# Patient Record
Sex: Female | Born: 1960 | Race: White | Hispanic: No | Smoking: Former smoker
Health system: Southern US, Community
[De-identification: ages and names within clinical notes are randomized; demographics above are authoritative.]

## PROBLEM LIST (undated history)

## (undated) DIAGNOSIS — D649 Anemia, unspecified: Secondary | ICD-10-CM

## (undated) DIAGNOSIS — C801 Malignant (primary) neoplasm, unspecified: Secondary | ICD-10-CM

## (undated) DIAGNOSIS — I219 Acute myocardial infarction, unspecified: Secondary | ICD-10-CM

## (undated) DIAGNOSIS — I5022 Chronic systolic (congestive) heart failure: Secondary | ICD-10-CM

## (undated) DIAGNOSIS — J189 Pneumonia, unspecified organism: Secondary | ICD-10-CM

## (undated) DIAGNOSIS — I2589 Other forms of chronic ischemic heart disease: Secondary | ICD-10-CM

## (undated) DIAGNOSIS — I10 Essential (primary) hypertension: Secondary | ICD-10-CM

## (undated) DIAGNOSIS — K219 Gastro-esophageal reflux disease without esophagitis: Secondary | ICD-10-CM

## (undated) DIAGNOSIS — E876 Hypokalemia: Secondary | ICD-10-CM

## (undated) DIAGNOSIS — E785 Hyperlipidemia, unspecified: Secondary | ICD-10-CM

## (undated) DIAGNOSIS — IMO0002 Reserved for concepts with insufficient information to code with codable children: Secondary | ICD-10-CM

## (undated) DIAGNOSIS — E119 Type 2 diabetes mellitus without complications: Secondary | ICD-10-CM

## (undated) DIAGNOSIS — K560 Paralytic ileus: Secondary | ICD-10-CM

## (undated) DIAGNOSIS — J449 Chronic obstructive pulmonary disease, unspecified: Secondary | ICD-10-CM

## (undated) DIAGNOSIS — I2510 Atherosclerotic heart disease of native coronary artery without angina pectoris: Secondary | ICD-10-CM

## (undated) HISTORY — DX: Hypokalemia: E87.6

## (undated) HISTORY — DX: Atherosclerotic heart disease of native coronary artery without angina pectoris: I25.10

## (undated) HISTORY — PX: ABDOMINAL HYSTERECTOMY: SHX81

## (undated) HISTORY — DX: Paralytic ileus: K56.0

## (undated) HISTORY — DX: Essential (primary) hypertension: I10

## (undated) HISTORY — PX: OTHER SURGICAL HISTORY: SHX169

## (undated) HISTORY — DX: Anemia, unspecified: D64.9

## (undated) HISTORY — DX: Other forms of chronic ischemic heart disease: I25.89

## (undated) HISTORY — PX: WISDOM TOOTH EXTRACTION: SHX21

## (undated) HISTORY — PX: CHOLECYSTECTOMY: SHX55

## (undated) HISTORY — DX: Gastro-esophageal reflux disease without esophagitis: K21.9

## (undated) HISTORY — PX: TOTAL ABDOMINAL HYSTERECTOMY W/ BILATERAL SALPINGOOPHORECTOMY: SHX83

## (undated) HISTORY — DX: Chronic systolic (congestive) heart failure: I50.22

## (undated) HISTORY — PX: BILATERAL SALPINGOOPHORECTOMY: SHX1223

## (undated) HISTORY — DX: Reserved for concepts with insufficient information to code with codable children: IMO0002

---

## 2006-10-18 HISTORY — PX: ESOPHAGOGASTRODUODENOSCOPY: SHX1529

## 2006-12-06 HISTORY — PX: COLONOSCOPY: SHX174

## 2008-05-15 DIAGNOSIS — M898X9 Other specified disorders of bone, unspecified site: Secondary | ICD-10-CM | POA: Insufficient documentation

## 2008-05-15 DIAGNOSIS — I2510 Atherosclerotic heart disease of native coronary artery without angina pectoris: Secondary | ICD-10-CM | POA: Insufficient documentation

## 2008-07-13 HISTORY — PX: CARDIAC CATHETERIZATION: SHX172

## 2008-07-19 ENCOUNTER — Ambulatory Visit: Payer: Self-pay | Admitting: Cardiovascular Disease

## 2008-07-20 ENCOUNTER — Observation Stay (HOSPITAL_COMMUNITY): Admission: EM | Admit: 2008-07-20 | Discharge: 2008-07-22 | Payer: Self-pay | Admitting: Emergency Medicine

## 2008-07-20 ENCOUNTER — Encounter: Payer: Self-pay | Admitting: Cardiology

## 2008-07-24 ENCOUNTER — Inpatient Hospital Stay (HOSPITAL_COMMUNITY): Admission: EM | Admit: 2008-07-24 | Discharge: 2008-07-31 | Payer: Self-pay | Admitting: Internal Medicine

## 2008-07-24 ENCOUNTER — Ambulatory Visit: Payer: Self-pay | Admitting: Internal Medicine

## 2008-07-25 ENCOUNTER — Encounter: Payer: Self-pay | Admitting: Internal Medicine

## 2008-08-12 DIAGNOSIS — D649 Anemia, unspecified: Secondary | ICD-10-CM

## 2008-08-12 DIAGNOSIS — K560 Paralytic ileus: Secondary | ICD-10-CM

## 2008-08-12 DIAGNOSIS — I255 Ischemic cardiomyopathy: Secondary | ICD-10-CM

## 2008-08-12 DIAGNOSIS — E876 Hypokalemia: Secondary | ICD-10-CM

## 2008-08-12 DIAGNOSIS — I498 Other specified cardiac arrhythmias: Secondary | ICD-10-CM | POA: Insufficient documentation

## 2008-08-12 DIAGNOSIS — K219 Gastro-esophageal reflux disease without esophagitis: Secondary | ICD-10-CM

## 2008-08-12 DIAGNOSIS — I10 Essential (primary) hypertension: Secondary | ICD-10-CM | POA: Insufficient documentation

## 2008-08-12 DIAGNOSIS — I2510 Atherosclerotic heart disease of native coronary artery without angina pectoris: Secondary | ICD-10-CM | POA: Insufficient documentation

## 2008-08-19 ENCOUNTER — Encounter: Payer: Self-pay | Admitting: Physician Assistant

## 2008-08-19 ENCOUNTER — Ambulatory Visit: Payer: Self-pay | Admitting: Cardiology

## 2008-08-19 DIAGNOSIS — R0989 Other specified symptoms and signs involving the circulatory and respiratory systems: Secondary | ICD-10-CM | POA: Insufficient documentation

## 2008-08-19 DIAGNOSIS — I2510 Atherosclerotic heart disease of native coronary artery without angina pectoris: Secondary | ICD-10-CM | POA: Insufficient documentation

## 2008-08-19 DIAGNOSIS — R12 Heartburn: Secondary | ICD-10-CM | POA: Insufficient documentation

## 2008-08-31 ENCOUNTER — Telehealth (INDEPENDENT_AMBULATORY_CARE_PROVIDER_SITE_OTHER): Payer: Self-pay

## 2008-09-09 ENCOUNTER — Telehealth: Payer: Self-pay | Admitting: Cardiovascular Disease

## 2008-09-10 ENCOUNTER — Telehealth: Payer: Self-pay | Admitting: Cardiovascular Disease

## 2008-09-11 ENCOUNTER — Encounter (INDEPENDENT_AMBULATORY_CARE_PROVIDER_SITE_OTHER): Payer: Self-pay

## 2008-09-14 ENCOUNTER — Telehealth: Payer: Self-pay | Admitting: Cardiovascular Disease

## 2008-09-25 ENCOUNTER — Telehealth: Payer: Self-pay | Admitting: Cardiovascular Disease

## 2008-09-30 ENCOUNTER — Encounter: Payer: Self-pay | Admitting: Cardiovascular Disease

## 2008-09-30 ENCOUNTER — Telehealth: Payer: Self-pay | Admitting: Cardiovascular Disease

## 2008-10-13 ENCOUNTER — Encounter: Payer: Self-pay | Admitting: Cardiovascular Disease

## 2008-10-15 ENCOUNTER — Encounter: Payer: Self-pay | Admitting: Cardiovascular Disease

## 2008-10-15 ENCOUNTER — Ambulatory Visit: Payer: Self-pay | Admitting: Cardiovascular Disease

## 2008-10-15 ENCOUNTER — Encounter: Admission: RE | Admit: 2008-10-15 | Discharge: 2008-10-15 | Payer: Self-pay | Admitting: Neurosurgery

## 2008-10-15 DIAGNOSIS — E785 Hyperlipidemia, unspecified: Secondary | ICD-10-CM

## 2008-10-19 DIAGNOSIS — I10 Essential (primary) hypertension: Secondary | ICD-10-CM | POA: Insufficient documentation

## 2008-10-21 ENCOUNTER — Telehealth: Payer: Self-pay | Admitting: Cardiovascular Disease

## 2008-10-21 ENCOUNTER — Encounter: Payer: Self-pay | Admitting: Cardiovascular Disease

## 2008-11-02 ENCOUNTER — Telehealth: Payer: Self-pay | Admitting: Cardiovascular Disease

## 2008-11-02 ENCOUNTER — Ambulatory Visit: Payer: Self-pay | Admitting: Internal Medicine

## 2008-11-02 DIAGNOSIS — R609 Edema, unspecified: Secondary | ICD-10-CM

## 2008-11-04 ENCOUNTER — Telehealth (INDEPENDENT_AMBULATORY_CARE_PROVIDER_SITE_OTHER): Payer: Self-pay | Admitting: *Deleted

## 2008-11-04 LAB — CONVERTED CEMR LAB
CO2: 26 meq/L (ref 19–32)
Chloride: 107 meq/L (ref 96–112)
Creatinine, Ser: 0.9 mg/dL (ref 0.4–1.2)
Potassium: 4.3 meq/L (ref 3.5–5.1)

## 2008-11-05 ENCOUNTER — Telehealth: Payer: Self-pay | Admitting: Internal Medicine

## 2008-11-11 ENCOUNTER — Ambulatory Visit: Payer: Self-pay | Admitting: Cardiovascular Disease

## 2008-11-11 ENCOUNTER — Ambulatory Visit: Payer: Self-pay

## 2008-11-11 ENCOUNTER — Encounter: Payer: Self-pay | Admitting: Cardiovascular Disease

## 2008-11-17 LAB — CONVERTED CEMR LAB
Chloride: 105 meq/L (ref 96–112)
Creatinine, Ser: 0.8 mg/dL (ref 0.4–1.2)
Glucose, Bld: 101 mg/dL — ABNORMAL HIGH (ref 70–99)
Potassium: 3.9 meq/L (ref 3.5–5.1)
Sodium: 139 meq/L (ref 135–145)

## 2008-12-03 ENCOUNTER — Encounter: Payer: Self-pay | Admitting: Cardiovascular Disease

## 2008-12-09 ENCOUNTER — Ambulatory Visit: Payer: Self-pay | Admitting: Cardiovascular Disease

## 2008-12-14 ENCOUNTER — Encounter: Payer: Self-pay | Admitting: Cardiovascular Disease

## 2008-12-16 ENCOUNTER — Encounter: Payer: Self-pay | Admitting: Cardiovascular Disease

## 2008-12-23 ENCOUNTER — Telehealth: Payer: Self-pay | Admitting: Cardiovascular Disease

## 2009-01-26 ENCOUNTER — Telehealth: Payer: Self-pay | Admitting: Cardiovascular Disease

## 2009-02-15 ENCOUNTER — Encounter: Payer: Self-pay | Admitting: Cardiovascular Disease

## 2009-02-15 ENCOUNTER — Ambulatory Visit: Payer: Self-pay

## 2009-02-15 ENCOUNTER — Ambulatory Visit: Payer: Self-pay | Admitting: Cardiovascular Disease

## 2009-02-15 ENCOUNTER — Ambulatory Visit (HOSPITAL_COMMUNITY): Admission: RE | Admit: 2009-02-15 | Discharge: 2009-02-15 | Payer: Self-pay | Admitting: Cardiovascular Disease

## 2009-02-16 ENCOUNTER — Encounter (INDEPENDENT_AMBULATORY_CARE_PROVIDER_SITE_OTHER): Payer: Self-pay | Admitting: *Deleted

## 2009-02-20 LAB — CONVERTED CEMR LAB
BUN: 10 mg/dL (ref 6–23)
Creatinine, Ser: 0.9 mg/dL (ref 0.4–1.2)
Glucose, Bld: 131 mg/dL — ABNORMAL HIGH (ref 70–99)

## 2009-02-24 ENCOUNTER — Ambulatory Visit (HOSPITAL_COMMUNITY): Admission: RE | Admit: 2009-02-24 | Discharge: 2009-02-24 | Payer: Self-pay | Admitting: Cardiovascular Disease

## 2009-02-24 ENCOUNTER — Ambulatory Visit: Payer: Self-pay | Admitting: Cardiovascular Disease

## 2009-03-01 ENCOUNTER — Telehealth (INDEPENDENT_AMBULATORY_CARE_PROVIDER_SITE_OTHER): Payer: Self-pay | Admitting: *Deleted

## 2009-03-04 ENCOUNTER — Encounter: Payer: Self-pay | Admitting: Cardiovascular Disease

## 2009-03-24 ENCOUNTER — Ambulatory Visit: Payer: Self-pay | Admitting: Internal Medicine

## 2009-04-13 ENCOUNTER — Encounter: Payer: Self-pay | Admitting: Internal Medicine

## 2009-04-14 DIAGNOSIS — Z95810 Presence of automatic (implantable) cardiac defibrillator: Secondary | ICD-10-CM

## 2009-04-14 HISTORY — PX: CARDIAC DEFIBRILLATOR PLACEMENT: SHX171

## 2009-04-14 HISTORY — DX: Presence of automatic (implantable) cardiac defibrillator: Z95.810

## 2009-05-04 ENCOUNTER — Ambulatory Visit: Payer: Self-pay | Admitting: Internal Medicine

## 2009-05-06 LAB — CONVERTED CEMR LAB
Basophils Absolute: 0 10*3/uL (ref 0.0–0.1)
Calcium: 9.1 mg/dL (ref 8.4–10.5)
Chloride: 102 meq/L (ref 96–112)
Eosinophils Absolute: 0.2 10*3/uL (ref 0.0–0.7)
Eosinophils Relative: 1.9 % (ref 0.0–5.0)
Hemoglobin: 11.8 g/dL — ABNORMAL LOW (ref 12.0–15.0)
INR: 1.1 — ABNORMAL HIGH (ref 0.8–1.0)
Lymphocytes Relative: 23.4 % (ref 12.0–46.0)
Lymphs Abs: 2.3 10*3/uL (ref 0.7–4.0)
MCHC: 33.7 g/dL (ref 30.0–36.0)
MCV: 94 fL (ref 78.0–100.0)
Monocytes Absolute: 0.5 10*3/uL (ref 0.1–1.0)
Potassium: 4.3 meq/L (ref 3.5–5.1)
RBC: 3.71 M/uL — ABNORMAL LOW (ref 3.87–5.11)
RDW: 13.2 % (ref 11.5–14.6)

## 2009-05-11 ENCOUNTER — Ambulatory Visit: Payer: Self-pay | Admitting: Internal Medicine

## 2009-05-11 ENCOUNTER — Inpatient Hospital Stay (HOSPITAL_COMMUNITY): Admission: RE | Admit: 2009-05-11 | Discharge: 2009-05-12 | Payer: Self-pay | Admitting: Internal Medicine

## 2009-05-12 ENCOUNTER — Encounter: Payer: Self-pay | Admitting: Internal Medicine

## 2009-05-18 ENCOUNTER — Encounter: Payer: Self-pay | Admitting: Internal Medicine

## 2009-05-18 ENCOUNTER — Ambulatory Visit: Payer: Self-pay | Admitting: Cardiovascular Disease

## 2009-05-18 ENCOUNTER — Encounter (INDEPENDENT_AMBULATORY_CARE_PROVIDER_SITE_OTHER): Payer: Self-pay

## 2009-05-24 ENCOUNTER — Telehealth (INDEPENDENT_AMBULATORY_CARE_PROVIDER_SITE_OTHER): Payer: Self-pay | Admitting: *Deleted

## 2009-05-31 ENCOUNTER — Telehealth (INDEPENDENT_AMBULATORY_CARE_PROVIDER_SITE_OTHER): Payer: Self-pay | Admitting: *Deleted

## 2009-06-10 ENCOUNTER — Telehealth: Payer: Self-pay | Admitting: Internal Medicine

## 2009-06-11 ENCOUNTER — Telehealth: Payer: Self-pay | Admitting: Cardiovascular Disease

## 2009-06-15 ENCOUNTER — Telehealth: Payer: Self-pay | Admitting: Cardiovascular Disease

## 2009-06-17 ENCOUNTER — Telehealth (INDEPENDENT_AMBULATORY_CARE_PROVIDER_SITE_OTHER): Payer: Self-pay | Admitting: *Deleted

## 2009-06-21 ENCOUNTER — Ambulatory Visit (HOSPITAL_COMMUNITY): Admission: RE | Admit: 2009-06-21 | Discharge: 2009-06-21 | Payer: Self-pay | Admitting: Obstetrics and Gynecology

## 2009-06-22 ENCOUNTER — Telehealth: Payer: Self-pay | Admitting: Internal Medicine

## 2009-06-29 ENCOUNTER — Telehealth: Payer: Self-pay | Admitting: Cardiovascular Disease

## 2009-06-30 ENCOUNTER — Encounter: Payer: Self-pay | Admitting: Cardiovascular Disease

## 2009-07-08 ENCOUNTER — Telehealth: Payer: Self-pay | Admitting: Cardiovascular Disease

## 2009-07-08 ENCOUNTER — Ambulatory Visit: Admission: RE | Admit: 2009-07-08 | Discharge: 2009-07-08 | Payer: Self-pay | Admitting: Gynecologic Oncology

## 2009-07-14 ENCOUNTER — Telehealth (INDEPENDENT_AMBULATORY_CARE_PROVIDER_SITE_OTHER): Payer: Self-pay | Admitting: *Deleted

## 2009-07-15 ENCOUNTER — Ambulatory Visit: Payer: Self-pay

## 2009-07-15 ENCOUNTER — Ambulatory Visit: Payer: Self-pay | Admitting: Internal Medicine

## 2009-07-15 ENCOUNTER — Encounter (HOSPITAL_COMMUNITY): Admission: RE | Admit: 2009-07-15 | Discharge: 2009-09-14 | Payer: Self-pay | Admitting: Cardiovascular Disease

## 2009-08-10 ENCOUNTER — Inpatient Hospital Stay (HOSPITAL_COMMUNITY): Admission: RE | Admit: 2009-08-10 | Discharge: 2009-08-13 | Payer: Self-pay | Admitting: Obstetrics and Gynecology

## 2009-08-10 ENCOUNTER — Encounter (INDEPENDENT_AMBULATORY_CARE_PROVIDER_SITE_OTHER): Payer: Self-pay | Admitting: Obstetrics and Gynecology

## 2009-08-17 ENCOUNTER — Ambulatory Visit: Payer: Self-pay | Admitting: Cardiovascular Disease

## 2009-08-18 ENCOUNTER — Ambulatory Visit: Admission: RE | Admit: 2009-08-18 | Discharge: 2009-08-18 | Payer: Self-pay | Admitting: Gynecology

## 2009-08-20 ENCOUNTER — Ambulatory Visit: Payer: Self-pay | Admitting: Internal Medicine

## 2009-08-23 ENCOUNTER — Encounter: Payer: Self-pay | Admitting: Cardiovascular Disease

## 2009-08-24 ENCOUNTER — Encounter: Payer: Self-pay | Admitting: Cardiovascular Disease

## 2009-09-16 ENCOUNTER — Ambulatory Visit: Admission: RE | Admit: 2009-09-16 | Discharge: 2009-09-16 | Payer: Self-pay | Admitting: Gynecologic Oncology

## 2009-09-17 ENCOUNTER — Ambulatory Visit: Payer: Self-pay | Admitting: Cardiovascular Disease

## 2009-09-28 LAB — CONVERTED CEMR LAB
Cholesterol: 140 mg/dL (ref 0–200)
Direct LDL: 71.4 mg/dL
HDL: 31.8 mg/dL — ABNORMAL LOW (ref >39.00)
Total CHOL/HDL Ratio: 4
Triglycerides: 296 mg/dL — ABNORMAL HIGH (ref 0.0–149.0)

## 2009-11-16 ENCOUNTER — Telehealth (INDEPENDENT_AMBULATORY_CARE_PROVIDER_SITE_OTHER): Payer: Self-pay | Admitting: *Deleted

## 2009-11-30 ENCOUNTER — Encounter: Payer: Self-pay | Admitting: Cardiovascular Disease

## 2009-12-01 ENCOUNTER — Encounter: Payer: Self-pay | Admitting: Cardiovascular Disease

## 2009-12-06 ENCOUNTER — Ambulatory Visit: Payer: Self-pay | Admitting: Cardiovascular Disease

## 2009-12-06 ENCOUNTER — Encounter: Payer: Self-pay | Admitting: Internal Medicine

## 2009-12-31 ENCOUNTER — Emergency Department (HOSPITAL_COMMUNITY): Admission: EM | Admit: 2009-12-31 | Discharge: 2009-12-31 | Payer: Self-pay | Admitting: Emergency Medicine

## 2010-03-02 ENCOUNTER — Ambulatory Visit: Payer: Self-pay | Admitting: Internal Medicine

## 2010-03-05 IMAGING — CR DG CHEST 2V
2 series · 2 of 2 positions shown · non-contrast
Comparison: 07/19/2008

CLINICAL DATA: Post pacemaker placement.

CHEST - 2 VIEW

[w chest pa]
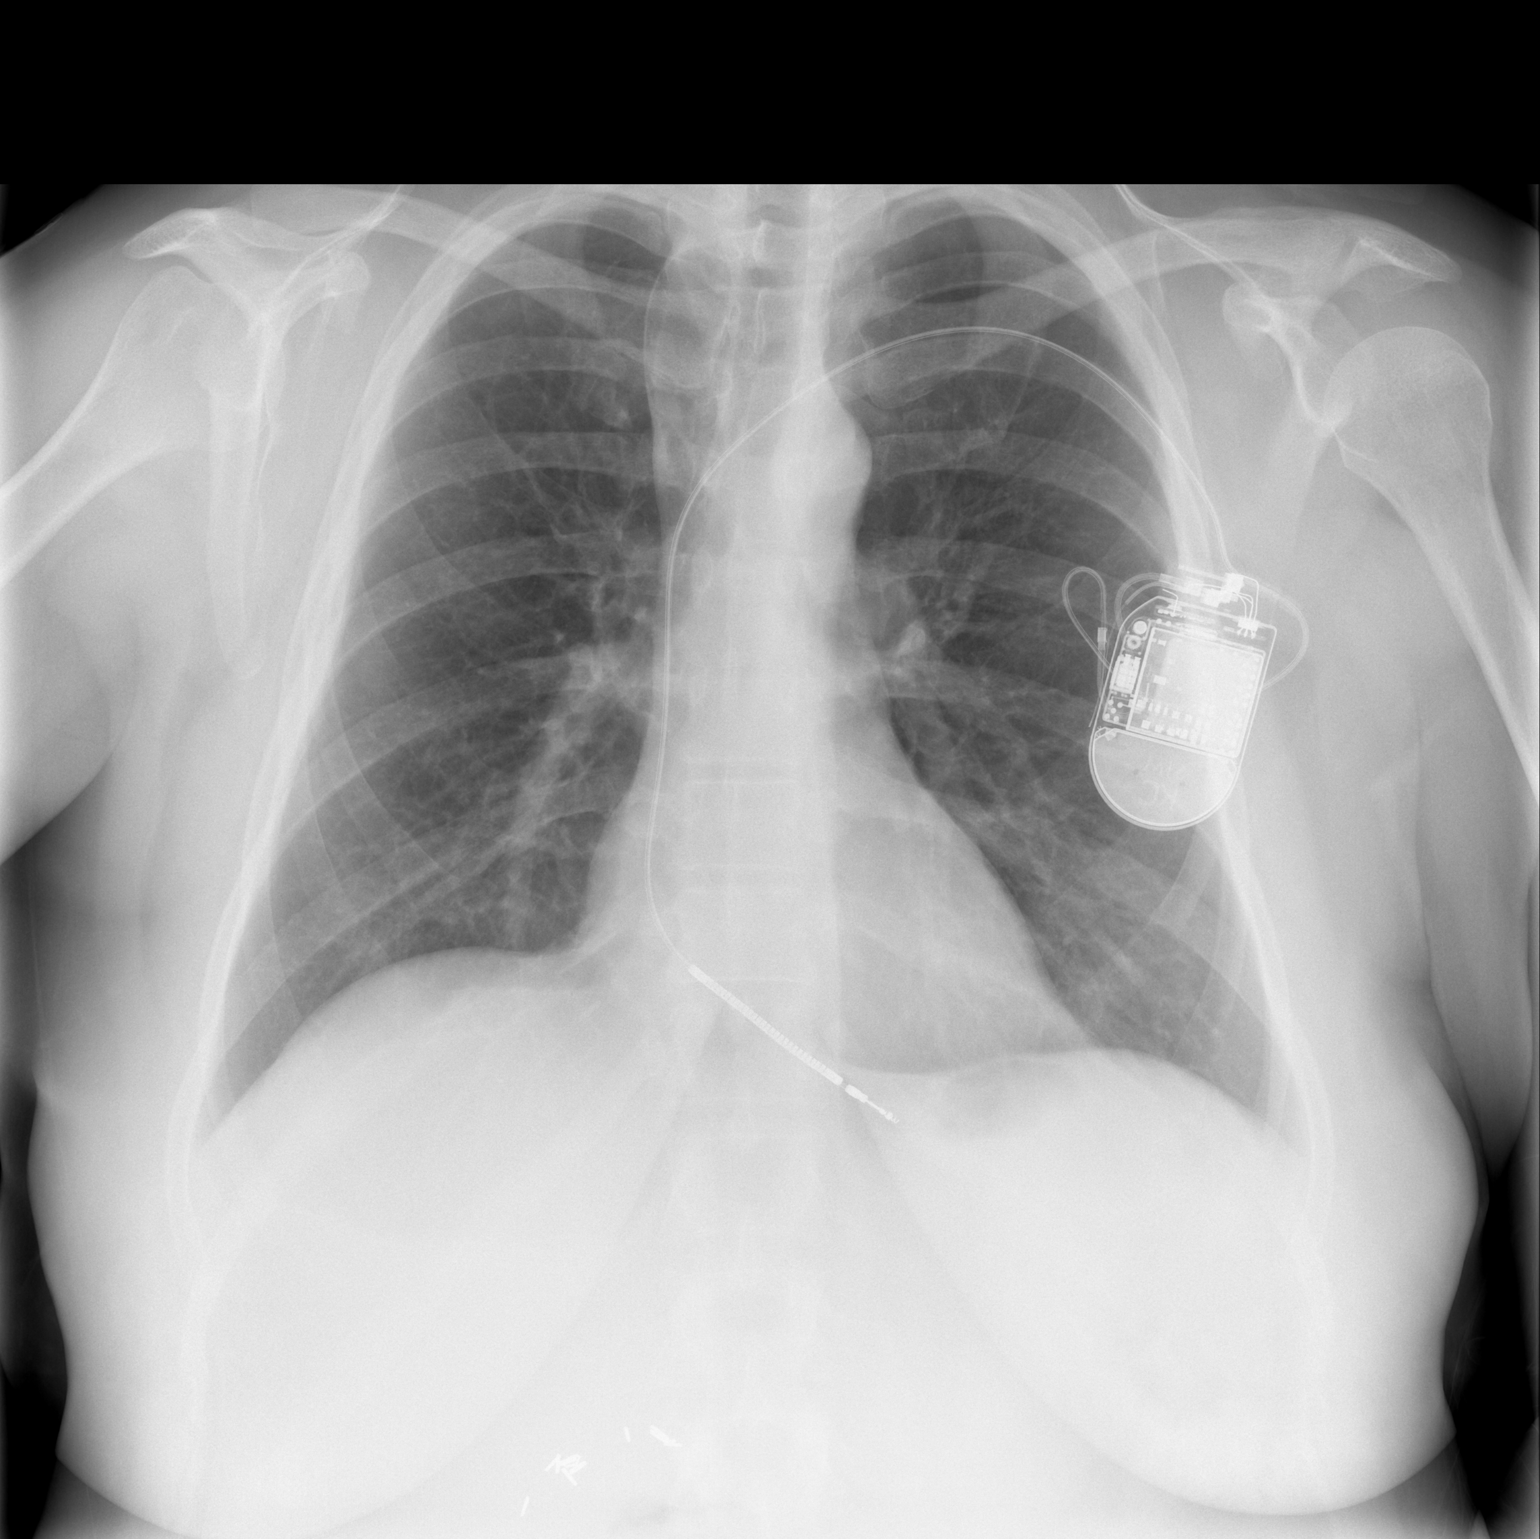

[w chest lat]
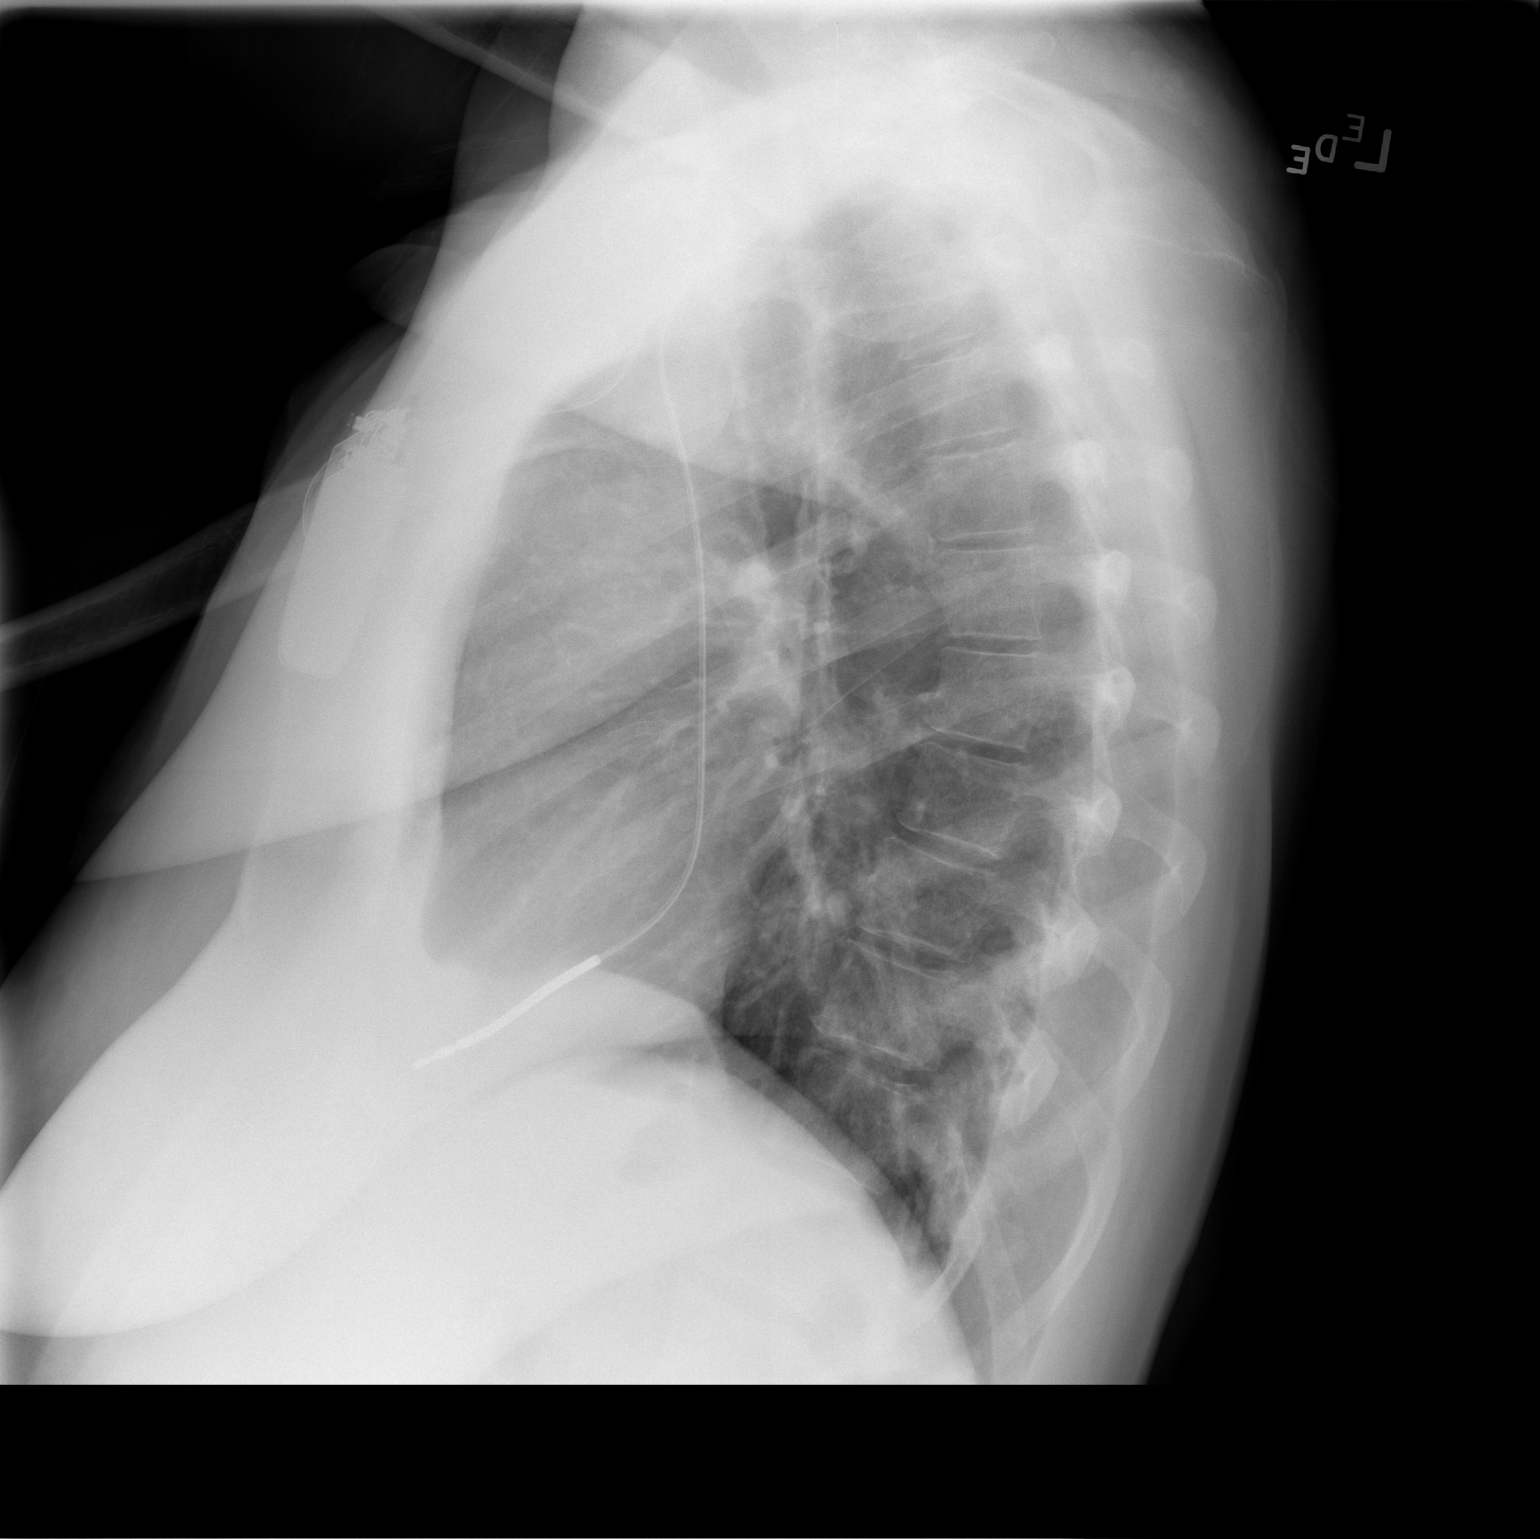

[2 of 2 positions shown; findings below may reference images not displayed]

FINDINGS: Left single lead pacer is in place with lead in the right
ventricle.  No pneumothorax.  Lungs are clear.  Heart is normal
size.  No effusions.  Incidentally noted is an azygos lobe medially
in the right upper lung.
IMPRESSION: No acute findings.  No pneumothorax post pacer placement.

## 2010-03-17 ENCOUNTER — Ambulatory Visit: Admission: RE | Admit: 2010-03-17 | Discharge: 2010-03-17 | Payer: Self-pay | Admitting: Gynecologic Oncology

## 2010-03-17 ENCOUNTER — Encounter: Payer: Self-pay | Admitting: Cardiovascular Disease

## 2010-04-04 ENCOUNTER — Encounter: Payer: Self-pay | Admitting: Cardiovascular Disease

## 2010-04-04 ENCOUNTER — Ambulatory Visit: Payer: Self-pay | Admitting: Cardiovascular Disease

## 2010-04-06 LAB — CONVERTED CEMR LAB
Alkaline Phosphatase: 74 units/L (ref 39–117)
Cholesterol: 117 mg/dL (ref 0–200)
Total CHOL/HDL Ratio: 4
Total Protein: 6.9 g/dL (ref 6.0–8.3)

## 2010-04-17 ENCOUNTER — Emergency Department (HOSPITAL_COMMUNITY)
Admission: EM | Admit: 2010-04-17 | Discharge: 2010-04-17 | Payer: Self-pay | Source: Home / Self Care | Admitting: Emergency Medicine

## 2010-04-21 ENCOUNTER — Emergency Department (HOSPITAL_COMMUNITY): Admission: EM | Admit: 2010-04-21 | Discharge: 2009-07-29 | Payer: Self-pay | Admitting: Emergency Medicine

## 2010-06-02 ENCOUNTER — Encounter: Payer: Self-pay | Admitting: Internal Medicine

## 2010-06-02 ENCOUNTER — Ambulatory Visit: Admission: RE | Admit: 2010-06-02 | Discharge: 2010-06-02 | Payer: Self-pay | Source: Home / Self Care

## 2010-06-16 NOTE — Progress Notes (Signed)
Summary: RE PHONE PACER CHECK-PT DOESN'T HAVE A TRANSMITTER  Phone Note Call from Patient   Caller: Patient Reason for Call: Talk to Nurse Summary of Call: pt states she recieved a call from our number,she doesn't have a answering machine so there wasn't a message, thought we might have called to remind her of her appt-it is for a phone pacer check,however she states she doesn't have a transmitter, should she just come here for a check? pls call [phone] Initial call taken by: Glynda Jaeger,  November 16, 2009 1:32 PM  Follow-up for Phone Call        patient will call and schedule in office checks and we will cancel Merlin. Follow-up by: Altha Harm, LPN,  November 16, 1476 2:27 PM

## 2010-06-16 NOTE — Cardiovascular Report (Signed)
Summary: Office Visit   Office Visit   Imported By: Roderic Ovens 08/24/2009 16:40:58  _____________________________________________________________________  External Attachment:    Type:   Image     Comment:   External Document

## 2010-06-16 NOTE — Letter (Signed)
Summary: Return To Work  [employer], Main Office  [address]   [place], [place] [address]   Phone: [phone]  Fax: [phone]    05/18/2009  TO: WHOM IT MAY CONCERN   RE: Emily Mays [address] [place]   The above named individual is under my medical care and may return to work on: Monday May 24, 2009 with restrictions.  The patient cannot lift greater than 10 pounds the week of January 10-16, 2011.  The patient cannot lift greater than 20 pounds the week of January 17-23, 2011.  The patient can resume normal job functions on June 07, 2009.  If you have any further questions or need additional information, please call.     Sincerely,    Veverly Fells. Excell Seltzer MD Julieta Gutting, RN, BSN

## 2010-06-16 NOTE — Progress Notes (Signed)
Summary: lifiting restriction   Phone Note Call from Patient Call back at Home Phone [phone]   Caller: Patient Reason for Call: Talk to Nurse Details for Reason: pacermaker put in 05/11/09. is pt still on lifting restriction  Initial call taken by: Lorne Skeens,  June 10, 2009 11:47 AM  Follow-up for Phone Call        no restrictions on wt at this time. device was placed 05/11/2009 Follow-up by: Charolotte Capuchin, RN

## 2010-06-16 NOTE — Consult Note (Signed)
Summary: [facility]   Imported By: Roderic Ovens 03/28/2010 09:28:13  _____________________________________________________________________  External Attachment:    Type:   Image     Comment:   External Document

## 2010-06-16 NOTE — Progress Notes (Signed)
Summary: what was her sugar level when defib was put in  Phone Note Call from Patient Call back at Home Phone [phone]   Caller: Patient Reason for Call: Talk to Nurse, Talk to Doctor Summary of Call: pt had a D & C done and her sugar level was high and she was wondering was it high when she had her defib put in Initial call taken by: Omer Jack,  June 22, 2009 1:23 PM  Follow-up for Phone Call        blood sugar 172 but she was not fasting.  She is seeing her primary on Mon Dennis Bast, RN, BSN  June 22, 2009 1:34 PM

## 2010-06-16 NOTE — Cardiovascular Report (Signed)
Summary: Office Visit   Office Visit   Imported By: Roderic Ovens 05/27/2009 14:08:03  _____________________________________________________________________  External Attachment:    Type:   Image     Comment:   External Document

## 2010-06-16 NOTE — Progress Notes (Signed)
  Walk in Patient Form Recieved " patient left FMLA papers forwarded to [employer]. [facility]  May 24, 2009 2:35 PM

## 2010-06-16 NOTE — Assessment & Plan Note (Signed)
Summary: 4 month rov   Visit Type:  4 months follow up Referring Provider:  Tonny Bollman, MD Primary Provider:  Dr. Fredric Dine  CC:  No complaints.  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 She also required stenting of the LCx at that time. Post MI LVEF is in range of 30-35% and she underwent ICD implant January 2011. She presents today for followup evaluation.  She feels well overall. Complains of mild exercise intolerance but no frank dyspnea or chest pain. No edema, orthopnea, or PND.    Current Medications (verified): 1)  Plavix 75 Mg Tabs (Clopidogrel Bisulfate) .... One Daily 2)  Aspirin 81 Mg Tbec (Aspirin) .... Take One Tablet By Mouth Daily 3)  Nitrostat 0.4 Mg Subl (Nitroglycerin) .... As Needed 4)  Digoxin 0.25 Mg Tabs (Digoxin) .... Take One Daily 5)  Crestor 20 Mg Tabs (Rosuvastatin Calcium) .... Take One Tablet By Mouth Daily. 6)  Carvedilol 12.5 Mg Tabs (Carvedilol) .... Take One Tablet By Mouth Twice A Day 7)  Alprazolam 0.5 Mg Tabs (Alprazolam) .... As Needed 8)  Furosemide 40 Mg Tabs (Furosemide) .... Take One Tablet By Mouth Daily. 9)  Potassium Chloride Cr 10 Meq Cr-Caps (Potassium Chloride) .... Take One Tablet By Mouth Daily 10)  Oxycodone-Acetaminophen 5-325 Mg Tabs (Oxycodone-Acetaminophen) .... As Needed 11)  Diovan 40 Mg Tabs (Valsartan) .... Take One Tablet By Mouth Daily 12)  Glipizide 10 Mg Tabs (Glipizide) .... Take 1 Tablet By Mouth Once A Day 13)  Janumet 50-1000 Mg Tabs (Sitagliptin-Metformin Hcl) .... Take One Tab By Mouth Once Daily  Allergies: 1)  ! Pcn  Past History:  Past medical history reviewed for relevance to current acute and chronic problems.  Past Medical History: ANEMIA (ICD-285.9) ISCHEMIC CARDIOMYOPATHY (ICD-414.8)      a.  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  s/p MI 07/24/08 with PCI Endeavor DES to Prox. LAD and BMS to prox. LCX.      b.  relook cath 07/28/08 showing cont. patency of LAD and LCX. Systolic HF, chronic DDD  Review of Systems       Negative except as per HPI   Vital Signs:  Patient profile:   50 year old female Height:      64 inches Weight:      177.25 pounds BMI:     30.53 Pulse rate:   75 / minute Pulse rhythm:   regular Resp:     18 per minute BP sitting:   114 / 80  (left arm) Cuff size:   large  Vitals Entered By: Vikki Ports (April 04, 2010 9:11 AM)  Physical Exam  General:  Pt is alert and oriented, in no acute distress. HEENT: normal Neck: normal carotid upstrokes without bruits, JVP normal Lungs: CTA CV: RRR without murmur or gallop Abd: soft, NT, positive BS, no bruit, no organomegaly Ext: no clubbing, cyanosis, or edema. peripheral pulses 2+ and equal Skin: warm and dry without rash    EKG  Procedure date:  04/04/2010  Findings:      NSR with age-indeterminate anterior MI, unchanged from prior tracing   ICD Specifications Following MD:  Hillis Range, MD     Referring MD:  Excell Seltzer ICD Vendor:  St Jude     ICD Model Number:  1231-40     ICD Serial Number:  [id number] ICD DOI:  05/11/2009     ICD Implanting MD:  Hillis Range, MD  Lead 1:    Location: RV     DOI: 05/11/2009     Model #: [id number]     Serial #: [id number]     Status: active  Indications::  ICM   ICD Follow Up ICD Dependent:  No      Brady Parameters Mode VVI     Lower Rate Limit:  40      Tachy Zones VF:  200     VT:  171 (MONITOR)     Impression & Recommendations:  Problem # 1:  CAD, NATIVE VESSEL (ICD-414.01) Pt stable without angina or CHF symptoms. She is s/p ICD without device therapies. Will continue her current program and increase carvedilol to a goal dose of 25 mg two times a day.  Her updated medication list for this  problem includes:    Plavix 75 Mg Tabs (Clopidogrel bisulfate) ..... One daily    Aspirin 81 Mg Tbec (Aspirin) .Marland Kitchen... Take one tablet by mouth daily    Nitrostat 0.4 Mg Subl (Nitroglycerin) .Marland Kitchen... As needed    Carvedilol 25 Mg Tabs (Carvedilol) .Marland Kitchen... Take one tablet by mouth twice a day  Orders: EKG w/ Interpretation (93000) TLB-Lipid Panel (80061-LIPID) TLB-Hepatic/Liver Function Pnl (80076-HEPATIC)  Problem # 2:  HYPERLIPIDEMIA-MIXED (ICD-272.4) Check lipids and LFT's today. Continue Crestor 20 mg pending those results.  Her updated medication list for this problem includes:    Crestor 20 Mg Tabs (Rosuvastatin calcium) .Marland Kitchen... Take one tablet by mouth daily.  CHOL: 140 (09/17/2009)   HDL: 31.80 (09/17/2009)   TG: 296.0 (09/17/2009)  Orders: EKG w/ Interpretation (93000) TLB-Lipid Panel (80061-LIPID) TLB-Hepatic/Liver Function Pnl (80076-HEPATIC)  Problem # 3:  HYPERTENSION, BENIGN (ICD-401.1) Controlled.  Her updated medication list for this problem includes:    Aspirin 81 Mg Tbec (Aspirin) .Marland Kitchen... Take one tablet by mouth daily    Carvedilol 25 Mg Tabs (Carvedilol) .Marland Kitchen... Take one tablet by mouth twice a day    Furosemide 40 Mg Tabs (Furosemide) .Marland Kitchen... Take one tablet by mouth daily.    Diovan 40 Mg Tabs (Valsartan) .Marland Kitchen... Take one tablet by mouth daily  Orders: EKG w/ Interpretation (93000) TLB-Lipid Panel (80061-LIPID) TLB-Hepatic/Liver Function Pnl (80076-HEPATIC)  BP today: 114/80 Prior BP: 94/70 (03/02/2010)  Prior 10 Yr Risk Heart Disease: N/A (10/15/2008)  Labs Reviewed: K+: 4.3 (05/04/2009) Creat: : 0.9 (05/04/2009)   Chol: 140 (09/17/2009)   HDL: 31.80 (09/17/2009)   TG: 296.0 (09/17/2009)  Problem # 4:  ISCHEMIC CARDIOMYOPATHY (ICD-414.8) Pt on ARB, coreg, and digoxin. s/p ICD for severe LV dysfunction.  Her updated medication list for this problem includes:    Plavix 75 Mg Tabs (Clopidogrel bisulfate) ..... One daily    Aspirin 81 Mg Tbec (Aspirin) .Marland Kitchen... Take  one tablet by mouth daily    Nitrostat 0.4 Mg Subl (Nitroglycerin) .Marland Kitchen... As needed    Digoxin 0.25 Mg Tabs (Digoxin) .Marland Kitchen... Take one daily    Carvedilol 25 Mg Tabs (Carvedilol) .Marland Kitchen... Take one tablet by mouth twice a day    Furosemide 40 Mg Tabs (Furosemide) .Marland Kitchen... Take one tablet by mouth daily.    Diovan 40 Mg Tabs (Valsartan) .Marland Kitchen... Take one tablet by mouth daily  Patient Instructions: 1)  Your physician recommends that you have a FASTING Lipid and Liver Profile today.   2)  Your physician has recommended you make the following change in  your medication: INCREASE Carvedilol to 25mg  two times a day  3)  Your physician wants you to follow-up in: 6 MONTHS.   You will receive a reminder letter in the mail two months in advance. If you don't receive a letter, please call our office to schedule the follow-up appointment. Prescriptions: CARVEDILOL 25 MG TABS (CARVEDILOL) Take one tablet by mouth twice a day  #60 x 8   Entered by:   Julieta Gutting, RN, BSN   Authorized by:   Norva Karvonen, MD   Signed by:   Julieta Gutting, RN, BSN on 04/04/2010   Method used:   Electronically to        [facility]  [address] [phone][facility] (retail)       [address]       [place], [place]  [address]       Ph: [phone] or [phone]       Fax: [phone]   RxID:   [id number]

## 2010-06-16 NOTE — Assessment & Plan Note (Signed)
Summary: Cardiology Nuclear Study  Nuclear Med Background Indications for Stress Test: Evaluation for Ischemia, Surgical Clearance, Stent Patency  Indications Comments: Pending Hysterectomy 08/03/09  History: Defibrillator, Echo, GXT, Heart Catheterization, Myocardial Infarction, Stents  History Comments: 3/10 AWMI secondary to LAD Dissection> Stents-LAD, CFX; 3/10 ECHO:EF=40-50%; 6/10 NWG:NFAOZHYQ; 12/10 ICD  Symptoms: DOE    Nuclear Pre-Procedure Cardiac Risk Factors: Family History - CAD, History of Smoking, Hypertension, Lipids, NIDDM Caffeine/Decaff Intake: None NPO After: 8:00 PM Lungs: Clear.  O2 Sat 98% on RA. IV 0.9% NS with Angio Cath: 20g     IV Site: (R) AC IV Started by: Irean Hong RN Chest Size (in) 40     Cup Size DD     Height (in): 64 Weight (lb): 179 BMI: 30.84  Nuclear Med Study 1 or 2 day study:  1 day     Stress Test Type:  Adenosine Reading MD:  Dietrich Pates, MD     Referring MD:  Tonny Bollman, MD Resting Radionuclide:  Technetium 58m Tetrofosmin     Resting Radionuclide Dose:  11.0 mCi  Stress Radionuclide:  Technetium 72m Tetrofosmin     Stress Radionuclide Dose:  33.0 mCi   Stress Protocol  Dose of Adenosine:  45.6 mg    Stress Test Technologist:  [employer]a [employer] CMA-N     Nuclear Technologist:  Harlow Asa CNMT  Rest Procedure  Myocardial perfusion imaging was performed at rest 45 minutes following the intravenous administration of Myoview Technetium 63m Tetrofosmin.  Stress Procedure  The patient received IV adenosine at 140 mcg/kg/min for 4 minutes. There were episodes of 2nd degree AVB with infusion. Myoview was injected at the 2 minute mark and quantitative spect images were obtained after a 45 minute delay.  QPS Raw Data Images:  Extensive soft tissue (breast, diaphragm, bowel acitvity) surround heart. Stress Images:  Large defect in the anterior wall (mid,distal), anteroseptal wall (base, mid, distal), anterolateral wall (distal),  inferolateral wall (distal), inferior wall (mid, distal) and apex. Rest Images:  No significant change from the stress images. Subtraction (SDS):  Normal Transient Ischemic Dilatation:  .95  (Normal <1.22)  Lung/Heart Ratio:  .40  (Normal <0.45)  Quantitative Gated Spect Images QGS EDV:  133 ml QGS ESV:  80 ml QGS EF:  40 %   Overall Impression  Exercise Capacity: Adenosine study with no exercise. BP Response: Normal blood pressure response. Clinical Symptoms: No chest pain ECG Impression: No significant ST segment change suggestive of ischemia. Overall Impression Comments: Extensive scar in the anterior, anterolateral, anteroseptal, inferior, inferolateral and apical walls as noted above.  No ischemia.  LVEF on gating calculated at 40%.  Would recommend echo if not done already to better define wall motion, LVEF.  Appended Document: Cardiology Nuclear Study myoview shows scar from prior infarct, but no ischemia. OK to proceed with surgery.  Appended Document: Cardiology Nuclear Study Pt aware of results by phone.  Pt does not know the details of pending surgery.  I made the pt aware that our office has not received any documentation requesting clearance for surgery.  The pt is also interested in doing Cardiac Rehab at [facility] after recovering from surgery.  The pt will call the office once she knows the details of her surgery.

## 2010-06-16 NOTE — Assessment & Plan Note (Signed)
Summary: 3 month rov   Visit Type:  3 months follow up Referring Provider:  Tonny Bollman, MD Primary Provider:  Dr. Fredric Dine  CC:  No complains.  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 She also required stenting of the LCx at that time. Post Mi LVEF is in range of 30-35% and she underwent ICD implant last week. Today is doing well and has no complaints. The patient denies chest pain, dyspnea, orthopnea, PND, edema, palpitations, lightheadedness, or syncope.   Current Medications (verified): 1)  Plavix 75 Mg Tabs (Clopidogrel Bisulfate) .... One Daily 2)  Fish Oil Concentrate 1000 Mg Caps (Omega-3 Fatty Acids) .... One Daily 3)  Aspirin 325 Mg Tabs (Aspirin) .... Take 1 Tab By Mouth Every Day 4)  Nitrostat 0.4 Mg Subl (Nitroglycerin) .... As Needed 5)  Digoxin 0.25 Mg Tabs (Digoxin) .... Take One Daily 6)  Famotidine 40 Mg Tabs (Famotidine) .... Take One Tablet By Mouth Twice A Day 7)  Crestor 40 Mg Tabs (Rosuvastatin Calcium) .... One Daily 8)  Carvedilol 6.25 Mg Tabs (Carvedilol) .... Take One Tablet By Mouth Twice A Day 9)  Alprazolam 0.5 Mg Tabs (Alprazolam) .... As Needed 10)  Furosemide 40 Mg Tabs (Furosemide) .... Take One Tablet By Mouth Daily. 11)  Potassium Chloride Cr 10 Meq Cr-Caps (Potassium Chloride) .... Take One Tablet By Mouth Daily 12)  Oxycodone-Acetaminophen 5-325 Mg Tabs (Oxycodone-Acetaminophen) .... As Needed 13)  Diovan 40 Mg Tabs (Valsartan) .... Take One Tablet By Mouth Daily 14)  Ciprofloxacin Hcl 500 Mg Tabs (Ciprofloxacin Hcl) .... Take 1 Tablet By Mouth Two Times A Day For 7 Days  Allergies: 1)  ! Pcn  Past History:  Past medical history reviewed for relevance to current acute and chronic problems.  Past Medical History: Reviewed history from 03/24/2009 and no changes required. 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  Echo 07/26/2008 EF 40-50%  with perapical HK HYPOKALEMIA (ICD-276.8) HYPERTENSION (ICD-401.9) GERD (ICD-530.81) ILEUS (ICD-560.1) CAD (ICD-414.00)      a.  s/p MI 07/24/08 with PCI Endeavor DES to Prox. LAD and BMS to prox. LCX.      b.  relook cath 07/28/08 showing cont. patency of LAD and LCX. LOWER EXTREMITY EDEMA DDD  Review of Systems       positive for vaginal bleeding, otherwise negative except as per HPI   Vital Signs:  Patient profile:   50 year old female Height:      64 inches Weight:      189.50 pounds BMI:     32.65 Pulse rate:   88 / minute Pulse rhythm:   regular Resp:     18 per minute BP sitting:   95 / 70  (right arm) Cuff size:   large  Vitals Entered By: Vikki Ports (May 18, 2009 10:41 AM)  Physical Exam  General:  Pt is alert and oriented, in no acute distress. HEENT: normal Neck: normal carotid upstrokes without bruits, JVP normal Lungs: CTA chest: ICD site intact, steri-strips in place without erythema CV: RRR without murmur or gallop Abd: soft, NT, positive BS, no bruit, no organomegaly Ext: no clubbing, cyanosis, or edema. peripheral pulses 2+ and equal Skin: warm and dry without rash    EKG  Procedure date:  05/18/2009  Findings:      NSR with septal MI age-indeterminate, HR 88 bpm   ICD Specifications Following MD:  Fayrene Fearing  Allred, MD     Referring MD:  Kynsie Falkner ICD Vendor:  St Jude     ICD Model Number:  [id number]     ICD Serial Number:  [id number] ICD DOI:  05/11/2009     ICD Implanting MD:  Hillis Range, MD  Lead 1:    Location: RV     DOI: 05/11/2009     Model #: [id number]     Serial #: [id number]     Status: active  Indications::  ICM   Impression & Recommendations:  Problem # 1:  ISCHEMIC CARDIOMYOPATHY (ICD-414.8) Pt stable s/p ICD implant. Device interrogation shows increased HR average in 90's - increase carvedilol to 12.5 mg two times a day  Her updated medication list for this problem includes:    Plavix 75 Mg Tabs (Clopidogrel bisulfate) ..... One  daily    Aspirin 325 Mg Tabs (Aspirin) .Marland Kitchen... Take 1 tab by mouth every day    Nitrostat 0.4 Mg Subl (Nitroglycerin) .Marland Kitchen... As needed    Digoxin 0.25 Mg Tabs (Digoxin) .Marland Kitchen... Take one daily    Carvedilol 12.5 Mg Tabs (Carvedilol) .Marland Kitchen... Take one tablet by mouth twice a day    Furosemide 40 Mg Tabs (Furosemide) .Marland Kitchen... Take one tablet by mouth daily.    Diovan 40 Mg Tabs (Valsartan) .Marland Kitchen... Take one tablet by mouth daily  Problem # 2:  CAD, NATIVE VESSEL (ICD-414.01) stable post-MI without angina. check myoview stress in March 2011 when she is one year out from her infarct because of high-risk coronary anatomy following stenting of both LAD and LCx.  Her updated medication list for this problem includes:    Plavix 75 Mg Tabs (Clopidogrel bisulfate) ..... One daily    Aspirin 325 Mg Tabs (Aspirin) .Marland Kitchen... Take 1 tab by mouth every day    Nitrostat 0.4 Mg Subl (Nitroglycerin) .Marland Kitchen... As needed    Carvedilol 12.5 Mg Tabs (Carvedilol) .Marland Kitchen... Take one tablet by mouth twice a day  Orders: EKG w/ Interpretation (93000) Nuclear Stress Test (Nuc Stress Test)  Problem # 3:  HYPERTENSION, BENIGN (ICD-401.1) stable on current meds Her updated medication list for this problem includes:    Aspirin 325 Mg Tabs (Aspirin) .Marland Kitchen... Take 1 tab by mouth every day    Carvedilol 12.5 Mg Tabs (Carvedilol) .Marland Kitchen... Take one tablet by mouth twice a day    Furosemide 40 Mg Tabs (Furosemide) .Marland Kitchen... Take one tablet by mouth daily.    Diovan 40 Mg Tabs (Valsartan) .Marland Kitchen... Take one tablet by mouth daily  Orders: EKG w/ Interpretation (93000) Nuclear Stress Test (Nuc Stress Test)  BP today: 95/70 Prior BP: 106/78 (03/24/2009)  Prior 10 Yr Risk Heart Disease: N/A (10/15/2008)  Labs Reviewed: K+: 4.3 (05/04/2009) Creat: : 0.9 (05/04/2009)     Problem # 4:  HYPERLIPIDEMIA-MIXED (ICD-272.4) lipids followed by PCP  Her updated medication list for this problem includes:    Crestor 40 Mg Tabs (Rosuvastatin calcium) ..... One  daily  Patient Instructions: 1)  Your physician recommends that you schedule a follow-up appointment in: April 2011 with Dr Excell Seltzer and Dr Johney Frame 2)  Your physician has requested that you have an exercise stress myoview in March 2011.  For further information please visit [email or web address]  Please follow instruction sheet, as given. 3)  Your physician deems you medically cleared to return to work.  A return to work note was provided today. 4)  Your physician has recommended you make the following change in your medication: INCREASE Carvedilol to 12.5mg  one tablet by  mouth twice a day Prescriptions: CARVEDILOL 12.5 MG TABS (CARVEDILOL) Take one tablet by mouth twice a day  #60 x 11   Entered by:   Julieta Gutting, RN, BSN   Authorized by:   Norva Karvonen, MD   Signed by:   Julieta Gutting, RN, BSN on 05/18/2009   Method used:   [facility]ron[facility] to        [facility]  [address] [phone]* (retail)       [address]       [place], [place]  [address]       Ph: [phone] or [phone]       Fax: [phone]   RxID:   [id number]

## 2010-06-16 NOTE — Progress Notes (Signed)
  Recieved ROI through fax, forwarded to [employer].. is for Disability?Emily Mays  May 31, 2009 9:31 AM

## 2010-06-16 NOTE — Assessment & Plan Note (Signed)
Summary: Emily Mays   Visit Type:  Follow-up Referring Provider:  Tonny Bollman, MD Primary Provider:  Dr. Fredric Dine  CC:  No cardiac complaints..  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 She also required stenting of the LCx at that time. Post MI LVEF is in range of 30-35% and she underwent ICD implant January 2011. She presents today for followup evaluation.  She is doing well today without specific cardiac complaints. The patient denies chest pain, dyspnea, orthopnea, PND, edema, palpitations, lightheadedness, or syncope.  She does have fatigue and lots of stress related to work issues - she works 12 hour shifts. She is not engaged in regular exercise.     Current Medications (verified): 1)  Plavix 75 Mg Tabs (Clopidogrel Bisulfate) .... One Daily 2)  Fish Oil Concentrate 1000 Mg Caps (Omega-3 Fatty Acids) .... One Daily 3)  Aspirin 325 Mg Tabs (Aspirin) .... Take 1 Tab By Mouth Every Day 4)  Nitrostat 0.4 Mg Subl (Nitroglycerin) .... As Needed 5)  Digoxin 0.25 Mg Tabs (Digoxin) .... Take One Daily 6)  Crestor 40 Mg Tabs (Rosuvastatin Calcium) .... One Daily 7)  Carvedilol 12.5 Mg Tabs (Carvedilol) .... Take One Tablet By Mouth Twice A Day 8)  Alprazolam 0.5 Mg Tabs (Alprazolam) .... As Needed 9)  Furosemide 40 Mg Tabs (Furosemide) .... Take One Tablet By Mouth Daily. 10)  Potassium Chloride Cr 10 Meq Cr-Caps (Potassium Chloride) .... Take One Tablet By Mouth Daily 11)  Oxycodone-Acetaminophen 5-325 Mg Tabs (Oxycodone-Acetaminophen) .... As Needed 12)  Diovan 40 Mg Tabs (Valsartan) .... Take One Tablet By Mouth Daily 13)  Glipizide 10 Mg Tabs (Glipizide) .... Take 1 Tablet By Mouth Once A Day 14)  Janumet 50-1000 Mg Tabs (Sitagliptin-Metformin Hcl) .... Take One Tab By Mouth Once Daily  Allergies (verified): 1)  ! Pcn  Past History:  Past medical history reviewed for relevance to current acute and  chronic problems.  Past Medical History: ANEMIA (ICD-285.9) ISCHEMIC CARDIOMYOPATHY (ICD-414.8)      a.  Echo 07/26/2008 EF 30% with perapical HK HYPOKALEMIA (ICD-276.8) HYPERTENSION (ICD-401.9) GERD (ICD-530.81) ILEUS (ICD-560.1) CAD (ICD-414.00) - Spontaneous LAD dissection presenting wiht anterior STEMI      a.  s/p MI 07/24/08 with PCI Endeavor DES to Prox. LAD and BMS to prox. LCX.      b.  relook cath 07/28/08 showing cont. patency of LAD and LCX. Systolic HF, chronic DDD  Review of Systems       Negative except as per HPI   Vital Signs:  Patient profile:   50 year old female Height:      64 inches Weight:      175.50 pounds BMI:     30.23 Pulse rate:   78 / minute Pulse rhythm:   regular Resp:     18 per minute BP sitting:   100 / 62  (left arm) Cuff size:   large  Vitals Entered By: Sherri Rad, RN, BSN (December 06, 2009 9:13 AM)  Physical Exam  General:  Pt is alert and oriented, in no acute distress. HEENT: normal Neck: normal carotid upstrokes without bruits, JVP normal Lungs: CTA CV: RRR without murmur or gallop Abd: soft, NT, positive BS, no bruit, no organomegaly Ext: no clubbing, cyanosis, or edema. peripheral pulses 2+ and equal Skin: warm and dry without rash     ICD Specifications Following MD:  Hillis Range, MD     Referring MD:  Jhamari Markowicz ICD Vendor:  St Jude     ICD Model Number:  [id number]     ICD Serial Number:  [id number] ICD DOI:  05/11/2009     ICD Implanting MD:  Hillis Range, MD  Lead 1:    Location: RV     DOI: 05/11/2009     Model #: [id number]     Serial #: [id number]     Status: active  Indications::  ICM   ICD Follow Up ICD Dependent:  No      Brady Parameters Mode VVI     Lower Rate Limit:  40      Tachy Zones VF:  200     VT:  171 (MONITOR)     Impression & Recommendations:  Problem # 1:  CAD, NATIVE VESSEL (ICD-414.01) Stable without angina. Recommend continue dual antiplatelet Rx but decrease ASA to 81 mg for long-term  management.  Her updated medication list for this problem includes:    Plavix 75 Mg Tabs (Clopidogrel bisulfate) ..... One daily    Aspirin 81 Mg Tbec (Aspirin) .Marland Kitchen... Take one tablet by mouth daily    Nitrostat 0.4 Mg Subl (Nitroglycerin) .Marland Kitchen... As needed    Carvedilol 12.5 Mg Tabs (Carvedilol) .Marland Kitchen... Take one tablet by mouth twice a day  Problem # 2:  ISCHEMIC CARDIOMYOPATHY (ICD-414.8) Stable and clinically euvolemic. Continue beta-blocker/ARB/Digoxin.  Her updated medication list for this problem includes:    Plavix 75 Mg Tabs (Clopidogrel bisulfate) ..... One daily    Aspirin 81 Mg Tbec (Aspirin) .Marland Kitchen... Take one tablet by mouth daily    Nitrostat 0.4 Mg Subl (Nitroglycerin) .Marland Kitchen... As needed    Digoxin 0.25 Mg Tabs (Digoxin) .Marland Kitchen... Take one daily    Carvedilol 12.5 Mg Tabs (Carvedilol) .Marland Kitchen... Take one tablet by mouth twice a day    Furosemide 40 Mg Tabs (Furosemide) .Marland Kitchen... Take one tablet by mouth daily.    Diovan 40 Mg Tabs (Valsartan) .Marland Kitchen... Take one tablet by mouth daily  Problem # 3:  HYPERLIPIDEMIA-MIXED (ICD-272.4) LDL 71 mg/dL at recent check. Continue current Rx.  Her updated medication list for this problem includes:    Crestor 20 Mg Tabs (Rosuvastatin calcium) .Marland Kitchen... Take one tablet by mouth daily.  CHOL: 140 (09/17/2009)   HDL: 31.80 (09/17/2009)   TG: 296.0 (09/17/2009)  Patient Instructions: 1)  Decrease Aspirin to 81mg  once daily. 2)  Decrease Crestor to 20mg  once daily. 3)  Your physician recommends that you schedule a follow-up appointment in: 4 months- November. 4)  Please keep your appointment on 01/14/10 @ 9:20am with Dr. Johney Frame to followup on your device.

## 2010-06-16 NOTE — Assessment & Plan Note (Signed)
Summary: df2 per pt call/lg   Visit Type:  Follow-up Referring Provider:  Tonny Bollman, MD Primary Provider:  Dr. Fredric Dine   History of Present Illness: The patient presents today for routine electrophysiology followup. She reports doing very well since last being seen in our clinic. The patient denies symptoms of palpitations, chest pain, shortness of breath, orthopnea, PND, lower extremity edema, dizziness, presyncope, syncope, or neurologic sequela. The patient is tolerating medications without difficulties and is otherwise without complaint today.   Current Medications (verified): 1)  Plavix 75 Mg Tabs (Clopidogrel Bisulfate) .... One Daily 2)  Aspirin 81 Mg Tbec (Aspirin) .... Take One Tablet By Mouth Daily 3)  Nitrostat 0.4 Mg Subl (Nitroglycerin) .... As Needed 4)  Digoxin 0.25 Mg Tabs (Digoxin) .... Take One Daily 5)  Crestor 20 Mg Tabs (Rosuvastatin Calcium) .... Take One Tablet By Mouth Daily. 6)  Carvedilol 12.5 Mg Tabs (Carvedilol) .... Take One Tablet By Mouth Twice A Day 7)  Alprazolam 0.5 Mg Tabs (Alprazolam) .... As Needed 8)  Furosemide 40 Mg Tabs (Furosemide) .... Take One Tablet By Mouth Daily. 9)  Potassium Chloride Cr 10 Meq Cr-Caps (Potassium Chloride) .... Take One Tablet By Mouth Daily 10)  Oxycodone-Acetaminophen 5-325 Mg Tabs (Oxycodone-Acetaminophen) .... As Needed 11)  Diovan 40 Mg Tabs (Valsartan) .... Take One Tablet By Mouth Daily 12)  Glipizide 10 Mg Tabs (Glipizide) .... Take 1 Tablet By Mouth Once A Day 13)  Janumet 50-1000 Mg Tabs (Sitagliptin-Metformin Hcl) .... Take One Tab By Mouth Once Daily  Allergies: 1)  ! Pcn  Past History:  Past Medical History: Reviewed history from 12/06/2009 and no changes required. 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  s/p MI 07/24/08 with PCI Endeavor DES to Prox. LAD and BMS to prox. LCX.      b.  relook cath 07/28/08 showing cont. patency of LAD and LCX. Systolic HF, chronic DDD  Past Surgical History: Reviewed history from 08/16/2009 and no changes required. Cholecystectomy 27 years ago.   Cardiac catheterization March 2010.  Two cardiac stent placement in   March 2010.   Defibrillator placement in December of 2010.   abdominal hysterectomy  bilateral salpingo-  oophorectomy  TAH-BSO and left pelvic lymphadenectomy by Dr. De Blanch   left pelvic lymphadenectomy  Vital Signs:  Patient profile:   50 year old female Height:      64 inches Weight:      179 pounds BMI:     30.84 Pulse (ortho):   93 / minute BP sitting:   94 / 70  (left arm)  Vitals Entered By: Laurance Flatten CMA (March 02, 2010 10:53 AM)  Physical Exam  General:  Well developed, well nourished, in no acute distress. Head:  normocephalic and atraumatic Eyes:  PERRLA/EOM intact; conjunctiva and lids normal. Mouth:  Teeth, gums and palate normal. Oral mucosa normal. Neck:  Neck supple, no JVD. No masses, thyromegaly or abnormal cervical nodes. Chest Wall:  ICD pocket is well healed Lungs:  Clear bilaterally to auscultation and percussion. Heart:  Non-displaced PMI, chest non-tender; regular rate and rhythm, S1, S2 without murmurs, rubs or gallops. Carotid upstroke normal, no bruit. Normal abdominal aortic size, no bruits. Femorals normal pulses, no bruits. Pedals normal pulses. No edema, no varicosities. Abdomen:  Bowel sounds positive; abdomen soft and non-tender without masses, organomegaly, or hernias noted. No hepatosplenomegaly. Msk:  Back normal, normal gait. Muscle strength and tone normal. Pulses:  pulses normal in all 4 extremities Extremities:  No clubbing or cyanosis. Neurologic:  Alert and oriented x 3.    ICD Specifications Following MD:  Hillis Range, MD     Referring MD:  COOPER ICD Vendor:  St Jude     ICD Model Number:  [id number]     ICD Serial Number:  [id number] ICD DOI:  05/11/2009     ICD Implanting MD:  Hillis Range, MD  Lead 1:    Location: RV     DOI: 05/11/2009     Model #: [id number]     Serial #: [id number]     Status: active  Indications::  ICM   ICD Follow Up Battery Voltage:  92% V     Charge Time:  8.6 seconds     Battery Est. Longevity:  8.3 yrs Underlying rhythm:  SR ICD Dependent:  No       ICD Device Measurements Right Ventricle:  Amplitude: 12.0 mV, Impedance: 580 ohms, Threshold: 1.0 V at 0.4 msec Shock Impedance: 68 ohms   Episodes MS Episodes:  0     Shock:  0     ATP:  0     Nonsustained:  0     Atrial Therapies:  0 Ventricular Pacing:  <1%  Brady Parameters Mode VVI     Lower Rate Limit:  40      Tachy Zones VF:  200     VT:  171 (MONITOR)     Next Cardiology Appt Due:  05/16/2010 Tech Comments:  NORMAL DEVICE FUNCTION.  NO EPISODES SINCE LAST CHECK.  CHANGED RV OUTPUT FROM 3.5 TO 2.5 V.  PT PREFERS OV RATHER THAN MERLIN TRANSMISSIONS. Vella Kohler MD Comments:  agree  Impression & Recommendations:  Problem # 1:  ISCHEMIC CARDIOMYOPATHY (ICD-414.8) doing well without syptoms of ischemia or CHF normal ICD function changes as above  Patient Instructions: 1)  return to device clinic in 3 months

## 2010-06-16 NOTE — Progress Notes (Signed)
Summary: Reflux Med  Phone Note Call from Patient Call back at Home Phone [phone]   Caller: Patient Summary of Call: Pt have  question about the medicastion Famotidine 40mg  Initial call taken by: Judie Grieve,  June 29, 2009 1:20 PM  Follow-up for Phone Call        Pt was on Prevacid for GERD when she was initially discharged from the hospital.  After the pt's event she was switched to Famotidine 40mg  two times a day due to taking Plavix.  The pt states this medication is not treating her reflux and would like to know if she can take something else for GERD. Julieta Gutting, RN, BSN  June 29, 2009 4:56 PM  Additional Follow-up for Phone Call Additional follow up Details #1::        Protonix 40 mg daily Additional Follow-up by: Norva Karvonen, MD,  June 29, 2009 5:55 PM    Additional Follow-up for Phone Call Additional follow up Details #2::    I spoke with the pt and made her aware that she could try Protonix (pantoprazole) instead of Famotidine for her reflux symptoms.  Rx sent to pharmacy.  The pt said she also has been coughing up some phlegm in the morning.  The pt denies fever, chills or loss of appetite.  The pt has not been coughing during the day.  I recommended an antihistimine or Mucinex if symptoms continued.  I made the pt aware that she cannot take a decongestant. Follow-up by: Julieta Gutting, RN, BSN,  June 30, 2009 1:04 PM  New/Updated Medications: PANTOPRAZOLE SODIUM 40 MG TBEC (PANTOPRAZOLE SODIUM) take one tablet by mouth 30 minutes prior to your morning meal Prescriptions: PANTOPRAZOLE SODIUM 40 MG TBEC (PANTOPRAZOLE SODIUM) take one tablet by mouth 30 minutes prior to your morning meal  #30 x 6   Entered by:   Julieta Gutting, RN, BSN   Authorized by:   Norva Karvonen, MD   Signed by:   Julieta Gutting, RN, BSN on 06/30/2009   Method used:   [facility] to        [facility]  [address] [id number]       [address]  [place], [place]  [address]       Ph: [phone] or [phone]       Fax: [phone]   RxID:   [id number]

## 2010-06-16 NOTE — Assessment & Plan Note (Signed)
Summary: pc2/kfw   Visit Type:  Follow-up Referring Provider:  Tonny Bollman, MD Primary Provider:  Dr. Fredric Dine   History of Present Illness: The patient presents today for routine electrophysiology followup. She reports doing very well since having her ICD implanted. The patient denies symptoms of palpitations, chest pain, shortness of breath, orthopnea, PND, lower extremity edema, dizziness, presyncope, syncope, or neurologic sequela. The patient is tolerating medications without difficulties and is otherwise without complaint today.   Current Medications (verified): 1)  Plavix 75 Mg Tabs (Clopidogrel Bisulfate) .... One Daily 2)  Fish Oil Concentrate 1000 Mg Caps (Omega-3 Fatty Acids) .... One Daily 3)  Aspirin 325 Mg Tabs (Aspirin) .... Take 1 Tab By Mouth Every Day 4)  Nitrostat 0.4 Mg Subl (Nitroglycerin) .... As Needed 5)  Digoxin 0.25 Mg Tabs (Digoxin) .... Take One Daily 6)  Crestor 40 Mg Tabs (Rosuvastatin Calcium) .... One Daily 7)  Carvedilol 12.5 Mg Tabs (Carvedilol) .... Take One Tablet By Mouth Twice A Day 8)  Alprazolam 0.5 Mg Tabs (Alprazolam) .... As Needed 9)  Furosemide 40 Mg Tabs (Furosemide) .... Take One Tablet By Mouth Daily. 10)  Potassium Chloride Cr 10 Meq Cr-Caps (Potassium Chloride) .... Take One Tablet By Mouth Daily 11)  Oxycodone-Acetaminophen 5-325 Mg Tabs (Oxycodone-Acetaminophen) .... As Needed 12)  Diovan 40 Mg Tabs (Valsartan) .... Take One Tablet By Mouth Daily 13)  Glipizide 10 Mg Tabs (Glipizide) .... Take 1 Tablet By Mouth Once A Day 14)  Janumet 50-500 Mg Tabs (Sitagliptin-Metformin Hcl) .... Take 1 Tablet By Mouth Two Times A Day  Allergies (verified): 1)  ! Pcn  Past History:  Past Medical History: Reviewed history from 03/24/2009 and no changes required. 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  s/p MI 07/24/08 with PCI Endeavor DES to Prox. LAD and BMS to prox. LCX.      b.  relook cath 07/28/08 showing cont. patency of LAD and LCX. LOWER EXTREMITY EDEMA DDD  Past Surgical History: Reviewed history from 08/16/2009 and no changes required. Cholecystectomy 27 years ago.   Cardiac catheterization March 2010.  Two cardiac stent placement in   March 2010.   Defibrillator placement in December of 2010.   abdominal hysterectomy  bilateral salpingo-  oophorectomy  TAH-BSO and left pelvic lymphadenectomy by Dr. 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   Smokes a half-pack to a pack a day, but she has not smoked since her  discharge.  She lives in [place] with a roommate.  Occasional  alcohol.   Vital Signs:  Patient profile:   50 year old female Height:      64 inches Weight:      170 pounds BMI:     29.29 Pulse rate:   88 / minute BP sitting:   98 / 66  (left arm)  Vitals Entered By: Laurance Flatten CMA (August 20, 2009 11:21 AM)  Physical Exam  General:  Well developed, well nourished, in no acute distress. Head:  normocephalic and atraumatic Mouth:  Teeth, gums and palate normal. Oral mucosa normal. Neck:  Neck supple, no JVD. No masses, thyromegaly or abnormal cervical nodes. Chest Wall:  ICD pocket is well healed Lungs:  Clear bilaterally to auscultation and percussion. Heart:  Non-displaced PMI, chest non-tender; regular rate and rhythm, S1, S2 without murmurs, rubs or gallops. Carotid upstroke normal, no bruit. Normal abdominal aortic size, no bruits. Femorals normal pulses, no bruits. Pedals normal pulses. No edema, no varicosities. Abdomen:  Bowel sounds positive; abdomen soft and non-tender without masses, organomegaly, or hernias noted. No hepatosplenomegaly. Msk:  Back normal, normal gait. Muscle strength and tone  normal. Pulses:  pulses normal in all 4 extremities Neurologic:  Alert and oriented x 3.   EKG  Procedure date:  08/20/2009  Findings:      sinus rhythm 88 bpm, PR 168, QRS 78, QT 390, anteroseptal infaction   ICD Specifications Following MD:  Hillis Range, MD     Referring MD:  Excell Seltzer ICD Vendor:  St Jude     ICD Model Number:  [phone]     ICD Serial Number:  [id number] ICD DOI:  05/11/2009     ICD Implanting MD:  Hillis Range, MD  Lead 1:    Location: RV     DOI: 05/11/2009     Model #: [id number]     Serial #: [id number]     Status: active  Indications::  ICM   ICD Follow Up Remote Check?  No Charge Time:  8.3 seconds     Battery Est. Longevity:  8.6 years ICD Dependent:  No       ICD Device Measurements Right Ventricle:  Amplitude: 11.8 mV, Impedance: 550 ohms, Threshold: 0.75 V at 0.4 msec  Episodes Shock:  0     ATP:  0     Nonsustained:  0     Ventricular Pacing:  <1%  Brady Parameters Mode VVI     Lower Rate Limit:  40      Tachy Zones VF:  200     VT:  171 (MONITOR)     Next Remote Date:  11/18/2009     Next Cardiology Appt Due:  05/15/2010 Tech Comments:  No parameter changes.  Device function normal.  Merlin transmissions every 3 months.  ROV 1/12 with Dr. Johney Frame. Altha Harm, LPN  August 21, 1306 12:44 PM   Impression & Recommendations:  Problem # 1:  ISCHEMIC CARDIOMYOPATHY (ICD-414.8) doing well no changes today  Patient Instructions: 1)  remote device checks every 3 months 2)  return 05/2010 Prescriptions: NITROSTAT 0.4 MG SUBL (NITROGLYCERIN) as needed  #25 x 2   Entered by:   Julieta Gutting, RN, BSN   Authorized by:   Hillis Range, MD   Signed by:   Julieta Gutting, RN, BSN on 08/20/2009   Method used:   [facility]        [facility]  [address] [phone]       [address]       [place], [place]  [address]       Ph: [phone] or [phone]       Fax: [phone]   RxID:   [id number]

## 2010-06-16 NOTE — Procedures (Signed)
Summary: device/saf   Current Medications (verified): 1)  Plavix 75 Mg Tabs (Clopidogrel Bisulfate) .... One Daily 2)  Aspirin 81 Mg Tbec (Aspirin) .... Take One Tablet By Mouth Daily 3)  Nitrostat 0.4 Mg Subl (Nitroglycerin) .... As Needed 4)  Digoxin 0.25 Mg Tabs (Digoxin) .... Take One Daily 5)  Crestor 20 Mg Tabs (Rosuvastatin Calcium) .... Take One Tablet By Mouth Daily. 6)  Carvedilol 25 Mg Tabs (Carvedilol) .... Take One Tablet By Mouth Twice A Day 7)  Alprazolam 0.5 Mg Tabs (Alprazolam) .... As Needed 8)  Furosemide 40 Mg Tabs (Furosemide) .... Take One Tablet By Mouth Daily. 9)  Potassium Chloride Cr 10 Meq Cr-Caps (Potassium Chloride) .... Take One Tablet By Mouth Daily 10)  Oxycodone-Acetaminophen 5-325 Mg Tabs (Oxycodone-Acetaminophen) .... As Needed 11)  Diovan 40 Mg Tabs (Valsartan) .... Take One Tablet By Mouth Daily 12)  Glipizide 10 Mg Tabs (Glipizide) .... Take 1 Tablet By Mouth Once A Day 13)  Janumet 50-1000 Mg Tabs (Sitagliptin-Metformin Hcl) .... Take One Tab By Mouth Once Daily  Allergies (verified): 1)  ! Pcn   ICD Specifications Following MD:  Hillis Range, MD     Referring MD:  Excell Seltzer ICD Vendor:  St Jude     ICD Model Number:  [phone]     ICD Serial Number:  [id number] ICD DOI:  05/11/2009     ICD Implanting MD:  Hillis Range, MD  Lead 1:    Location: RV     DOI: 05/11/2009     Model #: [id number]     Serial #: [id number]     Status: active  Indications::  ICM   ICD Follow Up Remote Check?  No Charge Time:  8.8 seconds     Battery Est. Longevity:  8.2 years Underlying rhythm:  SR ICD Dependent:  No       ICD Device Measurements Right Ventricle:  Amplitude: 11.9 mV, Impedance: 490 ohms, Threshold: 1.0 V at 0.4 msec Shock Impedance: 77 ohms   Episodes Coumadin:  No Shock:  0     ATP:  0     Nonsustained:  0     Ventricular Pacing:  <1%  Brady Parameters Mode VVI     Lower Rate Limit:  40      Tachy Zones VF:  200     VT:  171 (MONITOR)     Next  Cardiology Appt Due:  08/14/2010 Tech Comments:  Auto capture on today.  Device function normal.  No Merlin @ this time.  ROV 3 months clinic. Altha Harm, LPN  June 02, 2010 10:06 AM

## 2010-06-16 NOTE — Progress Notes (Signed)
Summary: pt having sugery on feb 7th  Phone Note Call from Patient Call back at Home Phone [phone]   Caller: Patient Reason for Call: Talk to Nurse, Talk to Doctor Summary of Call: pt is schedule for a d & c on feb 7th and she wants to make sure it is ok for her to do that and has dr. Excell Seltzer talk to her obgyn about it Initial call taken by: Omer Jack,  June 15, 2009 10:48 AM  Follow-up for Phone Call        discussed with dr Excell Seltzer, he talked to pt OBGYN yesterday. pt is low risk for surgery. pt aware Deliah Goody, RN  June 15, 2009 6:38 PM

## 2010-06-16 NOTE — Procedures (Signed)
Summary: Cardiology Device Clinic   Allergies: 1)  ! Pcn   ICD Specifications Following MD:  Hillis Range, MD     Referring MD:  Excell Seltzer ICD Vendor:  St Jude     ICD Model Number:  [phone]     ICD Serial Number:  [id number] ICD DOI:  05/11/2009     ICD Implanting MD:  Hillis Range, MD  Lead 1:    Location: RV     DOI: 05/11/2009     Model #: [id number]     Serial #: [id number]     Status: active  Indications::  ICM   ICD Follow Up Remote Check?  No Battery Voltage:  >95% V     Charge Time:  8.3 seconds     Battery Est. Longevity:  8.9 YEARS Underlying rhythm:  ST ICD Dependent:  No       ICD Device Measurements Right Ventricle:  Amplitude: 11.8 mV, Impedance: 490 ohms, Threshold: 1.0 V at 0.5 msec Shock Impedance: 61 ohms   Episodes Shock:  0     ATP:  0     Nonsustained:  0     Ventricular Pacing:  0%  Brady Parameters Mode VVI     Lower Rate Limit:  40      Tachy Zones VF:  200     VT:  171 (MONITOR)     Next Cardiology Appt Due:  08/20/2009 Tech Comments:  Normal device function.  Wound check appt today.  Steri-strips removed.  Incision at proximal edge not yet approximated fully, steri-strips reapplied to that area.  Pt advised to remove in 1 week if they have not fallen off on their own.  Pt in ST today with all heart rates between 90-100.  Dr Excell Seltzer was seeing patient also, he increased her Coreg to help with heart rate control.  Plan ROV 4-11 with Dr Johney Frame as scheduled. Gypsy Balsam RN BSN  May 18, 2009 11:21 AM

## 2010-06-16 NOTE — Progress Notes (Signed)
Summary: clearance for hysterectomy  Phone Note Call from Patient Call back at Home Phone [phone]   Caller: Patient Reason for Call: Talk to Nurse Details for Reason: Per pt calling, pt need surgery for hysterectomy pending 3/22-  need clearance for surgery.  Initial call taken by: Lorne Skeens,  July 08, 2009 10:39 AM  Follow-up for Phone Call        I spoke with the pt and she is already scheduled for an exercise myoview on 07/13/09.  The pt's surgery could be on 3/22 or 3/29 and she does not know who will be performing her surgery at this time.  I told the pt that Dr Excell Seltzer would have to review her myoview and make further decisions about the plavix and ASA prior to surgery.  We will await her Myoview results and documentation from her surgeron once the procedure is scheduled. Follow-up by: Julieta Gutting, RN, BSN,  July 08, 2009 3:45 PM

## 2010-06-16 NOTE — Medication Information (Signed)
Summary: Pantoprazole Sodium  Pantoprazole Sodium   Imported By: Marylou Mccoy 10/06/2009 16:40:09  _____________________________________________________________________  External Attachment:    Type:   Image     Comment:   External Document

## 2010-06-16 NOTE — Progress Notes (Signed)
  Phone Note From Other Clinic   Caller: Marylu Lund Details for Reason: pt.Information Initial call taken by: Marijean Niemann LOV,[place] over to [facility] Pre-Opp [phone] [facility]  June 17, 2009 3:14 PM

## 2010-06-16 NOTE — Progress Notes (Signed)
Summary: Nuclear Pre-Procedure  Phone Note Outgoing Call   Call placed by: Milana Na, EMT-P,  July 14, 2009 3:32 PM Summary of Call: Reviewed information on Myoview Information Sheet (see scanned document for further details).  Spoke with patient.     Nuclear Med Background Indications for Stress Test: Evaluation for Ischemia, Surgical Clearance  Indications Comments: Pending Hysterectomy 08/03/09  History: Defibrillator, Echo, GXT, Heart Catheterization, Myocardial Infarction, Stents  History Comments: 07/28/08 Heart Cath 03/10 MI AWMI secondary to LAD Dissection 03/10 Stents LAD CFX 07/25/08 ECHO EF40-50% 06/10 GXT (-) 12/10 Defibrilator ICM      Nuclear Pre-Procedure Cardiac Risk Factors: Hypertension, Lipids Height (in): 64  Nuclear Med Study Referring MD:  M.Cooper

## 2010-06-16 NOTE — Procedures (Signed)
Summary: Cardiology Device Clinic   Allergies: 1)  ! Pcn   ICD Specifications Following MD:  Hillis Range, MD     Referring MD:  Excell Seltzer ICD Vendor:  St Jude     ICD Model Number:  [id number]     ICD Serial Number:  [id number] ICD DOI:  05/11/2009     ICD Implanting MD:  Hillis Range, MD  Lead 1:    Location: RV     DOI: 05/11/2009     Model #: [id number]     Serial #: [id number]     Status: active  Indications::  ICM   ICD Follow Up Remote Check?  No Charge Time:  8.5 seconds     Battery Est. Longevity:  8.3 years Underlying rhythm:  SR@86  ICD Dependent:  No       ICD Device Measurements Right Ventricle:  Amplitude: 11.8 mV, Impedance: 530 ohms, Threshold: 0.75 V at 0.4 msec Shock Impedance: 72 ohms   Episodes Shock:  0     ATP:  0     Nonsustained:  0     Ventricular Pacing:  <1%  Brady Parameters Mode VVI     Lower Rate Limit:  40      Tachy Zones VF:  200     VT:  171 (MONITOR)     Next Cardiology Appt Due:  01/13/2010 Tech Comments:  Lead impedance alerts reprogrammed.  Device function normal.  ROV 9/11 with Dr. Johney Frame. Altha Harm, LPN  December 06, 2009 9:37 AM

## 2010-06-16 NOTE — Medication Information (Signed)
Summary: Pantoprazole 40 mg  Pantoprazole 40 mg   Imported By: Marylou Mccoy 06/30/2009 14:47:27  _____________________________________________________________________  External Attachment:    Type:   Image     Comment:   External Document

## 2010-06-16 NOTE — Cardiovascular Report (Signed)
Summary: Office Visit   Office Visit   Imported By: Roderic Ovens 03/04/2010 16:54:30  _____________________________________________________________________  External Attachment:    Type:   Image     Comment:   External Document

## 2010-06-16 NOTE — Assessment & Plan Note (Signed)
Summary: ROV   Visit Type:  Follow-up Referring Provider:  Tonny Bollman, MD Primary Provider:  Dr. Fredric Dine  CC:  no cardiac complains.  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 She also required stenting of the LCx at that time. Post Mi LVEF is in range of 30-35% and she underwent ICD implant January 2011. She presents today for followup evaluation.  Since her last visit, she has undergone hysterectomy for treatment of endometrial cancer. She was off of her dual antiplatelet therapy and has not resumed aspirin or Plavix to date.  The patient denies chest pain, dyspnea, edema, orthopnea, or PND.  She has developed type 2 diabetes and has had some difficulty with glycemic control. Blood sugars are now better on a combination of glipizide and janumet.  Current Medications (verified): 1)  Plavix 75 Mg Tabs (Clopidogrel Bisulfate) .... One Daily- On Hold 2)  Fish Oil Concentrate 1000 Mg Caps (Omega-3 Fatty Acids) .... One Daily 3)  Aspirin 325 Mg Tabs (Aspirin) .... Take 1 Tab By Mouth Every Day On Hold 4)  Nitrostat 0.4 Mg Subl (Nitroglycerin) .... As Needed 5)  Digoxin 0.25 Mg Tabs (Digoxin) .... Take One Daily 6)  Crestor 40 Mg Tabs (Rosuvastatin Calcium) .... One Daily 7)  Carvedilol 12.5 Mg Tabs (Carvedilol) .... Take One Tablet By Mouth Twice A Day 8)  Alprazolam 0.5 Mg Tabs (Alprazolam) .... As Needed 9)  Furosemide 40 Mg Tabs (Furosemide) .... Take One Tablet By Mouth Daily. 10)  Potassium Chloride Cr 10 Meq Cr-Caps (Potassium Chloride) .... Take One Tablet By Mouth Daily 11)  Oxycodone-Acetaminophen 5-325 Mg Tabs (Oxycodone-Acetaminophen) .... As Needed 12)  Diovan 40 Mg Tabs (Valsartan) .... Take One Tablet By Mouth Daily 13)  Glipizide 10 Mg Tabs (Glipizide) .... Take 1 Tablet By Mouth Once A Day 14)  Janumet 50-500 Mg Tabs (Sitagliptin-Metformin Hcl) .... Take 1 Tablet By Mouth Two Times A  Day  Allergies: 1)  ! Pcn  Past History:  Past medical history reviewed for relevance to current acute and chronic problems.  Past Medical History: Reviewed history from 03/24/2009 and no changes required. 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  Echo 07/26/2008 EF 40-50% with perapical HK HYPOKALEMIA (ICD-276.8) HYPERTENSION (ICD-401.9) GERD (ICD-530.81) ILEUS (ICD-560.1) CAD (ICD-414.00)      a.  s/p MI 07/24/08 with PCI Endeavor DES to Prox. LAD and BMS to prox. LCX.      b.  relook cath 07/28/08 showing cont. patency of LAD and LCX. LOWER EXTREMITY EDEMA DDD  Review of Systems       Negative except as per HPI   Vital Signs:  Patient profile:   50 year old female Height:      64 inches Weight:      172.75 pounds BMI:     29.76 Pulse rate:   94 / minute Pulse rhythm:   regular Resp:     18 per minute BP sitting:   90 / 60  (right arm) Cuff size:   large  Vitals Entered By: Vikki Ports (August 17, 2009 2:58 PM)  Physical Exam  General:  Pt is alert and oriented, in no acute distress. HEENT: normal Neck: normal carotid upstrokes without bruits, JVP normal Lungs: CTA CV: RRR without murmur or gallop Abd: soft, NT, positive BS, no bruit, no organomegaly Ext: no clubbing, cyanosis, or edema. peripheral pulses 2+ and equal Skin: warm and  dry without rash     ICD Specifications Following MD:  Hillis Range, MD     Referring MD:  Excell Seltzer ICD Vendor:  St Jude     ICD Model Number:  [phone]     ICD Serial Number:  [id number] ICD DOI:  05/11/2009     ICD Implanting MD:  Hillis Range, MD  Lead 1:    Location: RV     DOI: 05/11/2009     Model #: [id number]     Serial #: [id number]     Status: active  Indications::  ICM   ICD Follow Up ICD Dependent:  No      Brady Parameters Mode VVI     Lower Rate Limit:  40      Tachy Zones VF:  200     VT:  171 (MONITOR)     Impression & Recommendations:  Problem # 1:  CAD, NATIVE VESSEL (ICD-414.01) The  patient is stable. She has no angina at present. She did well in the perioperative period. Recommend resume aspirin today.  She would like to wait another 2 days to resume Plavix because she will then be off of Lovenox. Otherwise recommend continue current medical program.  Note her stress Myoview was negative for ischemia as part of her preoperative evaluation. Her updated medication list for this problem includes:    Plavix 75 Mg Tabs (Clopidogrel bisulfate) ..... One daily- on hold    Aspirin 325 Mg Tabs (Aspirin) .Marland Kitchen... Take 1 tab by mouth every day on hold    Nitrostat 0.4 Mg Subl (Nitroglycerin) .Marland Kitchen... As needed    Carvedilol 12.5 Mg Tabs (Carvedilol) .Marland Kitchen... 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 Low blood pressure precludes upward dose titration of Coreg or Diovan.  Patient is status post ICD implant by Dr. Johney Frame.  Her updated medication list for this problem includes:    Plavix 75 Mg Tabs (Clopidogrel bisulfate) ..... One daily- on hold    Aspirin 325 Mg Tabs (Aspirin) .Marland Kitchen... Take 1 tab by mouth every day on hold    Nitrostat 0.4 Mg Subl (Nitroglycerin) .Marland Kitchen... As needed    Digoxin 0.25 Mg Tabs (Digoxin) .Marland Kitchen... Take one daily    Carvedilol 12.5 Mg Tabs (Carvedilol) .Marland Kitchen... Take one tablet by mouth twice a day    Furosemide 40 Mg Tabs (Furosemide) .Marland Kitchen... Take one tablet by mouth daily.    Diovan 40 Mg Tabs (Valsartan) .Marland Kitchen... Take one tablet by mouth daily  Problem # 3:  HYPERLIPIDEMIA-MIXED (ICD-272.4) Followed by PCP. LDL goal less than 70. Her updated medication list for this problem includes:    Crestor 40 Mg Tabs (Rosuvastatin calcium) ..... One daily  Patient Instructions: 1)  Your physician has recommended you make the following change in your medication: Resume Aspirin today, Resume Plavix in 2 days 2)  Your physician wants you to follow-up in:   3 MONTHS. You will receive a reminder letter in the mail  two months in advance. If you don't receive a letter, please call our office to schedule the follow-up appointment. 3)  Your physician recommends that you have a FASTING LIPID, LIVER and  BMP (428.22, 414.01)--Nothing to eat or drink after midnight

## 2010-06-16 NOTE — Progress Notes (Signed)
Summary: flu  Phone Note Call from Patient Call back at [phone]   Caller: Daughter Reason for Call: Talk to Nurse Summary of Call: bx done by gyn... had the flu wed and thurs, has only urinated 7 times since wednesday Initial call taken by: Migdalia Dk,  June 11, 2009 11:14 AM  Follow-up for Phone Call        The pt is currently at work and asked her daughter to call and let us know that she had a biopsy performed when she went to the GYN.  The pt also had a question about the number of times she had urinated.  The pt had a stomach virus this week and has only urinated 7 times since Wednesday.  I told the pt's daughter that she could be a little dehydrated and to make sure the pt is drinking fluids.    Follow-up by: Julieta Gutting, RN, BSN,  June 11, 2009 11:50 AM

## 2010-06-22 NOTE — Cardiovascular Report (Signed)
Summary: Office Visit   Office Visit   Imported By: Roderic Ovens 06/13/2010 16:09:41  _____________________________________________________________________  External Attachment:    Type:   Image     Comment:   External Document

## 2010-06-27 ENCOUNTER — Other Ambulatory Visit: Payer: Self-pay

## 2010-07-01 ENCOUNTER — Other Ambulatory Visit: Payer: Self-pay

## 2010-07-08 ENCOUNTER — Other Ambulatory Visit: Payer: Self-pay | Admitting: Internal Medicine

## 2010-07-08 ENCOUNTER — Other Ambulatory Visit (INDEPENDENT_AMBULATORY_CARE_PROVIDER_SITE_OTHER): Payer: Managed Care, Other (non HMO)

## 2010-07-08 ENCOUNTER — Encounter: Payer: Self-pay | Admitting: Internal Medicine

## 2010-07-08 DIAGNOSIS — I2510 Atherosclerotic heart disease of native coronary artery without angina pectoris: Secondary | ICD-10-CM

## 2010-07-08 DIAGNOSIS — E785 Hyperlipidemia, unspecified: Secondary | ICD-10-CM

## 2010-07-08 LAB — HEPATIC FUNCTION PANEL: Total Protein: 7.6 g/dL (ref 6.0–8.3)

## 2010-07-29 LAB — POCT CARDIAC MARKERS
CKMB, poc: 4.9 ng/mL (ref 1.0–8.0)
Myoglobin, poc: 112 ng/mL (ref 12–200)
Troponin i, poc: <0.05 ng/mL (ref 0.00–0.09)

## 2010-07-29 LAB — BASIC METABOLIC PANEL
BUN: 9 mg/dL (ref 6–23)
CO2: 26 mEq/L (ref 19–32)
Creatinine, Ser: 0.92 mg/dL (ref 0.4–1.2)
GFR calc Af Amer: >60 mL/min (ref >60)
GFR calc non Af Amer: >60 mL/min (ref >60)
Glucose, Bld: 316 mg/dL — ABNORMAL HIGH (ref 70–99)
Sodium: 139 mEq/L (ref 135–145)

## 2010-07-29 LAB — DIFFERENTIAL
Basophils Absolute: 0.1 K/uL (ref 0.0–0.1)
Basophils Relative: 1 % (ref 0–1)
Eosinophils Absolute: 0.2 K/uL (ref 0.0–0.7)
Eosinophils Relative: 2 % (ref 0–5)
Lymphocytes Relative: 19 % (ref 12–46)
Lymphs Abs: 1.6 K/uL (ref 0.7–4.0)
Monocytes Absolute: 0.6 K/uL (ref 0.1–1.0)
Monocytes Relative: 7 % (ref 3–12)
Neutro Abs: 6 K/uL (ref 1.7–7.7)
Neutrophils Relative %: 71 % (ref 43–77)

## 2010-07-29 LAB — CBC
Hemoglobin: 11.4 g/dL — ABNORMAL LOW (ref 12.0–15.0)
Platelets: 327 10*3/uL (ref 150–400)
RBC: 4.05 MIL/uL (ref 3.87–5.11)

## 2010-08-03 LAB — GLUCOSE, CAPILLARY: Glucose-Capillary: 92 mg/dL (ref 70–99)

## 2010-08-04 LAB — COMPREHENSIVE METABOLIC PANEL
BUN: 7 mg/dL (ref 6–23)
CO2: 28 mEq/L (ref 19–32)
Calcium: 9.4 mg/dL (ref 8.4–10.5)
Chloride: 100 mEq/L (ref 96–112)
Creatinine, Ser: 0.95 mg/dL (ref 0.4–1.2)
GFR calc non Af Amer: >60 mL/min (ref >60)
Glucose, Bld: 236 mg/dL — ABNORMAL HIGH (ref 70–99)
Total Bilirubin: 0.4 mg/dL (ref 0.3–1.2)

## 2010-08-04 LAB — DIFFERENTIAL
Basophils Absolute: 0.2 10*3/uL — ABNORMAL HIGH (ref 0.0–0.1)
Lymphocytes Relative: 21 % (ref 12–46)
Lymphs Abs: 2.4 10*3/uL (ref 0.7–4.0)
Monocytes Absolute: 0.6 10*3/uL (ref 0.1–1.0)
Monocytes Relative: 6 % (ref 3–12)
Neutro Abs: 7.9 10*3/uL — ABNORMAL HIGH (ref 1.7–7.7)

## 2010-08-04 LAB — CBC
Hemoglobin: 12 g/dL (ref 12.0–15.0)
RBC: 3.93 MIL/uL (ref 3.87–5.11)
RDW: 13.8 % (ref 11.5–15.5)
WBC: 11.3 10*3/uL — ABNORMAL HIGH (ref 4.0–10.5)

## 2010-08-04 LAB — GLUCOSE, CAPILLARY

## 2010-08-08 LAB — CBC
HCT: 35.2 % — ABNORMAL LOW (ref 36.0–46.0)
HCT: 26.5 % — ABNORMAL LOW (ref 36.0–46.0)
HCT: 26.6 % — ABNORMAL LOW (ref 36.0–46.0)
Hemoglobin: 12 g/dL (ref 12.0–15.0)
Hemoglobin: 11.5 g/dL — ABNORMAL LOW (ref 12.0–15.0)
MCHC: 33.1 g/dL (ref 30.0–36.0)
MCHC: 33.9 g/dL (ref 30.0–36.0)
MCV: 88.3 fL (ref 78.0–100.0)
Platelets: 296 10*3/uL (ref 150–400)
Platelets: 310 10*3/uL (ref 150–400)
RBC: 3.91 MIL/uL (ref 3.87–5.11)
RDW: 13.8 % (ref 11.5–15.5)
WBC: 10.9 10*3/uL — ABNORMAL HIGH (ref 4.0–10.5)
WBC: 8.6 10*3/uL (ref 4.0–10.5)
WBC: 9.1 10*3/uL (ref 4.0–10.5)

## 2010-08-08 LAB — KETONES, QUALITATIVE: Acetone, Bld: NEGATIVE

## 2010-08-08 LAB — DIFFERENTIAL
Basophils Absolute: 0.1 10*3/uL (ref 0.0–0.1)
Basophils Relative: 1 % (ref 0–1)
Eosinophils Absolute: 0 10*3/uL (ref 0.0–0.7)
Lymphs Abs: 1.5 10*3/uL (ref 0.7–4.0)
Neutrophils Relative %: 78 % — ABNORMAL HIGH (ref 43–77)

## 2010-08-08 LAB — GLUCOSE, CAPILLARY
Glucose-Capillary: 372 mg/dL — ABNORMAL HIGH (ref 70–99)
Glucose-Capillary: 548 mg/dL — ABNORMAL HIGH (ref 70–99)
Glucose-Capillary: 149 mg/dL — ABNORMAL HIGH (ref 70–99)
Glucose-Capillary: 155 mg/dL — ABNORMAL HIGH (ref 70–99)
Glucose-Capillary: 166 mg/dL — ABNORMAL HIGH (ref 70–99)
Glucose-Capillary: 171 mg/dL — ABNORMAL HIGH (ref 70–99)
Glucose-Capillary: 180 mg/dL — ABNORMAL HIGH (ref 70–99)

## 2010-08-08 LAB — POCT I-STAT, CHEM 8
Chloride: 103 mEq/L (ref 96–112)
Creatinine, Ser: 0.7 mg/dL (ref 0.4–1.2)
Glucose, Bld: 287 mg/dL — ABNORMAL HIGH (ref 70–99)
HCT: 31 % — ABNORMAL LOW (ref 36.0–46.0)
Hemoglobin: 12.2 g/dL (ref 12.0–15.0)
Potassium: 3.3 mEq/L — ABNORMAL LOW (ref 3.5–5.1)
Sodium: 130 mEq/L — ABNORMAL LOW (ref 135–145)
TCO2: 25 mmol/L (ref 0–100)

## 2010-08-08 LAB — COMPREHENSIVE METABOLIC PANEL
ALT: 33 U/L (ref 0–35)
Alkaline Phosphatase: 150 U/L — ABNORMAL HIGH (ref 39–117)
Alkaline Phosphatase: 100 U/L (ref 39–117)
BUN: 11 mg/dL (ref 6–23)
CO2: 23 mEq/L (ref 19–32)
Calcium: 9 mg/dL (ref 8.4–10.5)
Chloride: 96 mEq/L (ref 96–112)
Chloride: 100 mEq/L (ref 96–112)
GFR calc non Af Amer: >60 mL/min (ref >60)
Glucose, Bld: 590 mg/dL (ref 70–99)
Glucose, Bld: 352 mg/dL — ABNORMAL HIGH (ref 70–99)
Potassium: 4.1 mEq/L (ref 3.5–5.1)
Sodium: 131 mEq/L — ABNORMAL LOW (ref 135–145)
Total Bilirubin: 0.7 mg/dL (ref 0.3–1.2)
Total Bilirubin: 0.6 mg/dL (ref 0.3–1.2)

## 2010-08-08 LAB — BASIC METABOLIC PANEL
BUN: 3 mg/dL — ABNORMAL LOW (ref 6–23)
GFR calc non Af Amer: >60 mL/min (ref >60)
Potassium: 3.7 mEq/L (ref 3.5–5.1)
Sodium: 135 mEq/L (ref 135–145)

## 2010-08-08 LAB — HEMOGLOBIN AND HEMATOCRIT, BLOOD
HCT: 27.6 % — ABNORMAL LOW (ref 36.0–46.0)
Hemoglobin: 8.9 g/dL — ABNORMAL LOW (ref 12.0–15.0)

## 2010-08-08 LAB — DIGOXIN LEVEL: Digoxin Level: 0.7 ng/mL — ABNORMAL LOW (ref 0.8–2.0)

## 2010-08-08 LAB — URINE MICROSCOPIC-ADD ON

## 2010-08-08 LAB — URINALYSIS, ROUTINE W REFLEX MICROSCOPIC
Bilirubin Urine: NEGATIVE
Nitrite: NEGATIVE
Specific Gravity, Urine: 1.04 — ABNORMAL HIGH (ref 1.005–1.030)
Urobilinogen, UA: 0.2 mg/dL (ref 0.0–1.0)

## 2010-08-08 LAB — LIPASE, BLOOD: Lipase: 55 U/L (ref 11–59)

## 2010-08-08 LAB — ABO/RH: ABO/RH(D): O NEG

## 2010-08-25 ENCOUNTER — Encounter: Payer: Self-pay | Admitting: *Deleted

## 2010-08-25 LAB — DIFFERENTIAL
Basophils Absolute: 0.1 10*3/uL (ref 0.0–0.1)
Basophils Relative: 1 % (ref 0–1)
Eosinophils Absolute: 0.2 10*3/uL (ref 0.0–0.7)
Eosinophils Absolute: 0.1 10*3/uL (ref 0.0–0.7)
Eosinophils Relative: 1 % (ref 0–5)
Lymphocytes Relative: 16 % (ref 12–46)
Lymphs Abs: 1.9 10*3/uL (ref 0.7–4.0)
Monocytes Absolute: 1.1 10*3/uL — ABNORMAL HIGH (ref 0.1–1.0)
Monocytes Absolute: 0.5 10*3/uL (ref 0.1–1.0)
Monocytes Relative: 9 % (ref 3–12)

## 2010-08-25 LAB — URINALYSIS, DIPSTICK ONLY
Ketones, ur: NEGATIVE mg/dL
Protein, ur: NEGATIVE mg/dL
Urobilinogen, UA: 0.2 mg/dL (ref 0.0–1.0)

## 2010-08-25 LAB — CBC
HCT: 31.3 % — ABNORMAL LOW (ref 36.0–46.0)
HCT: 32.2 % — ABNORMAL LOW (ref 36.0–46.0)
HCT: 37.1 % (ref 36.0–46.0)
Hemoglobin: 11 g/dL — ABNORMAL LOW (ref 12.0–15.0)
Hemoglobin: 11.1 g/dL — ABNORMAL LOW (ref 12.0–15.0)
Hemoglobin: 12.8 g/dL (ref 12.0–15.0)
MCHC: 34.4 g/dL (ref 30.0–36.0)
MCHC: 35.4 g/dL (ref 30.0–36.0)
MCHC: 34.7 g/dL (ref 30.0–36.0)
MCV: 93.9 fL (ref 78.0–100.0)
MCV: 93.9 fL (ref 78.0–100.0)
MCV: 94.1 fL (ref 78.0–100.0)
MCV: 94.7 fL (ref 78.0–100.0)
MCV: 94.8 fL (ref 78.0–100.0)
MCV: 95 fL (ref 78.0–100.0)
MCV: 95.2 fL (ref 78.0–100.0)
MCV: 95.3 fL (ref 78.0–100.0)
Platelets: 468 10*3/uL — ABNORMAL HIGH (ref 150–400)
Platelets: 301 10*3/uL (ref 150–400)
Platelets: 327 10*3/uL (ref 150–400)
Platelets: 334 10*3/uL (ref 150–400)
Platelets: 344 10*3/uL (ref 150–400)
Platelets: 370 10*3/uL (ref 150–400)
Platelets: 388 10*3/uL (ref 150–400)
RBC: 2.76 MIL/uL — ABNORMAL LOW (ref 3.87–5.11)
RBC: 3.33 MIL/uL — ABNORMAL LOW (ref 3.87–5.11)
RBC: 3.35 MIL/uL — ABNORMAL LOW (ref 3.87–5.11)
RBC: 3.67 MIL/uL — ABNORMAL LOW (ref 3.87–5.11)
RBC: 3.86 MIL/uL — ABNORMAL LOW (ref 3.87–5.11)
RDW: 12.9 % (ref 11.5–15.5)
RDW: 13.2 % (ref 11.5–15.5)
RDW: 13.3 % (ref 11.5–15.5)
WBC: 10.8 10*3/uL — ABNORMAL HIGH (ref 4.0–10.5)
WBC: 12.6 10*3/uL — ABNORMAL HIGH (ref 4.0–10.5)
WBC: 10.6 10*3/uL — ABNORMAL HIGH (ref 4.0–10.5)
WBC: 12.9 10*3/uL — ABNORMAL HIGH (ref 4.0–10.5)
WBC: 15.2 10*3/uL — ABNORMAL HIGH (ref 4.0–10.5)
WBC: 8.3 10*3/uL (ref 4.0–10.5)
WBC: 8.6 10*3/uL (ref 4.0–10.5)

## 2010-08-25 LAB — URINALYSIS, MICROSCOPIC ONLY
Bilirubin Urine: NEGATIVE
Ketones, ur: 15 mg/dL — AB
Nitrite: NEGATIVE
Specific Gravity, Urine: 1.018 (ref 1.005–1.030)
Urobilinogen, UA: 1 mg/dL (ref 0.0–1.0)
pH: 7 (ref 5.0–8.0)

## 2010-08-25 LAB — BASIC METABOLIC PANEL
BUN: 6 mg/dL (ref 6–23)
BUN: 10 mg/dL (ref 6–23)
BUN: 12 mg/dL (ref 6–23)
BUN: 5 mg/dL — ABNORMAL LOW (ref 6–23)
BUN: 7 mg/dL (ref 6–23)
BUN: 8 mg/dL (ref 6–23)
BUN: 9 mg/dL (ref 6–23)
CO2: 24 mEq/L (ref 19–32)
CO2: 25 mEq/L (ref 19–32)
CO2: 22 mEq/L (ref 19–32)
CO2: 25 mEq/L (ref 19–32)
Calcium: 8.4 mg/dL (ref 8.4–10.5)
Calcium: 8.8 mg/dL (ref 8.4–10.5)
Calcium: 8.9 mg/dL (ref 8.4–10.5)
Chloride: 105 mEq/L (ref 96–112)
Chloride: 106 mEq/L (ref 96–112)
Chloride: 111 mEq/L (ref 96–112)
Chloride: 101 mEq/L (ref 96–112)
Chloride: 104 mEq/L (ref 96–112)
Chloride: 107 mEq/L (ref 96–112)
Chloride: 107 mEq/L (ref 96–112)
Creatinine, Ser: 0.9 mg/dL (ref 0.4–1.2)
Creatinine, Ser: 0.78 mg/dL (ref 0.4–1.2)
Creatinine, Ser: 0.8 mg/dL (ref 0.4–1.2)
Creatinine, Ser: 0.8 mg/dL (ref 0.4–1.2)
Creatinine, Ser: 0.82 mg/dL (ref 0.4–1.2)
Creatinine, Ser: 0.86 mg/dL (ref 0.4–1.2)
GFR calc Af Amer: >60 mL/min (ref >60)
GFR calc Af Amer: >60 mL/min (ref >60)
GFR calc Af Amer: >60 mL/min (ref >60)
GFR calc Af Amer: >60 mL/min (ref >60)
GFR calc Af Amer: >60 mL/min (ref >60)
GFR calc non Af Amer: >60 mL/min (ref >60)
GFR calc non Af Amer: >60 mL/min (ref >60)
Glucose, Bld: 92 mg/dL (ref 70–99)
Glucose, Bld: 112 mg/dL — ABNORMAL HIGH (ref 70–99)
Glucose, Bld: 125 mg/dL — ABNORMAL HIGH (ref 70–99)
Glucose, Bld: 99 mg/dL (ref 70–99)
Potassium: 3.5 mEq/L (ref 3.5–5.1)
Potassium: 3.9 mEq/L (ref 3.5–5.1)
Potassium: 4 mEq/L (ref 3.5–5.1)
Potassium: 3.7 mEq/L (ref 3.5–5.1)
Sodium: 137 mEq/L (ref 135–145)
Sodium: 138 mEq/L (ref 135–145)
Sodium: 140 mEq/L (ref 135–145)
Sodium: 137 mEq/L (ref 135–145)

## 2010-08-25 LAB — D-DIMER, QUANTITATIVE
D-Dimer, Quant: 1.76 ug/mL-FEU — ABNORMAL HIGH (ref 0.00–0.48)
D-Dimer, Quant: 0.26 ug/mL-FEU (ref 0.00–0.48)

## 2010-08-25 LAB — PROTIME-INR: Prothrombin Time: 13.8 seconds (ref 11.6–15.2)

## 2010-08-25 LAB — COMPREHENSIVE METABOLIC PANEL
ALT: 53 U/L — ABNORMAL HIGH (ref 0–35)
AST: 155 U/L — ABNORMAL HIGH (ref 0–37)
CO2: 23 mEq/L (ref 19–32)
Calcium: 8.7 mg/dL (ref 8.4–10.5)
GFR calc Af Amer: >60 mL/min (ref >60)
Sodium: 135 mEq/L (ref 135–145)
Total Protein: 6.2 g/dL (ref 6.0–8.3)

## 2010-08-25 LAB — POCT I-STAT 3, ART BLOOD GAS (G3+)
O2 Saturation: 93 %
pCO2 arterial: 38.9 mmHg (ref 35.0–45.0)
pH, Arterial: 7.35 (ref 7.350–7.400)

## 2010-08-25 LAB — URINE CULTURE

## 2010-08-25 LAB — HEMOGLOBIN A1C
Hgb A1c MFr Bld: 5.7 % (ref 4.6–6.1)
Mean Plasma Glucose: 117 mg/dL
Mean Plasma Glucose: 123 mg/dL

## 2010-08-25 LAB — LIPID PANEL
HDL: 30 mg/dL — ABNORMAL LOW (ref >39)
LDL Cholesterol: 72 mg/dL (ref 0–99)
LDL Cholesterol: 95 mg/dL (ref 0–99)
Total CHOL/HDL Ratio: 3.8 RATIO
Total CHOL/HDL Ratio: 4.6 RATIO
Total CHOL/HDL Ratio: 4.7 RATIO
Triglycerides: 187 mg/dL — ABNORMAL HIGH (ref <150)
VLDL: 16 mg/dL (ref 0–40)
VLDL: 26 mg/dL (ref 0–40)
VLDL: 37 mg/dL (ref 0–40)

## 2010-08-25 LAB — CK TOTAL AND CKMB (NOT AT ARMC)
CK, MB: 172.8 ng/mL — ABNORMAL HIGH (ref 0.3–4.0)
CK, MB: 212.3 ng/mL — ABNORMAL HIGH (ref 0.3–4.0)
CK, MB: 5.7 ng/mL — ABNORMAL HIGH (ref 0.3–4.0)
Relative Index: 12.5 — ABNORMAL HIGH (ref 0.0–2.5)
Relative Index: 3.3 — ABNORMAL HIGH (ref 0.0–2.5)
Total CK: 1944 U/L — ABNORMAL HIGH (ref 7–177)
Total CK: 1950 U/L — ABNORMAL HIGH (ref 7–177)

## 2010-08-25 LAB — MAGNESIUM
Magnesium: 2 mg/dL (ref 1.5–2.5)
Magnesium: 2.2 mg/dL (ref 1.5–2.5)

## 2010-08-25 LAB — CULTURE, BLOOD (SINGLE): Culture: NO GROWTH

## 2010-08-25 LAB — URINALYSIS, ROUTINE W REFLEX MICROSCOPIC
Bilirubin Urine: NEGATIVE
Glucose, UA: NEGATIVE mg/dL
Ketones, ur: NEGATIVE mg/dL
Protein, ur: NEGATIVE mg/dL
pH: 6 (ref 5.0–8.0)

## 2010-08-25 LAB — HEPATIC FUNCTION PANEL
ALT: 23 U/L (ref 0–35)
ALT: 60 U/L — ABNORMAL HIGH (ref 0–35)
Alkaline Phosphatase: 56 U/L (ref 39–117)
Bilirubin, Direct: 0.1 mg/dL (ref 0.0–0.3)
Bilirubin, Direct: 0.2 mg/dL (ref 0.0–0.3)
Indirect Bilirubin: 0.5 mg/dL (ref 0.3–0.9)
Indirect Bilirubin: 0.6 mg/dL (ref 0.3–0.9)
Total Bilirubin: 0.7 mg/dL (ref 0.3–1.2)
Total Protein: 6.4 g/dL (ref 6.0–8.3)

## 2010-08-25 LAB — POCT CARDIAC MARKERS
CKMB, poc: 2.8 ng/mL (ref 1.0–8.0)
Troponin i, poc: <0.05 ng/mL (ref 0.00–0.09)

## 2010-08-25 LAB — CARDIAC PANEL(CRET KIN+CKTOT+MB+TROPI)
CK, MB: 4.6 ng/mL — ABNORMAL HIGH (ref 0.3–4.0)
Relative Index: 3.2 — ABNORMAL HIGH (ref 0.0–2.5)
Total CK: 1129 U/L — ABNORMAL HIGH (ref 7–177)
Total CK: 144 U/L (ref 7–177)
Troponin I: 15.73 ng/mL (ref 0.00–0.06)

## 2010-08-25 LAB — LIPASE, BLOOD: Lipase: 36 U/L (ref 11–59)

## 2010-08-25 LAB — HOMOCYSTEINE: Homocysteine: 9.5 umol/L (ref 4.0–15.4)

## 2010-08-25 LAB — POCT I-STAT, CHEM 8
Chloride: 105 mEq/L (ref 96–112)
Creatinine, Ser: 0.7 mg/dL (ref 0.4–1.2)
Hemoglobin: 11.9 g/dL — ABNORMAL LOW (ref 12.0–15.0)
Potassium: 3.5 mEq/L (ref 3.5–5.1)
Sodium: 137 mEq/L (ref 135–145)

## 2010-08-25 LAB — URINE MICROSCOPIC-ADD ON

## 2010-08-25 LAB — AMYLASE: Amylase: 97 U/L (ref 27–131)

## 2010-08-25 LAB — APTT: aPTT: 40 seconds — ABNORMAL HIGH (ref 24–37)

## 2010-08-25 LAB — TROPONIN I: Troponin I: 0.4 ng/mL — ABNORMAL HIGH (ref 0.00–0.06)

## 2010-09-01 ENCOUNTER — Encounter: Payer: Self-pay | Admitting: *Deleted

## 2010-09-17 ENCOUNTER — Emergency Department (HOSPITAL_COMMUNITY): Payer: Managed Care, Other (non HMO)

## 2010-09-17 ENCOUNTER — Emergency Department (HOSPITAL_COMMUNITY)
Admission: EM | Admit: 2010-09-17 | Discharge: 2010-09-17 | Disposition: A | Discharge status: Home / Self Care | Payer: Managed Care, Other (non HMO) | Attending: Emergency Medicine | Admitting: Emergency Medicine

## 2010-09-17 DIAGNOSIS — I252 Old myocardial infarction: Secondary | ICD-10-CM | POA: Insufficient documentation

## 2010-09-17 DIAGNOSIS — Z79899 Other long term (current) drug therapy: Secondary | ICD-10-CM | POA: Insufficient documentation

## 2010-09-17 DIAGNOSIS — R079 Chest pain, unspecified: Secondary | ICD-10-CM | POA: Insufficient documentation

## 2010-09-17 DIAGNOSIS — R142 Eructation: Secondary | ICD-10-CM | POA: Insufficient documentation

## 2010-09-17 DIAGNOSIS — R112 Nausea with vomiting, unspecified: Secondary | ICD-10-CM | POA: Insufficient documentation

## 2010-09-17 DIAGNOSIS — K2970 Gastritis, unspecified, without bleeding: Secondary | ICD-10-CM | POA: Insufficient documentation

## 2010-09-17 DIAGNOSIS — K2990 Gastroduodenitis, unspecified, without bleeding: Secondary | ICD-10-CM | POA: Insufficient documentation

## 2010-09-17 DIAGNOSIS — E119 Type 2 diabetes mellitus without complications: Secondary | ICD-10-CM | POA: Insufficient documentation

## 2010-09-17 DIAGNOSIS — R1013 Epigastric pain: Secondary | ICD-10-CM | POA: Insufficient documentation

## 2010-09-17 DIAGNOSIS — R141 Gas pain: Secondary | ICD-10-CM | POA: Insufficient documentation

## 2010-09-17 DIAGNOSIS — I2510 Atherosclerotic heart disease of native coronary artery without angina pectoris: Secondary | ICD-10-CM | POA: Insufficient documentation

## 2010-09-17 LAB — DIFFERENTIAL
Basophils Relative: 1 % (ref 0–1)
Lymphs Abs: 1.7 10*3/uL (ref 0.7–4.0)
Monocytes Absolute: 0.6 10*3/uL (ref 0.1–1.0)
Monocytes Relative: 5 % (ref 3–12)
Neutro Abs: 10 10*3/uL — ABNORMAL HIGH (ref 1.7–7.7)
Neutrophils Relative %: 79 % — ABNORMAL HIGH (ref 43–77)

## 2010-09-17 LAB — POCT CARDIAC MARKERS
CKMB, poc: 1.7 ng/mL (ref 1.0–8.0)
Myoglobin, poc: 52.1 ng/mL (ref 12–200)
Myoglobin, poc: 58.3 ng/mL (ref 12–200)
Troponin i, poc: <0.05 ng/mL (ref 0.00–0.09)

## 2010-09-17 LAB — COMPREHENSIVE METABOLIC PANEL
ALT: 32 U/L (ref 0–35)
AST: 33 U/L (ref 0–37)
Albumin: 3.9 g/dL (ref 3.5–5.2)
Alkaline Phosphatase: 78 U/L (ref 39–117)
CO2: 26 mEq/L (ref 19–32)
Chloride: 98 mEq/L (ref 96–112)
Creatinine, Ser: 0.71 mg/dL (ref 0.4–1.2)
GFR calc Af Amer: >60 mL/min (ref >60)
GFR calc non Af Amer: >60 mL/min (ref >60)
Potassium: 3.7 mEq/L (ref 3.5–5.1)
Sodium: 136 mEq/L (ref 135–145)
Total Bilirubin: 0.6 mg/dL (ref 0.3–1.2)

## 2010-09-17 LAB — CBC
HCT: 37.8 % (ref 36.0–46.0)
Hemoglobin: 12.9 g/dL (ref 12.0–15.0)
MCH: 29.5 pg (ref 26.0–34.0)
MCHC: 34.1 g/dL (ref 30.0–36.0)
MCV: 86.3 fL (ref 78.0–100.0)
RBC: 4.38 MIL/uL (ref 3.87–5.11)

## 2010-09-27 NOTE — Discharge Summary (Signed)
NAMECROSBY, BEVAN NO.:  [id number]   MEDICAL RECORD NO.:  [id number]          PATIENT TYPE:  INP   LOCATION:  2036                         FACILITY:  [facility]   PHYSICIAN:  Arturo Morton. Riley Kill, MD, FACCDATE OF BIRTH:  21-Jun-1960   DATE OF ADMISSION:  07/19/2008  DATE OF DISCHARGE:  07/22/2008                               DISCHARGE SUMMARY   PRIMARY CARDIOLOGIST:  Luis Abed, MD, [facility]   PRIMARY MEDICAL DOCTOR:  Dr. Ardelle Park, in [place].   DISCHARGE DIAGNOSIS:  Tako-tsubo variant (stress-induced  cardiomyopathy).   SECONDARY DIAGNOSES:  1. Hypertension.  2. Gastroesophageal reflux disease.  3. Peptic ulcer disease.  4. History of anxiety.  5. Tobacco abuse disorder.   ALLERGIES:  PENICILLIN.   PROCEDURES PERFORMED DURING THIS HOSPITALIZATION:  1. On July 19, 2008, the patient had a chest x-ray that showed no      acute thoracic findings.  2. EKG performed on July 19, 2008, that showed normal sinus rhythm      with T-wave inversion in leads V3-V6.  No significant Q-waves.      Normal axis.  No evidence of hypertrophy.  3. Cardiac catheterization on July 20, 2008, that showed:      a.     Normal coronaries.      b.     Mild left ventricular dysfunction with apical hypokinesis.      c.     Mild aortic plaquing with no evidence of abdominal aortic       aneurysm.  Renal arteries are patent.  (Suspect mild Tako-tsubo       variant), (stress-induced cardiomyopathy).  4. 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      There was mild mitral valvular regurgitation.  5. EKG performed on July 22, 2008, that showed normal sinus rhythm      with left T-wave inversion in the anterolateral leads.  No      significant Q-waves.  Normal axis.  No evidence of hypertrophy.   BRIEF HISTORY OF PRESENT  ILLNESS:  This is a 50 year old obese Caucasian  female with a history of GERD, peptic ulcer disease, and hypertension  who has not been feeling well for the past couple of days.  She has been  feeling somewhat nauseous and reports diarrhea yesterday, which has  resolved today.  However, she became concerned when today with sudden  onset of chest pressure and burning, radiating through to her back and  to her neck, associated with severe diaphoresis and shortness of breath.  She also reports presyncope.  All of these symptoms lasting for only a  few minutes.  She called EMS who checked her vital signs and did an EKG  and felt both were within normal limits, but advised her to be evaluated  in the emergency room.  The patient was feeling nearly back to baseline  after 30 minutes, but reported to [facility] Emergency  Room for  evaluation anyway on the advice of the EMS.  While during her evaluation  in the ED, she was asymptomatic, but persistently tachycardic with EKG  changes described above.  The patient denies history of vomiting or  frank syncope.  She has no past history of coronary artery disease.  She  does smoke 1/2 pack of cigarettes per day and drinks 1 pint of vodka per  week.  She reports her mother had a stroke at age 47 and coronary artery  disease at an early age, and also history of hypertension.  The patient  was originally admitted to the [facility].   HOSPITAL COURSE:  The patient was admitted on July 19, 2008, and her  initial point-of-care markers were negative.  However, at 2:45 a.m. on  July 20, 2008, she had a full set of cardiac enzymes that were positive.  CK-MB was 5.7, relative index 3.3, and troponin-I of 0.40.  Cardiac  enzymes were cycled and they were downtrending, however, Cardiology was  consulted.  On July 20, 2008, the patient had a 2-D echocardiogram  completed (see results above).  The patient also had cardiac  catheterization performed on July 20, 2008, (see results above).  The  patient tolerated the procedure well without significant complications.  The patient's vital signs remained stable during her hospital course  with mild tachycardia and due to the diagnosis of mild Tako-tsubo  (stress-induced cardiomyopathy) being treated with ACE inhibitor and  beta-blocker, it was thought that her rate should continued to downtrend  as it did during the final day of her hospital course.  The patient will  have a followup visit with Dr. Myrtis Ser on August 12, 2008, and will continue  on her beta-blocker and ACE inhibitor as well as low-dose aspirin and  her prior home medications until that time.  The patient will have basic  metabolic panel drawn 2 days prior to this visit.  At the time of  discharge, the patient was asymptomatic with no questions or concerns  that had not been addressed.  The patient also received smoking  cessation counseling while inpatient and will be discharged with 40-mg  nicotine patch.  She has also been encouraged to follow up with her  primary care physician in the next 2-3 weeks regarding ongoing treatment  of her prior comorbidities.  The patient received her new medication  list, prescriptions, followup instructions, and post cath instructions  in both oral and written form.  Most recent vital signs on the day of  discharge, temp 98.7 degrees Fahrenheit, BP 127/91, pulse 91,  respirations 18, and O2 saturation 97% on room air.   DISCHARGE LABORATORY DATA:  WBC 8.3, HGB 11.9, HCT 33.8, and PLT count  301.  Sodium 139, potassium 3.6, chloride 112, CO2 24, BUN 7, creatinine  0.78, glucose 103, and calcium 8.8.  Hemoglobin A1c 5.7.  Last set of  cardiac markers, CK 117, CK-MB 3.1, troponin-I 0.07, total cholesterol  139, triglycerides 187, HDL 30, LDL 72, VLDL 37.  TSH 2.032.  Homocystine 9.5.  The patient had a urinalysis that was all within  normal limits except for blood (small) and squamous epithelial/LPF   (few).  Magnesium 2.0.   FOLLOWUP PLANS AND APPOINTMENTS:  1. Appointment with [facility] for basic metabolic panel lab      draw on August 10, 2008, anytime of the day.  2. Followup appointment with Dr. Myrtis Ser On August 12, 2008, at 9:45 a.m.  DISCHARGE MEDICATIONS:  1. Aspirin 81 mg p.o. daily.  2. Coreg 6.25 mg p.o. b.i.d.  3. Lisinopril 10 mg p.o. daily.  4. Prevacid 30 mg p.o. daily.  5. Xanax 0.5 mg p.o. (as previously prescribed).  6. Nitroglycerin 0.4 mg sublingual p.r.n. for chest pain.   Duration of discharge encounter including physician time was 45 minutes.      Jarrett Ables, PAC      Arturo Morton. Riley Kill, MD, [facility]  Electronically Signed    MS/MEDQ  D:  07/22/2008  T:  07/23/2008  Job:  [id number]   cc:   Luis Abed, MD, [facility]  Dr. Ardelle Park

## 2010-09-27 NOTE — H&P (Signed)
NAMEKRISTIEN, Emily Mays               ACCOUNT NO.:  [id number]   MEDICAL RECORD NO.:  [id number]           PATIENT TYPE:  INP   LOCATION:                               FACILITY:  [facility]   PHYSICIAN:  Bevelyn Buckles. Bensimhon, MDDATE OF BIRTH:  1960/07/28   DATE OF ADMISSION:  07/24/2008  DATE OF DISCHARGE:                              HISTORY & PHYSICAL   PRIMARY CARE PHYSICIAN:  Dr. Ardelle Park in [place].   REASON FOR ADMISSION:  Anterior wall myocardial infarction.   Ms. Kozinski is a very pleasant 50 year old woman with a history of  hypertension and gastroesophageal reflux disease and tobacco use.  She  was admitted last week on July 20, 2008 with chest pain.  She ended up  ruling in for non-ST elevation myocardial infarction with a troponin of  0.4.  An EKG showed anterolateral T-wave inversions.  An echocardiogram  showed an EF of 50-55% with akinesis of the distal inferior wall and  aneurysmal dilatation of the inferior apex.  She underwent  catheterization which showed a left dominant system.  There were  essentially normal coronaries.  There was an apical wall motion  abnormality thought to be consistent with variant of Tako-Tsubo  cardiomyopathy.   She went home on the 10th and was doing fine.  However, today, when  taking a nap around 3 o'clock, she experienced severe chest pain.  She  called EMS.  On arrival, the EKG in the field showed anterior ST  elevation.  She was brought emergently to the cath lab. She was still  complaining of 5/10 chest pain.   REVIEW OF SYSTEMS:  She denies any fevers or chills.  No nausea or  vomiting.  She has not had any exertional angina.  No bleeding.  No  focal neurologic signs.  The remainder of the review of systems is  negative except for HPI and problem list.   PAST MEDICAL HISTORY:  1. Questionable Tako-Tsubo cardiomyopathy several days ago.  2. Hypertension.  3. Tobacco use.  4. Gastroesophageal reflux disease.   MEDICATIONS:  1. Aspirin  81 a day.  2. Coreg 6.25 b.i.d.  3. Lisinopril 10 a day.  4. Prevacid 30 a day.  5. Xanax 0.5 p.r.n.  6. Nitroglycerin p.r.n.Marland Kitchen   ALLERGIES:  To PENICILLIN.   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  Smokes a half-pack to a pack a day, but she has not smoked since her  discharge.  She lives in [place] with a roommate.  Occasional  alcohol.   FAMILY HISTORY:  Mother is alive and has an angioplasty.  Coronary  artery disease with previous angioplasty.  No coronary artery disease in  the siblings or her father.   PHYSICAL EXAM:  She is lying flat on the stretcher complaining of chest  pain.  Blood pressure was 150/110, mean of 127, heart rate was 89.  She was  saturating in the high 90s on nasal cannula oxygen.  HEENT is normal.  NECK: Supple.  No obvious JVD.  Carotids are 2+ bilaterally with no  obvious bruits.  There is no lymphadenopathy or thyromegaly appreciated.  CARDIAC:  PMI is not palpable.  She is regular with no obvious murmurs.  LUNGS: Clear.  ABDOMEN:  Obese, nontender, nondistended.  No hepatosplenomegaly, no  bruits.  No masses appreciated.  EXTREMITIES:  Warm with no cyanosis, clubbing or edema.  Distal pulses  are 2+ bilaterally.  NEURO:  Alert and oriented x3.  She is in pain.  Cranial nerves II-XII  are grossly intact.  Moves all four extremities.   LABS:  Are pending.   EMS EKG shows sinus rhythm with anterior ST elevation, anterior injury,  current.   ASSESSMENT:  Acute anterior myocardial infarction as described above.   PLAN:  She will go for emergent cardiac catheterization.      Bevelyn Buckles. Bensimhon, MD  Electronically Signed     DRB/MEDQ  D:  07/24/2008  T:  07/24/2008  Job:  [id number]

## 2010-09-27 NOTE — Cardiovascular Report (Signed)
NAMESHAKELA, DONATI NO.:  [id number]   MEDICAL RECORD NO.:  [id number]          PATIENT TYPE:  INP   LOCATION:  2904                         FACILITY:  [facility]   PHYSICIAN:  Emily Mays, MDDATE OF BIRTH:  Jun 27, 1960   DATE OF PROCEDURE:  DATE OF DISCHARGE:                            CARDIAC CATHETERIZATION   PRIMARY CARE PHYSICIAN:  Dr. Donnel Saxon.   PATIENT IDENTIFICATION:  Emily Mays is a 50 year old woman with a  history of hypertension and tobacco use.  She presented last week with  chest pain and mildly elevated troponin of 0.4.  EKG showed  anterolateral T-wave inversion.  She was brought to the cath lab which  showed essentially normal coronary arteries with apical wall motion  abnormality thought to be a variant of takotsubo cardiomyopathy.  She  was discharged home on July 22, 2008.  Since that time, she had been  doing well.  She presented today with acute chest pain beginning at 3:00  p.m.  EMS arrived and there was anterior ST-elevation in V1 through V3.  She was brought emergently to the catheterization lab.   PROCEDURES PERFORMED:  Selective coronary angiography.   DESCRIPTION OF PROCEDURE:  The risks and indication were explained.  Consent was signed and placed on the chart.  A 6-French arterial sheath  was placed in the right femoral artery using a modified Seldinger  technique.  Standard catheters including a JL-4, JR-4, and angled  pigtail were used.  All catheter exchange was made over a wire.  There  are no apparent complications.  Central aortic pressure is 158/110, mean  of 127.   Left main was normal.   LAD had diffuse 50-60% lesion proximally.  They gave off 2 diagonals and  then was totally occluded in the midsection looked to be diffuse  dissection in the LAD.   Left circumflex was a large dominant vessel with a large OM1,  posterolateral, and a PDA which was angiographically normal.  The RCA  was not injected as this  was previously shown to be a small nondominant  artery.  No LV gram was performed.   ASSESSMENT:  Coronary artery disease with likely diffuse left anterior  descending dissection.  Left circumflex normal.   Plan will be for an emergent PCI.     Emily Buckles. Bensimhon, MD  Electronically Signed    DRB/MEDQ  D:  07/24/2008  T:  07/25/2008  Job:  [id number]   cc:   Donnel Saxon

## 2010-09-27 NOTE — H&P (Signed)
Emily Mays, DELAINE NO.:  [id number]   MEDICAL RECORD NO.:  [id number]          PATIENT TYPE:  INP   LOCATION:  5524                         FACILITY:  [facility]   PHYSICIAN:  Vania Rea, M.D. DATE OF BIRTH:  05-13-1961   DATE OF ADMISSION:  07/19/2008  DATE OF DISCHARGE:                              HISTORY & PHYSICAL   PRIMARY CARE PHYSICIAN:  Dr. Ardelle Park in [place].   CHIEF COMPLAINT:  Presyncopal episode.   HISTORY OF PRESENT ILLNESS:  This is a 50 year old obese Caucasian lady  with a history of GERD, peptic ulcer disease and hypertension who has  not been feeling quite well for the past two days, has been feeling  somewhat nauseous, had a full day of diarrhea yesterday which resolved  today, but now complains of sudden onset of chest pressure and burning,  radiating through to her back and upper throat to her neck associated  with drenching sweat and difficulty breathing, feeling as if she was  about to pass out, feeling lasted just for a few minutes.  They called  EMS who checked her vitals and reportedly did an EKG, felt it was normal  and felt the patient was not in any acute danger, but advised her to get  checked up in the emergency room.  After about 30 minutes, the patient  felt okay but came to the [facility] emergency room to be evaluated.  Since being in the emergency room, the patient has had no further pain.  However, she has been persistently tachycardic with an abnormal EKG and  the hospitalist service has been called to assist with management.  The  patient gives no history of vomiting or frank syncope.  She has no past  history of coronary artery disease.  She does smoke half a pack per day  and drinks one pint of Vodka for a week and she has mother who had a  stroke at age 57, coronary artery disease at an early age, and is also  hypertensive.   PAST MEDICAL HISTORY:  1. GERD.  2. Peptic ulcer disease.  3. Hypertension.   MEDICATIONS:  1. Prevacid 30 mg daily.  2. Xanax 0.5 mg p.r.n. usually weekly.   ALLERGIES:  PENICILLIN.   SOCIAL HISTORY:  Half a pack of tobacco per day.  One pint of Vodka per  week.  Denies elicit drug use.  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   FAMILY HISTORY:  Other than noted above, significant also father with  bladder cancer.   REVIEW OF SYSTEMS:  Other than noted above, a 10-point review of systems  is unremarkable .   PHYSICAL EXAMINATION:  GENERAL:  An obese, middle aged, Caucasian lady  reclining in the stretcher.  She gives her height as 5 feet 4 inches,  weight 180 pounds, temperature 97.5, pulse 103, respirations 18, blood  pressure 144/88, saturating 99% on room air.  HEENT:  Pupils are round and equal, mucous membranes pink and anicteric.  She is mildly dehydrated, no cervical lymphadenopathy or thyromegaly.  No jugular venous distention.  No  carotid bruits.  CHEST:  Clear to auscultation bilaterally.  CARDIOVASCULAR:  She is tachycardic, no murmur.  She has no reducible  chest wall tenderness.  ABDOMEN:  Obese and soft.  She has no epigastric or other tenderness.  There are no masses.  EXTREMITIES:  Without edema.  She has 2+ pulses bilaterally.  She has no  dilated veins.  CENTRAL NERVOUS SYSTEM:  Cranial nerves II-XII grossly intact.  No focal  neurological deficits.   LABORATORY DATA:  White count is slightly elevated at 11.5.  She has 77%  neutrophils, and her absolute granulocyte count is elevated at 8.9.  Her  CBC is otherwise unremarkable.  Serum chemistry is completely normal  with normal potassium, BUN 12, creatinine 8.4.  Cardiac enzymes are  completely normal with undetectable troponins.  D. dimer is likewise  completely normal at 0.26.  A 2-view chest x-ray shows no acute finding.   ASSESSMENT:  1. Atypical chest pain.  Differential diagnosis includes      gastroesophageal reflux.  2. Coronary artery disease.  3. Viral  syndrome.  4. Leukocytosis, possibly from a viral syndrome or acute coronary      syndrome.  5. Pulmonary embolus unlikely with a negative D. dimer.  6. Obesity with a BMI greater than 30.   PLAN:  Will bring this lady in overnight for IV fluids, serial cardiac  enzymes and Cardiology consult for cardiac stress testing.  Advised her  on weight loss, alcohol and tobacco cessation.  Other plans as per  orders.      Vania Rea, M.D.  Electronically Signed     LC/MEDQ  D:  07/20/2008  T:  07/20/2008  Job:  [id number]   cc:   Dr. Ardelle Park, Rosalita Levan

## 2010-09-27 NOTE — Cardiovascular Report (Signed)
Emily Mays, Emily Mays               ACCOUNT NO.:  [id number]   MEDICAL RECORD NO.:  [id number]          PATIENT TYPE:  INP   LOCATION:  3706                         FACILITY:  [facility]   PHYSICIAN:  Arturo Morton. Riley Kill, MD, FACCDATE OF BIRTH:  11-10-1960   DATE OF PROCEDURE:  07/24/2008  DATE OF DISCHARGE:  07/31/2008                            CARDIAC CATHETERIZATION   INDICATIONS:  Ms. Aitken is a 50 year old, who presented earlier in the  week with chest pain, positive enzymes, and anterior T-wave changes.  She underwent diagnostic catheterization by Dr. Gala Romney demonstrating  no significant obstruction, and a mild wall motion abnormality that was  thought to be a Takotsubo variant.  She was treated medically, observed,  and subsequently sent home in stable condition.  She redeveloped chest  pain today, was seen in the emergency room by Dr. Gala Romney, who brought  her to the catheterization laboratory, and did emergent cardiac  catheterization.  The current catheterization demonstrates distal  occlusion of the LAD with a deformity of the LAD worrisome for  spontaneous coronary dissection.  Urgent intervention for possible  restoration of flow was recommended.  She was brought to the lab  emergently.  The patient was given bivalirudin.   PROCEDURES:  1. Percutaneous angioplasty of the distal left anterior descending      artery.  2. Intravascular ultrasound.  3. Percutaneous stenting of the ostium of the left anterior      descending.  4. Percutaneous stenting of the ostium of the circumflex.   DESCRIPTION OF PROCEDURE:  The patient had been brought emergently by  Dr. Gala Romney to the catheterization laboratory.  Antiplatelet agents  were administered.  The patient had an indwelling 6-French sheath.  With  this, the distal LAD was noted to be occluded, bivalirudin was given  according to protocol, and ACT checked and found to be appropriate.  We  have emergently placed a Prowater  wire down the vessel, and into the  distal LAD.  Multiple dilatations were then performed with an attempt to  try to reestablish flow.  A 1.5 x 12 and 1.5 x 20 apex balloons were  utilized as well as subsequently apex 2.0 x 15 balloon.  Despite  multiple attempts to open up the distal LAD, this was not very  successful, the LAD involving the 2 diagonals and up to the septal  perforators maintained, however, continued flow.  Intravascular  ultrasound was emergently performed as well, and this confirmed findings  suggestive of extensive LAD hematoma from distal to proximal suggesting  spontaneous LAD dissection as the initial inciting factor.  We have seen  this on several cases over the past year.  Multiple dilatations were  formed in the apical vessel.  There continued to be flow into the  majority of the LAD with flow into the diagonal and the septals.  Dr.  Juanda Chance, Dr. Excell Seltzer, and I all reviewed the films as well as the  intravascular ultrasound findings.  Despite only minimal restoration of  apical flow, with stabilization of the more proximal lumen, the initial  strategy was to try to treat this  conservatively.  The patient has a  dominant circumflex, and all of [place] agreed that this would be the best  initial strategy if we could maintain flow in the LAD without stenting  given the extensive hematoma.  However, the ostium was somewhat tighter  than the remainder of the mid LAD, and with time it became obvious that  flow was deteriorating in the ostium due to external compression.  The  surgical team was in the operating room performing an aortic dissection  procedure, but they were notified of the current situation.  However,  surgical options are limited in this situation.  With deterioration of  the proximal flow due to external compression, eventually we felt that  it was necessary to place a stent in the ostium.  The ostium was stented  using a 3.0 x 18 Endeavor stent, which was  deployed at 14 atmospheres.  This appeared to stabilize flow into the midvessel, although as  expected, there was a compression of external hematoma both forward and  unfortunately into the ostium of the circumflex.  The circumflex was a  large dominant vessel.  Dr. Excell Seltzer and I looked at the films, and given  the extent of the displaced hematoma into the ostium of the circumflex,  I promptly placed a second wire in the circumflex and performed  intravascular ultrasound as well.  We measured the extent of the  hematoma in the length, and it was felt that the best strategy at this  point would be you stent this to try to limit both proximal and distal  extension.  A 4.0 x 16 Liberte non-drug-eluting platform was then  deployed at approximately 10 atmospheres.  There was marked improvement  in the appearance of this lumen.  Neither of the stents were postdilated  as a basic underlying physiology is that of external compression from  hematoma, and it was felt that that would extensively displace hematoma.  Flow into the circumflex appeared to stabilize, and it is felt this  point we should hold off on further interventions.  The surgical team  was notified of her situation and at the time of completion of the  procedure, she was to be taken to the Coronary Care Unit for overnight  convalescence.  We elected to keep her gently anticoagulated overnight  with an indwelling sheath in case there was a deterioration in flow.  The plan was to remove the sheath in the a.m.  We also felt that repeat  catheterization in 1 week would be warranted to reassess the situation.  The patient's family was informed of this situation, and we reviewed the  films with the patient's mother in detail.   ANGIOGRAPHIC DATA:  1. The films from July 20, 2008, were reviewed in detail with Dr.      Excell Seltzer and Dr. Gala Romney.  On July 20, 2008, film, there was a      suggestion of small wall motion abnormality, and no  obvious      evidence of any dissection planes.  The film from July 24, 2008,      demonstrates a dominant circumflex without significant compromise.      The LAD is compromised from the ostium with substantial narrowing      at the ostium with some deformity of the lumen due to what is      evident by ultrasound of as external compression of the LAD.      Fortunately, the LAD is patent leading into 2  septals and 2      diagonal branches.  The vessel was occluded distally.  After      multiple dilatations into the distal vessel, there is some residual      flow into this region.  There is some displacement of hematoma into      the second diagonal ostium.  The angiographic study does      demonstrate consistent proximal compression with worsening      compression of the ostium and proximal LAD throughout the case      prior to stenting of the vessel.  After stenting of the vessel      proximally, there is flow into the LAD with continued evidence of      extrinsic compression.  After stenting of the LAD, there is      evidence by ultrasound of external compression of the proximal      circumflex by external hematoma that has been shifted from the      ostium of the LAD.  This resulted in substantial lumen compromise,      which was covered in both directions by stenting up to the large      bifurcation of the circumflex.  After stenting of the circumflex      with the large stent, the distal flow into the circumflex was      maintained and stabilized.  At the completion of the procedure, the      proximal LAD is open into the mid vessel.  There is flow into the      diagonals and septals.  The apical portion of the LAD has minimal      flow.  The large circumflex system has excellent flow into the      marginal and distal posterior vessels.  2. Intravascular ultrasound on the initial LAD demonstrates a      significantly compressed lumen with evidence of compression      throughout the  entire LAD with some maintenance of flow to the side      branches and connection to the true lumen.  The hematoma extends      from the most distal extent of the location that the ultrasound      catheter could reach all the way proximally into the proximal LAD.      At the location of the circumflex entry which appears to be a      common ostium, the hematoma ends.  The ultrasound of the circumflex      likewise after placement of the proximal LAD stent demonstrates      highly compressed lumen due to external hematoma, which is migrated      from the proximal LAD into the proximal circumflex.  As mentioned      previously, up to the bifurcation, we carefully measured the extent      of the hematoma in terms of length to see if we could contain the      proximal and distal migration of the hematoma.  The vessel sizing      was difficult, but we tried to place a large non-drug-eluting stent      in this location to the cover the extent of the hematoma.   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  2. Stabilization of flow into the left anterior descending artery with      stenting of the ostium.  3. External compression of the circumflex due to the migration of      proximal left anterior descending hematoma with successful stenting      of the ostium of the circumflex.  4. Continued occlusion of the apical portion of the left anterior      descending due to external compression from hematoma.   DISPOSITION:  The patient presented initially and underwent diagnostic  catheterization by Dr. Gala Romney earlier in the week.  This demonstrated  a patent LAD and circumflex and a small wall motion abnormality in  conjunction with electrocardiographic abnormalities.  This was thought  initially possibly it be a Takotsubo variant.  On representation, the  LAD was now extensively compromised, with external hematoma compressing   the lumen up and down the LAD suggestive of a spontaneous dissection.  The precise approach in these cases has been variable.  The initial  strategy was to try to stabilize flow and allow time for healing of the  LAD.  However, there was deterioration in flow through the ostium of the  LAD due to external compression, and stenting was required to the  stabilize flow into the distal LAD and as was our concern, resulted in  migration of hematoma into the proximal circumflex with compromise of  flow into the circumflex ostium.  Fortunately, this was stabilized.  The  surgical team has been notified.  The circumflex could potentially be  bypassed, although it be difficult to do anything to the LAD as has been  the case in our previous cases.  Restudy in another 3-4 days would be  recommended to document stabilization of the vessels, the patient will  need to be followed closely as an outpatient, and down the road either  repeat CT angiography and/or catheterization would likely be indicated  to document stabilization given the stent deployment techniques used for  this case.  Specifically, lumen size is difficult to determine, and with  subsequent healing there might be significant proximal malapposition  over time.  I will review this with Dr. Myrtis Ser.      Arturo Morton. Riley Kill, MD, [facility]  Electronically Signed     TDS/MEDQ  D:  08/08/2008  T:  08/08/2008  Job:  [id number]   cc:   CV Laboratory  Luis Abed, MD, [facility]

## 2010-09-27 NOTE — Cardiovascular Report (Signed)
Emily Mays, Emily Mays               ACCOUNT NO.:  [id number]   MEDICAL RECORD NO.:  [id number]          PATIENT TYPE:  INP   LOCATION:  3706                         FACILITY:  [facility]   PHYSICIAN:  Veverly Fells. Excell Seltzer, MD  DATE OF BIRTH:  Aug 16, 1960   DATE OF PROCEDURE:  DATE OF DISCHARGE:                            CARDIAC CATHETERIZATION   PROCEDURE:  Selective coronary angiography.   INDICATIONS:  Emily Mays is a 50 year old woman who presented with  acute myocardial infarction secondary to spontaneous dissection of the  LAD.  She was treated with PTCA and stenting of the proximal LAD under  the guidance of intravascular ultrasound.  There was hematoma that  entered the left circumflex and compromise that vessel that was also  treated with a stent.  The patient has slowly recovered and presents  today for re-look angiography to evaluate her LAD and left circumflex.  She has a dominant left circumflex.   PROCEDURAL DETAILS:  Risks and indications of procedure were reviewed  with the patient and informed consent was obtained.  Left groin was  prepped and draped and anesthetized with 1% lidocaine.  Using a modified  Seldinger technique, a 5-French sheath was placed in the left femoral  artery.  Standard 5-French Judkins catheters were used for coronary  angiography.  Intracoronary nitroglycerin was given.  The patient  tolerated the procedure well.  All catheter exchanges were performed  over a guidewire.   FINDINGS:  The left mainstem is very short.  The vessel was widely  patent.  It bifurcates into the LAD and left circumflex.   LAD:  There is a widely patent stent in the proximal LAD that begins  just beyond the ostium of that vessel.  Off the distal end of the stent,  the remaining portions of the LAD are diffusely narrowed.  There is TIMI  3 flow throughout the LAD.  The LAD supplies 3 diagonal branches, all  are patent.  The septal perforators are all patent.  The LAD was  totally  occluded in the distal aspect previously.  The vessel has now reopened.  The distal vessel is very attenuated and has the appearance of residual  dissection.   Left circumflex.  The left circumflex is widely patent.  There is a  stent that goes back to the origin of the circumflex.  The stented  segment is all widely patent.  Off the distal edge of the stent, there  is a small step down just before the bifurcation of the first OM and the  AV groove circumflex.  The OM and AV groove circumflex are widely patent  with no significant stenosis.  There is no vessel attenuation present.  The AV groove circumflex courses down to supply left PDA branch.   ASSESSMENT:  1. Widely patent left anterior descending stent with diffuse narrowing      of the left anterior descending beyond the stented segment.  2. Widely patent left circumflex stent with normal-appearing left      circumflex system.   PLAN:  I will continue the patient's medical therapy.  She will be  transferred to telemetry and start cardiac rehab tomorrow.  I think she  is in a fairly stable situation, and hopefully, we will move forward  with a recovery process.  It is reassuring that her LAD has remained  patent and would now expect healing of the dissection and hematoma over  the next several weeks.      Veverly Fells. Excell Seltzer, MD  Electronically Signed     MDC/MEDQ  D:  07/28/2008  T:  07/29/2008  Job:  [id number]   cc:   Arturo Morton. Riley Kill, MD, [facility]  Donnel Saxon

## 2010-09-27 NOTE — Cardiovascular Report (Signed)
[name], [name]               ACCOUNT NO.:  [id number]   MEDICAL RECORD NO.:  [id number]          PATIENT TYPE:  INP   LOCATION:  2919                         FACILITY:  [facility]   PHYSICIAN:  Bevelyn Buckles. Bensimhon, MDDATE OF BIRTH:  Nov 03, 1960   DATE OF PROCEDURE:  07/20/2008  DATE OF DISCHARGE:                            CARDIAC CATHETERIZATION   PATIENT IDENTIFICATION:  Emily Mays is a very pleasant 50 year old  woman with a family history of premature coronary artery disease.  She  has a personal history of hypertension, obesity, and ongoing tobacco  use.  She was admitted with chest pain and burning.  She eventually  ruled in for non-ST elevation myocardial infarction with positive  troponins.  EKG was notable for anterolateral T-wave inversions.  She  had an echocardiogram which preliminarily showed inferior septal  hypokinesis.  She was thus brought to the catheterization lab for  diagnostic angiography.   PROCEDURES PERFORMED:  1. Left heart cath.  2. Left ventriculogram.  3. Selective coronary angiography.  4. Abdominal aortogram to evaluate the renal arteries in the setting      of significant hypertension.  5. StarClose common femoral artery closure.   DESCRIPTION OF PROCEDURE:  The risks and indication were explained.  Consent was signed and placed on the chart.  A 5-French arterial sheath  was placed in the right femoral artery using a modified Seldinger  technique.  Standard catheters including JL-4, JR-4, and angled pigtail  were used for procedure.  All catheter exchanges made over wire.  There  were no apparent complications.   Central aortic pressure was 136/89.  Mean of 109.  LV pressure was  137/10 with an EDP of 14.  There was no aortic stenosis.   Left main was normal.   LAD was a large vessel wrapping the apex, gave off two diagonals, was  angiographically normal.   Left circumflex was a very large dominant system.  It gave off a large  OM-1, a  small posterolateral, and a large PDA, was angiographically  normal.   Right coronary artery was a small nondominant vessel.   Left ventriculogram done in the RAO position showed an EF of 45-50% with  mild-to-moderate apical hypokinesis.   Abdominal aortogram showed mild abdominal aortic plaquing.  The renals  were patent bilaterally.  There was no aneurysmal dilatation.   ASSESSMENT:  1. Normal coronaries.  2. Mild left ventricular dysfunction with apical hypokinesis.  3. Mild aortic plaquing with no evidence of abdominal aortic aneurysm.      The renal arteries are patent.   PLAN/DISCUSSION:  I suspect she probably has a mild Takotsubo variant  (stress-induced cardiomyopathy).  We will treat her with ACE inhibitors  and beta-blockers.  I would expect full recovery in several weeks.  She  will need good control of her blood pressure and also to stop smoking.      Bevelyn Buckles. Bensimhon, MD  Electronically Signed     DRB/MEDQ  D:  07/20/2008  T:  07/21/2008  Job:  [id number]

## 2010-09-27 NOTE — Discharge Summary (Signed)
NAMESHEVAWN, LANGENBERG               ACCOUNT NO.:  [id number]   MEDICAL RECORD NO.:  [id number]          PATIENT TYPE:  INP   LOCATION:  3706                         FACILITY:  [facility]   PHYSICIAN:  Veverly Fells. Excell Seltzer, MD  DATE OF BIRTH:  [birth year]   DATE OF ADMISSION:  07/24/2008  DATE OF DISCHARGE:  07/31/2008                               DISCHARGE SUMMARY   PRIMARY CARDIOLOGIST:  Veverly Fells. Excell Seltzer, MD   PRIMARY CARE PHYSICIAN:  Dr. Ardelle Park in [place].   DISCHARGE DIAGNOSIS:  Acute anterior ST-segment elevation myocardial  infarction.   SECONDARY DIAGNOSES:  1. 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  2. Tobacco abuse.  3. Ileus.  4. History of gastroesophageal reflux disease.  5. History of hypertension, although she has been hypotensive during      this admission.  6. Hypokalemia requiring supplementation.  7. Ischemic cardiomyopathy with ejection fraction of 40-50%.  8. Persistent sinus tachycardia.  9. Normocytic anemia.   ALLERGIES:  PENICILLIN.   PROCEDURES:  Left heart cardiac catheterization revealing dissection and  occlusion of the LAD with placement of drug-eluting stent in the LAD and  bare metal stent in the left circumflex.  Intravascular ultrasound.  Reload cardiac catheterization revealing stable coronary anatomy  performed July 28, 2008.  CT angiography of the chest revealing no  evidence of pulmonary embolism.  A 2-D echocardiogram performed July 25, 2008, showing an EF 40-50% with hypokinesis of the entire periapical  wall as well as the mid distal anterior wall.   HISTORY OF PRESENT ILLNESS:  A 50 year old Caucasian female with the  above problem list.  She was recently admitted to [facility] July 20, 2008, with complaints of chest pain.  She had an elevation of  troponin  0.4, as well as abnormal EKG with anterolateral T-wave inversions and  wall motion abnormality noted on her echocardiogram, prompting cardiac  catheterization.  Catheterization showed essentially normal coronary  arteries with apical wall motion abnormalities thought to be consistent  with varying tako tsubo cardiomyopathy.  The patient was initiated on  beta-blocker, ACE inhibitor therapy and subsequently discharged home.  Unfortunately, she had recurrent chest pain on March 12 that awoke her  from a nap.  EKG performed by EMS showed anterior ST-segment elevation  and code STEMI was activated and the patient was taken to [facility] for  further evaluation.   HOSPITAL COURSE:  The patient underwent emergent cardiac catheterization  revealing an occluded mid LAD which was felt to be secondary to  spontaneous dissection.  She then underwent successful PCI and stenting  of the proximal LAD with placement of an Endeavor drug-eluting stent.  As there was wall hemorrhage of the LAD which was displaced into the  left circumflex with balloon angioplasty, the left circumflex was also  successfully stented with 4.0 x 16-mm Liberte bare metal stent.  The  patient tolerated the procedure well and was placed on aspirin and  Plavix (150 mg daily) daily.  Eventually peaking her CK at 1950 MB at  244.3 and troponin I 15.73.  She was kept on Bivalirudin infusion  overnight and then initiated on low-dose beta-blocker, ACE inhibitor as  well as digoxin.  Follow-up echocardiogram performed March 13 showed a  reduction in EF from previous measure, down to 40-50%.   Ms. Grieves complained of abdominal discomfort on March 14 for which a  KUB was performed and showed findings consistent with ileus.  The  patient was made n.p.o. and NG tube was placed.  She had improvement in  symptoms and by the March 15 repeat KUB showed resolving pattern.  The  patient was tolerating p.o.'s.   In the setting of  ischemic cardiomyopathy, Ms. Wester has remained  euvolemic throughout her admission.  As above, we did initiate beta-  blocker, ACE inhibitor and digoxin therapy.  However, due to  hypotension, we had to switch her from Carvedilol to metoprolol and  subsequently.  She also reported cough with ACE inhibitor therapy, and  therefore we switched her to ARB.  However, due to persistent  hypertension, this has been discontinued.  She has been tolerating  metoprolol 25 mg b.i.d.   We opted to perform reload cardiac catheterization on March 16 to  reevaluate the dissected LAD along with newly placed stents.  She had  good flow with no restenosis in both stented areas.   Ms. Frayre has had tachycardia throughout this admission with rates in  the low 100s to 110s.  This despite beta blockade and digoxin therapy.  We checked a D-dimer which was elevated at 1.76 and for this reason we  obtained a CT angiography of the chest which showed no evidence of  pulmonary thromboembolism.   Ms. Heward has been seen by cardiac rehab and has been ambulating  without recurrent symptoms or limitations.  She had been counseled on  the importance of smoking cessation and says that she has quit.  We will  plan to discharge her home today in good condition.  We will arrange for  follow-up with Wende Bushy, [facility], Dr. Earmon Phoenix PA, on April 7 at 11:15  a.m.Marland Kitchen  She will subsequently followup with Dr. Excell Seltzer in June.   DISCHARGE LABORATORIES:  Hemoglobin 11.0, hematocrit 31.3, WBC 10.8,  platelets 468.  Sodium 137, potassium 3.9, chloride 107, CO2 25, BUN 8,  creatinine 0.1, glucose 92, total bilirubin 0.7, alkaline phosphatase  56, AST 151, ALT 60, total protein 7.0, albumin 3.1, calcium 9.2,  magnesium 2.2, amylase 97, hemoglobin A1c 5.7, lipase 36, CK 1129, MB  92.9, troponin I 15.73, total cholesterol 121, triglycerides 70, HDL 30,  LDL 95.  TSH 2.032.  Homocysteine 9.5.  Urinalysis negative.  Blood  cultures  negative x2.  Fecal occult blood negative.  Urine culture  insignificant growth.   DISPOSITION:  The patient is being discharged home today in good  condition.   FOLLOW-UP APPOINTMENTS:  As above.  The patient follow up with Wende Bushy, PA on April 7 11:15 a.m.  She is subsequently scheduled to  followup with Dr. Excell Seltzer on June 30 at 9:00 a.m. or sooner if necessary.  We have asked her to followup with Dr. Ardelle Park in [place] as previously  scheduled.   DISCHARGE MEDICATIONS:  1. Aspirin 325 mg daily.  2. Plavix 75 mg daily.  3. Lopressor 25 mg b.i.d.  4. Crestor 40 mg  daily.  5. Digoxin 0.25 mg daily.  6. Peptic 20 mg b.i.d.  7. Nitroglycerin 0.4 mg sublingual p.r.n. chest pain.  8. Alprazolam 0.5 mg b.i.d. p.r.n.   OUTSTANDING LABORATORY STUDIES:  The patient will acquire follow-up with  LFTs in approximately 6 weeks as she has been initiated on statin  therapy.  We have asked her to follow her blood pressures at home and to  bring a record of her daily blood pressures on office follow-up to  determine the possibility of reinitiating ARB as an outpatient.  Duration discharge encounter 65 minutes including physician time.      Nicolasa Ducking, ANP      Veverly Fells. Excell Seltzer, MD  Electronically Signed    CB/MEDQ  D:  07/31/2008  T:  07/31/2008  Job:  [id number]   cc:   Donnel Saxon

## 2010-09-27 NOTE — Consult Note (Signed)
NAMEMEHER, KUCINSKI NO.:  [id number]   MEDICAL RECORD NO.:  [id number]          PATIENT TYPE:  INP   LOCATION:  5524                         FACILITY:  [facility]   PHYSICIAN:  Christell Faith, MD   DATE OF BIRTH:  21-Oct-1960   DATE OF CONSULTATION:  07/20/2008  DATE OF DISCHARGE:                                 CONSULTATION   The patient is unassigned per Cardiology and consulting physician will  be [facility].   PRIMARY CARE PHYSICIAN:  Dr. Ardelle Park in [place].   CHIEF COMPLAINT:  Chest pain and presyncope.   HISTORY OF PRESENT ILLNESS:  This is a 50 year old white female who was  at rest tonight and developed severe lower sternal pain, very intense,  radiated up to the neck, lasted approximately 30 minutes in duration.  She described the pain as burning.  She also became very lightheaded,  almost passed out and then very diaphoretic, which sounds like a  profound vagal event.  She is currently symptom free.  She has never  really had chest pain and presyncope like this before.  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  She noticed nausea throughout the day.   PAST MEDICAL HISTORY:  1. GERD.  2. Bilateral tubal ligation.   SOCIAL HISTORY:  She lives in [place] with her roommate.  She is a  Location manager.  She smokes 1/2-1 pack of cigarettes a day.  Occasional alcohol.  No drugs.   FAMILY HISTORY:  Mother is alive and has had angioplasty and  arrhythmias.  No coronary artery disease in siblings or father.   REVIEW OF SYSTEMS:  Positive for GERD and anxiety.  Otherwise, 14  systems reviewed and are negative.   CODE STATUS:  Full code.   ALLERGIES:  PENICILLIN.   MEDICATIONS:  1. Prevacid 30 mg daily.  2. Xanax p.r.n.   PHYSICAL EXAMINATION:  VITAL SIGNS:  Temperature 97.5, pulse 103,  respiratory rate 18, blood pressure 150/94, and saturation 100% on room  air.  GENERAL:  This a  very pleasant, young white female in no distress.  Pupils are round and reactive.  Sclerae are clear.  Mucous membranes are  moist.  No oral lesions.  NECK:  Supple.  Neck veins are flat.  No carotid bruits.  No  thyromegaly.  No cervical adenopathy.  PULMONARY:  Lungs are clear to auscultation bilaterally without wheezing  or rales.  CARDIAC:  Tachycardiac rate, regular rhythm.  No murmurs.  ABDOMEN:  Obese, soft, nontender, and nondistended.  No bruits.  Normal  bowel sounds.  EXTREMITIES:  No edema.  2+ radial pulses bilaterally.  No clubbing or  cyanosis.  SKIN:  No rash.  MUSCULOSKELETAL:  No acute joint effusions or deformities.  NEUROLOGIC:  Awake, alert, and oriented x3.  5/5 strength in all 4  extremities.  Facial expressions are strong and symmetric.   DIAGNOSTIC TESTS:  Chest x-ray shows no acute thoracic findings.  EKG,  sinus tachycardia 102 beats per minute with anterior and inferior T-wave  inversions,  concerning for ischemia, especially without prior EKG to  compare to.   LABORATORY DATA:  White blood cell 11.5, hemoglobin 12.8, and platelets  327.  Sodium 137, potassium 3.7, BUN 12, creatinine 0.8, and glucose 99.  D-dimer negative.  CK-MB 2.8.  Troponin less than 0.05.   IMPRESSION:  A 50 year old white female with cardiac risk factors of  family history, tobacco use, and probable hypertension, but no known  coronary artery disease who had an episode of atypical chest pain today.  She is now pain free, but has a concerning EKG.   PLAN:  1. Agree with the plan to rule out myocardial infarction and monitor      on telemetry.  2. Initiate aspirin as you are and in addition would anticoagulate the      patient with therapeutic Lovenox until the rule out is complete.  3. There is no obvious cause of her tachycardia such as dehydration or      hyperthyroidism, then would proceed with escalating doses of beta-      blocker to get her heart rate under control.  4.  Check fasting lipid panel and initiate statin therapy if needed.  5. She probably needs long-term therapy for hypertension.  We will      defer to admitting team on this issue.  6. Please keep the patient n.p.o. for a stress test versus      catheterization pending the results of her rule out.      Christell Faith, MD  Electronically Signed     NDL/MEDQ  D:  07/20/2008  T:  07/20/2008  Job:  [id number]

## 2010-10-06 ENCOUNTER — Encounter: Payer: Managed Care, Other (non HMO) | Admitting: Internal Medicine

## 2010-10-13 ENCOUNTER — Other Ambulatory Visit: Payer: Self-pay | Admitting: Cardiovascular Disease

## 2010-10-14 ENCOUNTER — Ambulatory Visit: Payer: Managed Care, Other (non HMO) | Admitting: Cardiovascular Disease

## 2010-10-17 ENCOUNTER — Encounter: Payer: Self-pay | Admitting: Cardiovascular Disease

## 2010-10-18 ENCOUNTER — Encounter: Payer: Self-pay | Admitting: Internal Medicine

## 2010-11-07 ENCOUNTER — Ambulatory Visit (INDEPENDENT_AMBULATORY_CARE_PROVIDER_SITE_OTHER): Payer: Managed Care, Other (non HMO) | Admitting: Cardiovascular Disease

## 2010-11-07 ENCOUNTER — Encounter: Payer: Self-pay | Admitting: Cardiovascular Disease

## 2010-11-07 VITALS — BP 100/70 | HR 68 | Resp 18 | Ht 64.0 in | Wt 172.1 lb

## 2010-11-07 DIAGNOSIS — I2589 Other forms of chronic ischemic heart disease: Secondary | ICD-10-CM

## 2010-11-07 DIAGNOSIS — I2510 Atherosclerotic heart disease of native coronary artery without angina pectoris: Secondary | ICD-10-CM

## 2010-11-07 DIAGNOSIS — I252 Old myocardial infarction: Secondary | ICD-10-CM

## 2010-11-07 MED ORDER — NITROGLYCERIN 0.4 MG SL SUBL: 0.4000 mg | SUBLINGUAL_TABLET | SUBLINGUAL | Status: DC | PRN

## 2010-11-07 NOTE — Progress Notes (Signed)
HPI:  50 year-old woman with hx anterior MI March 2010 secondary to spontaneous LAD dissection. She also required stenting of the LCx at that time. Post MI LVEF is in range of 30-35% and she underwent ICD implant January 2011. She presents today for followup evaluation.  The patient was evaluated in the Emergency Dept last month for chest pain. She had an episode of chest burning, with radiation into the neck and jaw, associated with an acidic taste and vomiting. The episode was prolonged and felt very similar to acid reflux symptoms she has had in the past. This was unlike her cardiac pain. She denies dyspnea, edema, orthopnea, or PND. Otherwise has been in her normal state of health and denies exertional chest pain or pressure.  Outpatient Encounter Prescriptions as of 11/07/2010  Medication Sig Dispense Refill  . ALPRAZolam (XANAX) 0.5 MG tablet Take 0.5 mg by mouth at bedtime as needed.        Marland Kitchen aspirin 81 MG tablet Take 81 mg by mouth daily.        . carvedilol (COREG) 25 MG tablet Take 25 mg by mouth 2 (two) times daily.        . digoxin (LANOXIN) 0.25 MG tablet Take 250 mcg by mouth daily.        . furosemide (LASIX) 40 MG tablet Take 40 mg by mouth daily.        Marland Kitchen glipiZIDE (GLUCOTROL) 10 MG tablet Take 10 mg by mouth daily.        . nitroGLYCERIN (NITROSTAT) 0.4 MG SL tablet Place 1 tablet (0.4 mg total) under the tongue every 5 (five) minutes as needed.  90 tablet  3  . oxyCODONE-acetaminophen (PERCOCET) 5-325 MG per tablet Take 1 tablet by mouth every 4 (four) hours as needed.        Marland Kitchen PLAVIX 75 MG tablet TAKE 1 TABLET BY MOUTH DAILY  30 tablet  5  . potassium chloride (KLOR-CON) 10 MEQ CR tablet Take 10 mEq by mouth daily.        . rosuvastatin (CRESTOR) 20 MG tablet Take 20 mg by mouth daily.        . sitaGLIPtan-metformin (JANUMET) 50-1000 MG per tablet Take 1 tablet by mouth daily.        . valsartan (DIOVAN) 40 MG tablet Take 40 mg by mouth daily.        Marland Kitchen DISCONTD: nitroGLYCERIN  (NITROSTAT) 0.4 MG SL tablet Place 0.4 mg under the tongue every 5 (five) minutes as needed.          Allergies  Allergen Reactions  . Penicillins     Past Medical History  Diagnosis Date  . Anemia   . Other specified forms of chronic ischemic heart disease     echo 07/26/2008 EfF 30% with perapical HK  . Hypokalemia   . Unspecified essential hypertension   . Esophageal reflux   . Paralytic ileus   . CAD (coronary artery disease)     spontaneous LAD dissection presenting with anterior STEMI. s/p MI 07/24/08 with PCI Endeavor DES to Prox. LAD and BMA to prox. LCX.. relook cath 07/28/08 showing cont. patency of LAD to LCX.  Marland Kitchen Chronic systolic HF (heart failure)   . DDD (degenerative disc disease)   . GERD (gastroesophageal reflux disease)     ROS: Negative except as per HPI  BP 100/70  Pulse 68  Resp 18  Ht 5\' 4"  (1.626 m)  Wt 172 lb 1.9 oz (78.073 kg)  BMI  29.54 kg/m2  PHYSICAL EXAM: Pt is alert and oriented, NAD HEENT: normal Neck: JVP - normal, carotids 2+= without bruits Lungs: CTA bilaterally CV: RRR without murmur or gallop Abd: soft, NT, Positive BS, no hepatomegaly Ext: no C/C/E, distal pulses intact and equal Skin: warm/dry no rash  EKG:  Reviewed from Sep 17, 2010, normal sinus rhythm with age-indeterminate anteroseptal infarct, otherwise within normal limits.  ASSESSMENT AND PLAN:

## 2010-11-07 NOTE — Assessment & Plan Note (Signed)
The patient remains on carvedilol, digoxin, and valsartan. She is status post ICD implantation. No evidence of volume overload on exam. Symptoms remain New York Heart Association class II.

## 2010-11-07 NOTE — Assessment & Plan Note (Signed)
The patient is stable without angina. Her symptoms prompting emergency room evaluation were consistent with gastroesophageal reflux. The patient will continue on her current medical program.

## 2010-11-07 NOTE — Patient Instructions (Signed)
Your physician wants you to follow-up in: 6 months with Dr. Cooper.  You will receive a reminder letter in the mail two months in advance. If you don't receive a letter, please call our office to schedule the follow-up appointment.   

## 2010-11-09 ENCOUNTER — Ambulatory Visit (INDEPENDENT_AMBULATORY_CARE_PROVIDER_SITE_OTHER): Payer: Managed Care, Other (non HMO) | Admitting: Internal Medicine

## 2010-11-09 ENCOUNTER — Encounter: Payer: Self-pay | Admitting: Internal Medicine

## 2010-11-09 DIAGNOSIS — I10 Essential (primary) hypertension: Secondary | ICD-10-CM

## 2010-11-09 DIAGNOSIS — I428 Other cardiomyopathies: Secondary | ICD-10-CM

## 2010-11-09 DIAGNOSIS — I2589 Other forms of chronic ischemic heart disease: Secondary | ICD-10-CM

## 2010-11-09 NOTE — Patient Instructions (Signed)
Your physician recommends that you schedule a follow-up appointment in: 3 months with device clinic  

## 2010-11-09 NOTE — Progress Notes (Signed)
The patient presents today for routine electrophysiology followup.  Since last being seen in our clinic, the patient reports doing very well.  Today, she denies symptoms of palpitations, chest pain, shortness of breath, orthopnea, PND, lower extremity edema, dizziness, presyncope, syncope, or neurologic sequela.  The patient feels that she is tolerating medications without difficulties and is otherwise without complaint today.   Past Medical History  Diagnosis Date  . Anemia   . Other specified forms of chronic ischemic heart disease     echo 07/26/2008 EfF 30% with perapical HK  . Hypokalemia   . Unspecified essential hypertension   . Esophageal reflux   . Paralytic ileus   . CAD (coronary artery disease)     spontaneous LAD dissection presenting with anterior STEMI. s/p MI 07/24/08 with PCI Endeavor DES to Prox. LAD and BMA to prox. LCX.. relook cath 07/28/08 showing cont. patency of LAD to LCX.  Marland Kitchen Chronic systolic HF (heart failure)   . DDD (degenerative disc disease)   . GERD (gastroesophageal reflux disease)    Past Surgical History  Procedure Date  . Cholecystectomy     27 years ago  . Cardiac catheterization March 2010    2 cardiac stent placement in March 2010.   . Cardiac defibrillator placement december 2010    [employer] Jude  . Abdominal hysterectomy   . Bilateral salpingoophorectomy   . Total abdominal hysterectomy w/ bilateral salpingoophorectomy   . Left pelvic lymphadenectomy     by Dr. De Blanch     Current Outpatient Prescriptions  Medication Sig Dispense Refill  . ALPRAZolam (XANAX) 0.5 MG tablet Take 0.5 mg by mouth at bedtime as needed.        Marland Kitchen aspirin 81 MG tablet Take 81 mg by mouth daily.        . carvedilol (COREG) 25 MG tablet Take 25 mg by mouth 2 (two) times daily.        . digoxin (LANOXIN) 0.25 MG tablet Take 250 mcg by mouth daily.        . furosemide (LASIX) 40 MG tablet Take 40 mg by mouth daily.        Marland Kitchen glipiZIDE (GLUCOTROL) 10 MG tablet  Take 10 mg by mouth daily.        . nitroGLYCERIN (NITROSTAT) 0.4 MG SL tablet Place 1 tablet (0.4 mg total) under the tongue every 5 (five) minutes as needed.  90 tablet  3  . oxyCODONE-acetaminophen (PERCOCET) 5-325 MG per tablet Take 1 tablet by mouth every 4 (four) hours as needed.        Marland Kitchen PLAVIX 75 MG tablet TAKE 1 TABLET BY MOUTH DAILY  30 tablet  5  . potassium chloride (KLOR-CON) 10 MEQ CR tablet Take 10 mEq by mouth daily.        . rosuvastatin (CRESTOR) 20 MG tablet Take 20 mg by mouth daily.        . sitaGLIPtan-metformin (JANUMET) 50-1000 MG per tablet Take 1 tablet by mouth daily.        . valsartan (DIOVAN) 40 MG tablet Take 40 mg by mouth daily.          Allergies  Allergen Reactions  . Penicillins     History   Social History  . Marital Status: Single    Spouse Name: N/A    Number of Children: N/A  . Years of Education: N/A   Occupational History  . 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  Smoking status: Former Smoker -- 0.5 packs/day  . Smokeless tobacco: Not on file   Comment: used to smoke 1/2 ppd but has not smoked since discharge.   . Alcohol Use: Yes     Occasional   . Drug Use: Not on file  . Sexually Active: Not on file   Other Topics Concern  . 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 Lives in [place] with a roommate.    Physical Exam: Filed Vitals:   11/09/10 0822  BP: 107/68  Pulse: 81  Resp: 14  Height: 5\' 4"  (1.626 m)  Weight: 173 lb (78.472 kg)    GEN- The patient is well appearing, alert and oriented x 3 today.   Head- normocephalic, atraumatic Eyes-  Sclera clear, conjunctiva pink Ears- hearing intact Oropharynx- clear Neck- supple, no JVP Lymph- no cervical lymphadenopathy Lungs- Clear to ausculation bilaterally, normal work of breathing Heart- Regular rate and rhythm, no murmurs, rubs or gallops, PMI not laterally displaced GI- soft, NT, ND, +  BS ICD pocket well healed Extremities- no clubbing, cyanosis, or edema MS- no significant deformity or atrophy Skin- no rash or lesion Psych- euthymic mood, full affect Neuro- strength and sensation are intact  Assessment and Plan:

## 2010-11-09 NOTE — Assessment & Plan Note (Signed)
Normal ICD function See Pace Art report No changes today  

## 2010-11-09 NOTE — Assessment & Plan Note (Signed)
Stable No change required today  

## 2010-11-10 ENCOUNTER — Other Ambulatory Visit: Payer: Self-pay | Admitting: *Deleted

## 2010-11-10 MED ORDER — VALSARTAN 40 MG PO TABS: 40.0000 mg | ORAL_TABLET | Freq: Every day | ORAL | Status: DC

## 2011-01-03 ENCOUNTER — Other Ambulatory Visit: Payer: Self-pay | Admitting: Cardiovascular Disease

## 2011-02-08 ENCOUNTER — Encounter: Payer: Self-pay | Admitting: Internal Medicine

## 2011-02-08 ENCOUNTER — Ambulatory Visit (INDEPENDENT_AMBULATORY_CARE_PROVIDER_SITE_OTHER): Payer: Managed Care, Other (non HMO) | Admitting: *Deleted

## 2011-02-08 DIAGNOSIS — I2589 Other forms of chronic ischemic heart disease: Secondary | ICD-10-CM

## 2011-02-08 DIAGNOSIS — I498 Other specified cardiac arrhythmias: Secondary | ICD-10-CM

## 2011-02-08 LAB — ICD DEVICE OBSERVATION
BRDY-0005RV: 30 {beats}/min
DEV-0020ICD: NEGATIVE
DEVICE MODEL ICD: 753204
FVT: 0
RV LEAD AMPLITUDE: 12 mv
RV LEAD IMPEDENCE ICD: 575 Ohm
RV LEAD THRESHOLD: 1 V
TOT-0007: 3
TOT-0008: 0
TOT-0009: 1
TZON-0003SLOWVT: 350 ms
TZON-0004SLOWVT: 24
TZON-0005SLOWVT: 6
TZON-0010SLOWVT: 80 ms

## 2011-02-11 ENCOUNTER — Other Ambulatory Visit: Payer: Self-pay | Admitting: Cardiovascular Disease

## 2011-02-13 ENCOUNTER — Encounter: Payer: Self-pay | Admitting: *Deleted

## 2011-04-08 ENCOUNTER — Other Ambulatory Visit: Payer: Self-pay | Admitting: Cardiovascular Disease

## 2011-04-13 ENCOUNTER — Ambulatory Visit: Payer: Managed Care, Other (non HMO) | Admitting: Gynecologic Oncology

## 2011-05-10 ENCOUNTER — Ambulatory Visit (INDEPENDENT_AMBULATORY_CARE_PROVIDER_SITE_OTHER): Payer: Managed Care, Other (non HMO) | Admitting: Cardiovascular Disease

## 2011-05-10 ENCOUNTER — Encounter: Payer: Self-pay | Admitting: Cardiovascular Disease

## 2011-05-10 DIAGNOSIS — I10 Essential (primary) hypertension: Secondary | ICD-10-CM

## 2011-05-10 DIAGNOSIS — I252 Old myocardial infarction: Secondary | ICD-10-CM

## 2011-05-10 DIAGNOSIS — E785 Hyperlipidemia, unspecified: Secondary | ICD-10-CM

## 2011-05-10 DIAGNOSIS — I2510 Atherosclerotic heart disease of native coronary artery without angina pectoris: Secondary | ICD-10-CM

## 2011-05-10 MED ORDER — NITROGLYCERIN 0.4 MG SL SUBL: 0.4000 mg | SUBLINGUAL_TABLET | SUBLINGUAL | Status: DC | PRN

## 2011-05-10 NOTE — Assessment & Plan Note (Signed)
Blood pressure is well controlled on current medical program. No changes were made today.

## 2011-05-10 NOTE — Patient Instructions (Signed)
Your physician wants you to follow-up in: 6 MONTHS.  You will receive a reminder letter in the mail two months in advance. If you don't receive a letter, please call our office to schedule the follow-up appointment.  Your physician recommends that you continue on your current medications as directed. Please refer to the Current Medication list given to you today.  

## 2011-05-10 NOTE — Progress Notes (Signed)
HPI:  A 50 year old woman presenting for followup evaluation. 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 She had complex stenting of the LAD and left circumflex. Her left ventricular ejection fraction is less than 35% and she underwent ICD implantation in 2011. She has had no subsequent ischemic event since her initial presentation in 2010.  She's had no ICD discharges.  She had a recent pain in her left shoulder but she does not think he was heart related. He complains of dyspnea with exertion, but this is unchanged from her baseline. She denies exertional chest pain or pressure. She denies edema, palpitations, lightheadedness, or syncope. She has had no ICD discharges.  Outpatient Encounter Prescriptions as of 05/10/2011  Medication Sig Dispense Refill  . ALPRAZolam (XANAX) 0.5 MG tablet Take 0.5 mg by mouth at bedtime as needed.        Marland Kitchen aspirin 81 MG tablet Take 81 mg by mouth daily.        . carvedilol (COREG) 25 MG tablet TAKE 1 TABLET TWICE A DAY  60 tablet  8  . clopidogrel (PLAVIX) 75 MG tablet Take 75 mg by mouth daily.        . digoxin (LANOXIN) 0.25 MG tablet TAKE 1 TABLET EVERY DAY  30 tablet  5  . furosemide (LASIX) 40 MG tablet Take 40 mg by mouth daily.        Marland Kitchen glipiZIDE (GLUCOTROL) 10 MG tablet Take 10 mg by mouth 2 (two) times daily before a meal.       . nitroGLYCERIN (NITROSTAT) 0.4 MG SL tablet Place 1 tablet (0.4 mg total) under the tongue every 5 (five) minutes as needed.  90 tablet  3  . oxyCODONE-acetaminophen (PERCOCET) 5-325 MG per tablet Take 1 tablet by mouth every 4 (four) hours as needed.        . pantoprazole (PROTONIX) 40 MG tablet Take 40 mg by mouth daily.        . potassium chloride (KLOR-CON) 10 MEQ CR tablet Take 20 mEq by mouth daily.       . simvastatin (ZOCOR) 40 MG tablet Take 40 mg by mouth.       . sitaGLIPtan-metformin (JANUMET) 50-1000 MG per tablet Take 1 tablet by mouth 2 (two) times daily with a meal.       .  valsartan (DIOVAN) 40 MG tablet Take 1 tablet (40 mg total) by mouth daily.  30 tablet  6  . VITAMIN D, CHOLECALCIFEROL, PO Take 5,000 Units by mouth daily.        Marland Kitchen DISCONTD: carvedilol (COREG) 25 MG tablet Take 25 mg by mouth 2 (two) times daily with a meal.        . DISCONTD: rosuvastatin (CRESTOR) 20 MG tablet Take 5 mg by mouth daily.       Marland Kitchen DISCONTD: clopidogrel (PLAVIX) 75 MG tablet TAKE 1 TABLET BY MOUTH DAILY  30 tablet  5    Allergies  Allergen Reactions  . Penicillins     Past Medical History  Diagnosis Date  . Anemia   . Other specified forms of chronic ischemic heart disease     echo 07/26/2008 EfF 30% with perapical HK  . Hypokalemia   . Unspecified essential hypertension   . Esophageal reflux   . Paralytic ileus   . CAD (coronary artery disease)     spontaneous LAD dissection presenting with anterior STEMI. s/p MI 07/24/08 with PCI Endeavor DES to Prox. LAD and  BMA to prox. LCX.. relook cath 07/28/08 showing cont. patency of LAD to LCX.  Marland Kitchen Chronic systolic HF (heart failure)   . DDD (degenerative disc disease)   . GERD (gastroesophageal reflux disease)     ROS: Negative except as per HPI  BP 120/68  Pulse 83  Ht 5\' 5"  (1.651 m)  Wt 78.926 kg (174 lb)  BMI 28.96 kg/m2  PHYSICAL EXAM: Pt is alert and oriented, NAD HEENT: normal Neck: JVP - normal, carotids 2+= without bruits Lungs: CTA bilaterally CV: RRR without murmur or gallop Abd: soft, NT, Positive BS, no hepatomegaly Ext: no C/C/E, distal pulses intact and equal Skin: warm/dry no rash  EKG:  Normal sinus rhythm 83 beats per minute, low voltage QRS, age undetermined septal infarction  ASSESSMENT AND PLAN:

## 2011-05-10 NOTE — Assessment & Plan Note (Signed)
Lipids have been excellent. She is on simvastatin. She is managed by her primary care physician.

## 2011-05-10 NOTE — Assessment & Plan Note (Signed)
The patient is stable without angina. We will continue her current medical program without changes. Her meds were reviewed and she is on appropriate secondary risk reduction medications. We discussed the importance of initiating an exercise program which I suspect will help with her exertional dyspnea.

## 2011-05-11 ENCOUNTER — Other Ambulatory Visit: Payer: Self-pay | Admitting: Internal Medicine

## 2011-05-11 ENCOUNTER — Encounter: Payer: Self-pay | Admitting: Internal Medicine

## 2011-05-11 ENCOUNTER — Ambulatory Visit (INDEPENDENT_AMBULATORY_CARE_PROVIDER_SITE_OTHER): Payer: Managed Care, Other (non HMO) | Admitting: *Deleted

## 2011-05-11 DIAGNOSIS — I428 Other cardiomyopathies: Secondary | ICD-10-CM

## 2011-05-11 LAB — REMOTE ICD DEVICE
DEV-0020ICD: NEGATIVE
HV IMPEDENCE: 66 Ohm
RV LEAD IMPEDENCE ICD: 580 Ohm
TZON-0004SLOWVT: 24
TZON-0005SLOWVT: 6

## 2011-05-17 ENCOUNTER — Encounter: Payer: Self-pay | Admitting: *Deleted

## 2011-05-18 NOTE — Progress Notes (Signed)
Remote icd check  

## 2011-05-23 ENCOUNTER — Encounter: Payer: Self-pay | Admitting: Gynecologic Oncology

## 2011-05-24 ENCOUNTER — Ambulatory Visit: Payer: Managed Care, Other (non HMO) | Attending: Gynecologic Oncology | Admitting: Gynecologic Oncology

## 2011-05-24 ENCOUNTER — Encounter: Payer: Self-pay | Admitting: Gynecologic Oncology

## 2011-05-24 VITALS — BP 100/64 | HR 60 | Temp 98.2°F | Resp 16 | Ht 64.76 in | Wt 170.0 lb

## 2011-05-24 DIAGNOSIS — K219 Gastro-esophageal reflux disease without esophagitis: Secondary | ICD-10-CM | POA: Insufficient documentation

## 2011-05-24 DIAGNOSIS — Z9079 Acquired absence of other genital organ(s): Secondary | ICD-10-CM | POA: Insufficient documentation

## 2011-05-24 DIAGNOSIS — I10 Essential (primary) hypertension: Secondary | ICD-10-CM | POA: Insufficient documentation

## 2011-05-24 DIAGNOSIS — Z802 Family history of malignant neoplasm of other respiratory and intrathoracic organs: Secondary | ICD-10-CM | POA: Insufficient documentation

## 2011-05-24 DIAGNOSIS — Z803 Family history of malignant neoplasm of breast: Secondary | ICD-10-CM | POA: Insufficient documentation

## 2011-05-24 DIAGNOSIS — Z90710 Acquired absence of both cervix and uterus: Secondary | ICD-10-CM | POA: Insufficient documentation

## 2011-05-24 DIAGNOSIS — I5022 Chronic systolic (congestive) heart failure: Secondary | ICD-10-CM | POA: Insufficient documentation

## 2011-05-24 DIAGNOSIS — Z8051 Family history of malignant neoplasm of kidney: Secondary | ICD-10-CM | POA: Insufficient documentation

## 2011-05-24 DIAGNOSIS — I2510 Atherosclerotic heart disease of native coronary artery without angina pectoris: Secondary | ICD-10-CM | POA: Insufficient documentation

## 2011-05-24 DIAGNOSIS — Z95810 Presence of automatic (implantable) cardiac defibrillator: Secondary | ICD-10-CM | POA: Insufficient documentation

## 2011-05-24 DIAGNOSIS — Z8052 Family history of malignant neoplasm of bladder: Secondary | ICD-10-CM | POA: Insufficient documentation

## 2011-05-24 DIAGNOSIS — C55 Malignant neoplasm of uterus, part unspecified: Secondary | ICD-10-CM

## 2011-05-24 DIAGNOSIS — C549 Malignant neoplasm of corpus uteri, unspecified: Secondary | ICD-10-CM | POA: Insufficient documentation

## 2011-05-24 DIAGNOSIS — Z79899 Other long term (current) drug therapy: Secondary | ICD-10-CM | POA: Insufficient documentation

## 2011-05-24 NOTE — Progress Notes (Signed)
Follow-up Note: Gyn-Onc   Emily Mays 51 y.o. female  CC:  Chief Complaint  Patient presents with  . Endometrial cancer    Follow-up    HPI: Ms. Emily Mays is a 51 year old with a grade 1 endometrial cancer, diagnosed by Dr. Ambrose Mantle.  Upon evaluation of a February 2011 Pap demonstrated an AGUS.  On August 10, 2009, she underwent a total abdominal hysterectomy and bilateral salpingo- oophorectomy.  Final pathology was notable for a grade 1 endometrial cancer with no myometrial invasion.  Pelvic lymph nodes were without evidence of metastatic disease.     Interval History: No interim vaginal bleeding. The patient has not been compliant with recommendation to seek genetic counseling.  Current Meds:  Outpatient Encounter Prescriptions as of 05/24/2011  Medication Sig Dispense Refill  . ALPRAZolam (XANAX) 0.5 MG tablet Take 0.5 mg by mouth at bedtime as needed.        Marland Kitchen aspirin 81 MG tablet Take 81 mg by mouth daily.        . clopidogrel (PLAVIX) 75 MG tablet Take 75 mg by mouth daily.        . digoxin (LANOXIN) 0.25 MG tablet TAKE 1 TABLET EVERY DAY  30 tablet  5  . furosemide (LASIX) 40 MG tablet Take 40 mg by mouth daily.        Marland Kitchen glipiZIDE (GLUCOTROL) 10 MG tablet Take 10 mg by mouth 2 (two) times daily before a meal.       . nitroGLYCERIN (NITROSTAT) 0.4 MG SL tablet Place 1 tablet (0.4 mg total) under the tongue every 5 (five) minutes as needed.  25 tablet  3  . oxyCODONE-acetaminophen (PERCOCET) 5-325 MG per tablet Take 1 tablet by mouth every 4 (four) hours as needed.        . pantoprazole (PROTONIX) 40 MG tablet Take 40 mg by mouth daily.        . potassium chloride (KLOR-CON) 10 MEQ CR tablet Take 20 mEq by mouth daily.       . simvastatin (ZOCOR) 40 MG tablet Take 40 mg by mouth.       . sitaGLIPtan-metformin (JANUMET) 50-1000 MG per tablet Take 1 tablet by mouth 2 (two) times daily with a meal.       . valsartan (DIOVAN) 40 MG tablet Take 1 tablet (40 mg total) by mouth  daily.  30 tablet  6  . VITAMIN D, CHOLECALCIFEROL, PO Take 5,000 Units by mouth daily.        . carvedilol (COREG) 25 MG tablet TAKE 1 TABLET TWICE A DAY  60 tablet  8    Allergy:  Allergies  Allergen Reactions  . Penicillins     Social Hx:   History   Social History  . Marital Status: Single    Spouse Name: N/A    Number of Children: N/A  . Years of Education: N/A   Occupational History  . 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 Smoking status: Former Smoker -- 0.5 packs/day  . Smokeless tobacco: Not on file   Comment: used to smoke 1/2 ppd but has not smoked since discharge.   . Alcohol Use: Yes     Occasional   . Drug Use: Not on file  . Sexually Active: No   Other Topics Concern  . 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 Lives in [place] with a roommate.  Past Surgical Hx:  Past Surgical History  Procedure Date  . Cholecystectomy     27 years ago  . Cardiac catheterization March 2010    2 cardiac stent placement in March 2010.   . Cardiac defibrillator placement december 2010    St. Jude  . Abdominal hysterectomy   . Bilateral salpingoophorectomy   . Total abdominal hysterectomy w/ bilateral salpingoophorectomy   . Left pelvic lymphadenectomy     by Dr. De Blanch     Past Medical Hx:  Past Medical History  Diagnosis Date  . Anemia   . Other specified forms of chronic ischemic heart disease     echo 07/26/2008 EfF 30% with perapical HK  . Hypokalemia   . Unspecified essential hypertension   . Esophageal reflux   . Paralytic ileus   . CAD (coronary artery disease)     spontaneous LAD dissection presenting with anterior STEMI. s/p MI 07/24/08 with PCI Endeavor DES to Prox. LAD and BMA to prox. LCX.. relook cath 07/28/08 showing cont. patency of LAD to LCX.  Marland Kitchen Chronic systolic HF (heart failure)   . DDD (degenerative disc disease)   . GERD  (gastroesophageal reflux disease)     Family Hx:  Family History  Problem Relation Age of Onset  . Cancer Father     Bladder cancer  . Kidney cancer Paternal Aunt   . Breast cancer Paternal Aunt   . Throat cancer Paternal Uncle     Review of Systems:  Constitutional  Feels well  Skin No rash, sores, jaundice, itching, dryness,  Cardiovascular  No chest pain, shortness of breath, or edema  Pulmonary  No cough or wheeze.  Gastro Intestinal  No nausea, vomitting, or diarrhoea. No bright red blood per rectum, no abdominal pain, change in bowel movement, or constipation. No abdominal bloating or early satiety no change in appetite Genito Urinary  No frequency, urgency, dysuria, no vaginal bleeding or discharge.  No hematuria. Musculo Skeletal  No myalgia, arthralgia, joint swelling or pain  Neurologic  No weakness, numbness, change in gait,  Psychology  No depression, anxiety, insomnia.     Vitals:  Blood pressure 100/64, pulse 60, temperature 98.2 F (36.8 C), temperature source Oral, resp. rate 16, height 5' 4.76" (1.645 m), weight 170 lb (77.111 kg).  Physical Exam: WD female in no acute distress Neck  Supple without any enlargements.  Lymph node survey. No cervical supraclavicular cervical or inguinal adenopathy Cardiovascular  Pulse normal rate, regularity and rhythm. S1 and S2 normal. Lungs  Clear to auscultation bilateraly, without wheezes/crackles/rhonchi. Good air movement.  Skin  No rash/lesions/breakdown  Psychiatry  Alert and oriented to person, place, and time  Back No CVA tenderness Abdomen  Normoactive bowel sounds, abdomen soft, non-tender and obese. Surgical  sites intact without evidence of hernia. No evidence of ascites no palpable omental cake Genito Urinary  Vulva/: Normal external female genitalia.  No lesions.   Bladder/urethra:  No lesions or masses  Vagina: Atrophic without any lesions no bladder discharge within the vault. Rectal    Good tone, no masses no cul de sac nodularity.  Extremities  No bilateral cyanosis, clubbing or edema.    Assessment/Plan:  This is a 51 y.o. year old with stage IA grade 1 endometrioid endometrial cancer. The patient's without any evidence of disease. I have asked her to follow up with Dr. Betsy Pries in 6 months at which time a Pap test will be collected. Have recommended followup with GYN oncology in 12  months. She's also been strongly advised to follow the recommendation for genetic counseling.   Laurette Schimke, MD., PhD. 05/24/2011, 2:59 PM

## 2011-05-24 NOTE — Patient Instructions (Signed)
Follow up with Dr. Betsy Pries in 6 months.  Follow up with GYN oncology in one year.  Followup with recommendation for genetic counseling

## 2011-06-07 ENCOUNTER — Other Ambulatory Visit: Payer: Self-pay | Admitting: *Deleted

## 2011-06-07 MED ORDER — VALSARTAN 40 MG PO TABS: 40.0000 mg | ORAL_TABLET | Freq: Every day | ORAL | Status: DC

## 2011-06-09 ENCOUNTER — Other Ambulatory Visit: Payer: Self-pay | Admitting: *Deleted

## 2011-06-09 MED ORDER — VALSARTAN 40 MG PO TABS: 40.0000 mg | ORAL_TABLET | Freq: Every day | ORAL | Status: DC

## 2011-06-12 ENCOUNTER — Other Ambulatory Visit: Payer: Managed Care, Other (non HMO) | Admitting: Lab

## 2011-06-12 ENCOUNTER — Telehealth: Payer: Self-pay | Admitting: Genetic Counselor

## 2011-06-21 ENCOUNTER — Telehealth: Payer: Self-pay | Admitting: Internal Medicine

## 2011-06-21 NOTE — Telephone Encounter (Signed)
New problem Pt's daughter called. Pt is working around new machine at work. It is called microwave generator. She works at [employer]. She wants to know will this effect her device. Please call back

## 2011-06-22 ENCOUNTER — Encounter: Payer: Self-pay | Admitting: *Deleted

## 2011-06-22 NOTE — Telephone Encounter (Signed)
Fu call Patient was calling back. She said she needs a note saying its ok to be around this machine.

## 2011-06-22 NOTE — Telephone Encounter (Signed)
Fu call Pt said she dropped off paperwork today and wanted to know if it has been completed

## 2011-06-23 NOTE — Telephone Encounter (Signed)
Paperwork was completed and given to pt.

## 2011-06-30 ENCOUNTER — Telehealth: Payer: Self-pay | Admitting: Genetic Counselor

## 2011-07-03 ENCOUNTER — Other Ambulatory Visit: Payer: Managed Care, Other (non HMO) | Admitting: Lab

## 2011-08-02 ENCOUNTER — Other Ambulatory Visit: Payer: Self-pay | Admitting: Cardiovascular Disease

## 2011-08-02 NOTE — Telephone Encounter (Signed)
...   Requested Prescriptions   Pending Prescriptions Disp Refills  . digoxin (LANOXIN) 0.25 MG tablet [Pharmacy Med Name: DIGOXIN 250 MCG TABLET] 30 tablet 4    Sig: TAKE 1 TABLET EVERY DAY

## 2011-08-10 ENCOUNTER — Encounter: Payer: Self-pay | Admitting: Internal Medicine

## 2011-08-10 ENCOUNTER — Ambulatory Visit (INDEPENDENT_AMBULATORY_CARE_PROVIDER_SITE_OTHER): Payer: Managed Care, Other (non HMO) | Admitting: *Deleted

## 2011-08-10 DIAGNOSIS — I498 Other specified cardiac arrhythmias: Secondary | ICD-10-CM

## 2011-08-10 DIAGNOSIS — I428 Other cardiomyopathies: Secondary | ICD-10-CM

## 2011-08-10 DIAGNOSIS — I509 Heart failure, unspecified: Secondary | ICD-10-CM

## 2011-08-14 LAB — REMOTE ICD DEVICE
DEV-0020ICD: NEGATIVE
HV IMPEDENCE: 74 Ohm
RV LEAD IMPEDENCE ICD: 540 Ohm
TZON-0003SLOWVT: 350 ms
TZON-0005SLOWVT: 6

## 2011-08-16 NOTE — Progress Notes (Signed)
Remote icd check w/icm  

## 2011-08-31 ENCOUNTER — Encounter: Payer: Self-pay | Admitting: *Deleted

## 2011-10-03 ENCOUNTER — Other Ambulatory Visit: Payer: Self-pay | Admitting: Cardiovascular Disease

## 2011-10-12 ENCOUNTER — Other Ambulatory Visit: Payer: Self-pay | Admitting: Cardiovascular Disease

## 2011-11-27 ENCOUNTER — Encounter: Payer: Self-pay | Admitting: Internal Medicine

## 2011-11-27 ENCOUNTER — Ambulatory Visit (INDEPENDENT_AMBULATORY_CARE_PROVIDER_SITE_OTHER): Payer: Managed Care, Other (non HMO) | Admitting: Internal Medicine

## 2011-11-27 VITALS — BP 130/70 | HR 84 | Resp 18 | Ht 65.0 in | Wt 170.4 lb

## 2011-11-27 DIAGNOSIS — I2589 Other forms of chronic ischemic heart disease: Secondary | ICD-10-CM

## 2011-11-27 DIAGNOSIS — I498 Other specified cardiac arrhythmias: Secondary | ICD-10-CM

## 2011-11-27 DIAGNOSIS — R609 Edema, unspecified: Secondary | ICD-10-CM

## 2011-11-27 DIAGNOSIS — I10 Essential (primary) hypertension: Secondary | ICD-10-CM

## 2011-11-27 LAB — ICD DEVICE OBSERVATION
BRDY-0002RV: 40 {beats}/min
BRDY-0005RV: 30 {beats}/min
CHARGE TIME: 9.4 s
FVT: 0
RV LEAD AMPLITUDE: 12 mv
RV LEAD IMPEDENCE ICD: 512.5 Ohm
RV LEAD THRESHOLD: 0.875 V
TOT-0008: 0
TOT-0010: 8
TZON-0003SLOWVT: 350 ms
TZON-0004SLOWVT: 24
TZON-0010SLOWVT: 80 ms
VF: 0

## 2011-11-27 NOTE — Assessment & Plan Note (Signed)
Stable No change required today  

## 2011-11-27 NOTE — Progress Notes (Signed)
PCP: Galvin Proffer, MD Primary Cardiologist:  Dr Excell Seltzer  The patient presents today for routine electrophysiology followup.  Since last being seen in our clinic, the patient reports doing very well.  Today, she denies symptoms of palpitations, chest pain, shortness of breath, orthopnea, PND, lower extremity edema, dizziness, presyncope, syncope, or neurologic sequela.  The patient feels that she is tolerating medications without difficulties and is otherwise without complaint today.   Past Medical History  Diagnosis Date  . Anemia   . Other specified forms of chronic ischemic heart disease     echo 07/26/2008 EfF 30% with perapical HK  . Hypokalemia   . Unspecified essential hypertension   . Esophageal reflux   . Paralytic ileus   . CAD (coronary artery disease)     spontaneous LAD dissection presenting with anterior STEMI. s/p MI 07/24/08 with PCI Endeavor DES to Prox. LAD and BMA to prox. LCX.. relook cath 07/28/08 showing cont. patency of LAD to LCX.  Marland Kitchen Chronic systolic HF (heart failure)   . DDD (degenerative disc disease)   . GERD (gastroesophageal reflux disease)    Past Surgical History  Procedure Date  . Cholecystectomy     27 years ago  . Cardiac catheterization March 2010    2 cardiac stent placement in March 2010.   . Cardiac defibrillator placement december 2010    St. Jude  . Abdominal hysterectomy   . Bilateral salpingoophorectomy   . Total abdominal hysterectomy w/ bilateral salpingoophorectomy   . Left pelvic lymphadenectomy     by Dr. De Blanch     Current Outpatient Prescriptions  Medication Sig Dispense Refill  . ALPRAZolam (XANAX) 0.5 MG tablet Take 0.5 mg by mouth at bedtime as needed.        Marland Kitchen aspirin 81 MG tablet Take 81 mg by mouth daily.        . carvedilol (COREG) 25 MG tablet TAKE 1 TABLET TWICE A DAY  60 tablet  8  . clopidogrel (PLAVIX) 75 MG tablet Take 75 mg by mouth daily.        . digoxin (LANOXIN) 0.25 MG tablet TAKE 1 TABLET EVERY  DAY  30 tablet  4  . furosemide (LASIX) 40 MG tablet Take 40 mg by mouth daily.        Marland Kitchen glipiZIDE (GLUCOTROL) 10 MG tablet Take 10 mg by mouth 2 (two) times daily before a meal.       . nitroGLYCERIN (NITROSTAT) 0.4 MG SL tablet Place 1 tablet (0.4 mg total) under the tongue every 5 (five) minutes as needed.  25 tablet  3  . oxyCODONE-acetaminophen (PERCOCET) 5-325 MG per tablet Take 1 tablet by mouth every 4 (four) hours as needed.        . pantoprazole (PROTONIX) 40 MG tablet Take 40 mg by mouth daily.        . potassium chloride SA (K-DUR,KLOR-CON) 20 MEQ tablet       . simvastatin (ZOCOR) 40 MG tablet Take 40 mg by mouth.       . sitaGLIPtan-metformin (JANUMET) 50-1000 MG per tablet Take 1 tablet by mouth 2 (two) times daily with a meal.       . valsartan (DIOVAN) 40 MG tablet Take 1 tablet (40 mg total) by mouth daily.  30 tablet  6  . VITAMIN D, CHOLECALCIFEROL, PO Take 5,000 Units by mouth daily.          Allergies  Allergen Reactions  . Penicillins  History   Social History  . Marital Status: Single    Spouse Name: N/A    Number of Children: N/A  . Years of Education: N/A   Occupational History  . 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 Smoking status: Former Smoker -- 0.5 packs/day  . Smokeless tobacco: Not on file   Comment: used to smoke 1/2 ppd but has not smoked since discharge.   . Alcohol Use: Yes     Occasional   . Drug Use: Not on file  . Sexually Active: No   Other Topics Concern  . 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 Lives in [place] with a roommate.    Physical Exam: Filed Vitals:   11/27/11 0957  BP: 130/70  Pulse: 84  Resp: 18  Height: 5\' 5"  (1.651 m)  Weight: 170 lb 6.4 oz (77.293 kg)  SpO2: 93%    GEN- The patient is well appearing, alert and oriented x 3 today.   Head- normocephalic, atraumatic Eyes-  Sclera clear, conjunctiva pink Ears- hearing  intact Oropharynx- clear Neck- supple, no JVP Lymph- no cervical lymphadenopathy Lungs- Clear to ausculation bilaterally, normal work of breathing Heart- Regular rate and rhythm, no murmurs, rubs or gallops, PMI not laterally displaced GI- soft, NT, ND, + BS ICD pocket well healed Extremities- no clubbing, cyanosis, or edema  Device interrogation today is reviewed- see paceart  Assessment and Plan:

## 2011-11-27 NOTE — Patient Instructions (Addendum)
Your physician wants you to follow-up in: 12 months with Dr Jacquiline Doe will receive a reminder letter in the mail two months in advance. If you don't receive a letter, please call our office to schedule the follow-up appointment.   Remote monitoring is used to monitor your Pacemaker of ICD from home. This monitoring reduces the number of office visits required to check your device to one time per year. It allows [place] to keep an eye on the functioning of your device to ensure it is working properly. You are scheduled for a device check from home on 03/04/2012. You may send your transmission at any time that day. If you have a wireless device, the transmission will be sent automatically. After your physician reviews your transmission, you will receive a postcard with your next transmission date.

## 2011-11-27 NOTE — Assessment & Plan Note (Signed)
Normal ICD function See Pace Art report No changes today  

## 2012-01-02 ENCOUNTER — Other Ambulatory Visit: Payer: Self-pay | Admitting: Cardiovascular Disease

## 2012-03-04 ENCOUNTER — Encounter: Payer: Managed Care, Other (non HMO) | Admitting: *Deleted

## 2012-03-05 ENCOUNTER — Encounter: Payer: Self-pay | Admitting: *Deleted

## 2012-03-18 ENCOUNTER — Encounter: Payer: Self-pay | Admitting: Internal Medicine

## 2012-03-18 ENCOUNTER — Encounter: Payer: Self-pay | Admitting: *Deleted

## 2012-03-18 ENCOUNTER — Ambulatory Visit (INDEPENDENT_AMBULATORY_CARE_PROVIDER_SITE_OTHER): Payer: Managed Care, Other (non HMO) | Admitting: *Deleted

## 2012-03-18 DIAGNOSIS — I498 Other specified cardiac arrhythmias: Secondary | ICD-10-CM

## 2012-03-18 DIAGNOSIS — I2589 Other forms of chronic ischemic heart disease: Secondary | ICD-10-CM

## 2012-03-18 DIAGNOSIS — Z95810 Presence of automatic (implantable) cardiac defibrillator: Secondary | ICD-10-CM | POA: Insufficient documentation

## 2012-03-18 LAB — REMOTE ICD DEVICE
BRDY-0002RV: 40 {beats}/min
BRDY-0005RV: 30 {beats}/min
DEVICE MODEL ICD: 753204
HV IMPEDENCE: 72 Ohm
RV LEAD AMPLITUDE: 11.8 mv
TZON-0004SLOWVT: 24
TZON-0005SLOWVT: 6
TZON-0010SLOWVT: 80 ms
VENTRICULAR PACING ICD: 0 pct

## 2012-04-09 ENCOUNTER — Other Ambulatory Visit: Payer: Self-pay | Admitting: Cardiovascular Disease

## 2012-04-09 ENCOUNTER — Encounter: Payer: Self-pay | Admitting: *Deleted

## 2012-04-30 ENCOUNTER — Encounter: Payer: Self-pay | Admitting: Gynecologic Oncology

## 2012-04-30 ENCOUNTER — Ambulatory Visit: Payer: Managed Care, Other (non HMO) | Attending: Gynecologic Oncology | Admitting: Gynecologic Oncology

## 2012-04-30 VITALS — BP 104/60 | HR 60 | Temp 98.5°F | Resp 16 | Ht 64.76 in | Wt 163.3 lb

## 2012-04-30 DIAGNOSIS — C549 Malignant neoplasm of corpus uteri, unspecified: Secondary | ICD-10-CM | POA: Insufficient documentation

## 2012-04-30 DIAGNOSIS — C55 Malignant neoplasm of uterus, part unspecified: Secondary | ICD-10-CM

## 2012-04-30 NOTE — Progress Notes (Signed)
Follow Up Note: Gyn-Onc  Emily Mays 51 y.o. female  CC:  Chief Complaint  Patient presents with  . Uterine cancer    Follow up    HPI:  Emily Mays is a 51 year old referred by Dr. Ambrose Mantle for a grade 1 endometrial cancer.  On August 10, 2009, she underwent a total abdominal hysterectomy and bilateral salpingo-oophorectomy. Final pathology was notable for a grade 1 endometrial cancer with no myometrial invasion. Pelvic lymph nodes were without evidence of metastatic disease.  Her last visit with Dr. Nelly Rout in January of 2013 was unremarkable with no evidence of disease.    Interval History:  She presents today for continued follow up.  She was last seen by Dr. Ambrose Mantle 6 months ago with a normal pap smear result per patient.  She reports frequent hot flashes making her "wake up during the night with my t-shirt soaking wet."  Stating that she is unable to take the majority of medications for menopausal symptoms "because of my heart" so she has been "taking black cohosh over the counter since it is all natural and it seems to really help."  Denies abdominal pain, vaginal bleeding/discharge, or moderate rectal bleeding.  She states that she had a trace amount of rectal bleeding when wiping after several episodes of diarrhea one week ago.  She reports that she has had to cancel her appointment with genetics because she started working a new job on third shift and she has difficulty arranging her schedule since she sleeps during the day.  No other complaints voiced.  Up to date with mammogram.     Review of Systems  Constitutional: Feels well.  Cardiovascular: No chest pain, shortness of breath, or edema.  Pulmonary: No cough or wheeze.  Gastrointestinal: No nausea, vomiting, or diarrhea. No change in bowel movement.  One episode of trace amount of rectal bleeding with wiping.   Genitourinary: No frequency, urgency, or dysuria. No vaginal bleeding or discharge.  Musculoskeletal: No myalgia or joint pain Neurologic: No weakness, numbness, or change in gait.  Psychology: No depression, anxiety, or insomnia.  Current Meds:  Outpatient Encounter Prescriptions as of 04/30/2012  Medication Sig Dispense Refill  . ALPRAZolam (XANAX) 0.5 MG tablet Take 0.5 mg by mouth at bedtime as needed.        Marland Kitchen aspirin 81 MG tablet Take 81 mg by mouth daily.        . Canagliflozin (INVOKANA PO) Take 1 tablet by mouth daily.      . carvedilol (COREG) 25 MG tablet TAKE 1 TABLET TWICE A DAY  60 tablet  8  . clopidogrel (PLAVIX) 75 MG tablet TAKE 1 TABLET BY MOUTH DAILY  30 tablet  5  . digoxin (LANOXIN) 0.25 MG tablet TAKE 1 TABLET EVERY DAY  30 tablet  9  . furosemide (LASIX) 40 MG tablet Take 40 mg by mouth daily.        Marland Kitchen glipiZIDE (GLUCOTROL) 10 MG tablet Take 10 mg by mouth 2 (two) times daily before a meal.       . nitroGLYCERIN (NITROSTAT) 0.4 MG SL tablet Place 1 tablet (0.4 mg total) under the tongue every 5 (five) minutes as needed.  25 tablet  3  . oxyCODONE-acetaminophen (PERCOCET) 5-325 MG per tablet Take 1 tablet by mouth every 4 (four) hours as needed.        . pantoprazole (PROTONIX) 40 MG tablet Take 40 mg by mouth daily.        . potassium chloride  SA (K-DUR,KLOR-CON) 20 MEQ tablet       . simvastatin (ZOCOR) 40 MG tablet Take 40 mg by mouth.       . sitaGLIPtan-metformin (JANUMET) 50-1000 MG per tablet Take 1 tablet by mouth 2 (two) times daily with a meal.       . valsartan (DIOVAN) 40 MG tablet Take 1 tablet (40 mg total) by mouth daily.  30 tablet  6  . VITAMIN D, CHOLECALCIFEROL, PO Take 5,000 Units by mouth daily.        . [DISCONTINUED] clopidogrel (PLAVIX) 75 MG tablet Take 75 mg by mouth daily.          Allergy:  Allergies  Allergen Reactions  . Penicillins     Social Hx:   History   Social History  . Marital Status: Single    Spouse Name: N/A     Number of Children: N/A  . Years of Education: N/A   Occupational History  . 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 Smoking status: Former Smoker -- 0.5 packs/day  . Smokeless tobacco: Not on file     Comment: used to smoke 1/2 ppd but has not smoked since discharge.   . Alcohol Use: Yes     Comment: Occasional   . Drug Use: Not on file  . Sexually Active: No   Other Topics Concern  . 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 Lives in [place] with a roommate.     Past Surgical Hx:  Past Surgical History  Procedure Date  . Cholecystectomy     27 years ago  . Cardiac catheterization March 2010    2 cardiac stent placement in March 2010.   . Cardiac defibrillator placement december 2010    St. Jude  . Abdominal hysterectomy   . Bilateral salpingoophorectomy   . Total abdominal hysterectomy w/ bilateral salpingoophorectomy   . Left pelvic lymphadenectomy     by Dr. De Blanch     Past Medical Hx:  Past Medical History  Diagnosis Date  . Anemia   . Other specified forms of chronic ischemic heart disease     echo 07/26/2008 EfF 30% with perapical HK  . Hypokalemia   . Unspecified essential hypertension   . Esophageal reflux   . Paralytic ileus   . CAD (coronary artery disease)     spontaneous LAD dissection presenting with anterior STEMI. s/p MI 07/24/08 with PCI Endeavor DES to Prox. LAD and BMA to prox. LCX.. relook cath 07/28/08 showing cont. patency of LAD to LCX.  Marland Kitchen Chronic systolic HF (heart failure)   . DDD (degenerative disc disease)   . GERD (gastroesophageal reflux disease)     Family Hx:  Family History  Problem Relation Age of Onset  . Cancer Father     Bladder cancer  . Kidney cancer Paternal Aunt   . Breast cancer Paternal Aunt   . Throat cancer Paternal Uncle     Vitals:  Blood pressure 104/60, pulse 60, temperature 98.5 F (36.9 C), temperature source  Oral, resp. rate 16, height 5' 4.76" (1.645 m), weight 163 lb 4.8 oz (74.072 kg).  Physical Exam: General: Well developed, well nourished female in no acute distress. Alert and oriented x 3.  Neck: Supple without any enlargements.  Lymph node survey: No cervical, supraclavicular, or inguinal adenopathy  Cardiovascular: Regular rate and rhythm. S1 and S2 normal.  Lungs: Clear to auscultation bilaterally. No wheezes/crackles/rhonchi noted.  Skin: No rashes or lesions present. Back: No CVA tenderness  Abdomen: Abdomen soft, non-tender and obese. Active bowel sounds in all quadrants.  No herniations.  Genitourinary:    Vulva/vagina: Normal external female genitalia. No lesions.    Urethra: No lesions or masses    Vagina: Atrophic without any lesions. No palpable masses. No vaginal bleeding or drainage noted.  Rectal: Good tone.  No masses or cul de sac nodularity.  Extremities: No bilateral cyanosis or clubbing.   Assessment/Plan:  Emily Mays is a 51 year old with stage IA grade 1 endometrioid endometrial cancer.  She is currently without any evidence of disease.  She is to follow up with Dr. Ambrose Mantle in 6 months at which time a Pap test will be collected.  She is to follow up with GYN oncology in 12 months. She has been strongly advised to follow the recommendation for genetic counseling.  Survivorship packet with reportable signs and symptoms, preventative screening recommendations per SGO, and support services at the [facility] discussed with the patient.  She is instructed to notify the office if her hot flashes persist or worsen to discuss alternative treatment options.  Furthermore, she is instructed to monitor for rectal bleeding and to follow up with her primary care physician.    Emily Mays DEAL, NP 04/30/2012, 3:48 PM

## 2012-04-30 NOTE — Patient Instructions (Addendum)
Please follow up with Dr. Ambrose Mantle in 6 months at which time a Pap test will be collected.  Follow up with GYN oncology in 12 months. Please follow up on the recommendation for genetic counseling.  Review your survivorship packet with reportable signs and symptoms, preventative screening recommendations per SGO, and support services at the [facility].  Notify the office if your hot flashes persist or worsen to discuss alternative treatment options.  Furthermore, monitor for rectal bleeding and follow up with your primary care physician.      Thank you for coming to see me today.  I appreciate your confidence in choosing [facility] for your medical care.  If you have any questions about your visit today, please call our office and we will get back to you as soon as possible.  Warner Mccreedy, NP Gynecologic Oncology

## 2012-06-07 ENCOUNTER — Other Ambulatory Visit: Payer: Self-pay | Admitting: Emergency Medicine

## 2012-06-07 MED ORDER — VALSARTAN 40 MG PO TABS: 40.0000 mg | ORAL_TABLET | Freq: Every day | ORAL | Status: DC

## 2012-06-24 ENCOUNTER — Ambulatory Visit (INDEPENDENT_AMBULATORY_CARE_PROVIDER_SITE_OTHER): Payer: Managed Care, Other (non HMO) | Admitting: *Deleted

## 2012-06-24 ENCOUNTER — Encounter: Payer: Self-pay | Admitting: Internal Medicine

## 2012-06-24 ENCOUNTER — Other Ambulatory Visit: Payer: Self-pay | Admitting: Internal Medicine

## 2012-06-24 DIAGNOSIS — I2589 Other forms of chronic ischemic heart disease: Secondary | ICD-10-CM

## 2012-06-24 DIAGNOSIS — Z95810 Presence of automatic (implantable) cardiac defibrillator: Secondary | ICD-10-CM

## 2012-06-29 LAB — REMOTE ICD DEVICE
BRDY-0002RV: 40 {beats}/min
BRDY-0005RV: 30 {beats}/min
DEV-0020ICD: NEGATIVE
DEVICE MODEL ICD: 753204
HV IMPEDENCE: 75 Ohm
TZON-0010SLOWVT: 80 ms
VENTRICULAR PACING ICD: 0 pct

## 2012-07-03 ENCOUNTER — Other Ambulatory Visit: Payer: Self-pay | Admitting: Cardiovascular Disease

## 2012-07-08 ENCOUNTER — Other Ambulatory Visit: Payer: Self-pay | Admitting: Cardiovascular Disease

## 2012-07-22 ENCOUNTER — Encounter: Payer: Self-pay | Admitting: *Deleted

## 2012-09-23 ENCOUNTER — Other Ambulatory Visit: Payer: Self-pay | Admitting: Internal Medicine

## 2012-09-23 ENCOUNTER — Encounter: Payer: Self-pay | Admitting: Internal Medicine

## 2012-09-23 ENCOUNTER — Ambulatory Visit (INDEPENDENT_AMBULATORY_CARE_PROVIDER_SITE_OTHER): Payer: Managed Care, Other (non HMO) | Admitting: *Deleted

## 2012-09-23 DIAGNOSIS — R609 Edema, unspecified: Secondary | ICD-10-CM

## 2012-09-23 DIAGNOSIS — Z95810 Presence of automatic (implantable) cardiac defibrillator: Secondary | ICD-10-CM

## 2012-09-23 DIAGNOSIS — I2589 Other forms of chronic ischemic heart disease: Secondary | ICD-10-CM

## 2012-09-23 LAB — REMOTE ICD DEVICE
HV IMPEDENCE: 73 Ohm
RV LEAD AMPLITUDE: 2 mv
RV LEAD IMPEDENCE ICD: 410 Ohm
TZON-0005SLOWVT: 6
VENTRICULAR PACING ICD: 1 pct

## 2012-10-02 ENCOUNTER — Encounter: Payer: Self-pay | Admitting: *Deleted

## 2012-10-28 ENCOUNTER — Other Ambulatory Visit: Payer: Self-pay | Admitting: Cardiovascular Disease

## 2012-11-06 ENCOUNTER — Other Ambulatory Visit: Payer: Self-pay | Admitting: Cardiovascular Disease

## 2012-11-27 ENCOUNTER — Encounter: Payer: Self-pay | Admitting: Internal Medicine

## 2012-11-27 ENCOUNTER — Ambulatory Visit (INDEPENDENT_AMBULATORY_CARE_PROVIDER_SITE_OTHER): Payer: Managed Care, Other (non HMO) | Admitting: Internal Medicine

## 2012-11-27 VITALS — BP 104/71 | HR 88 | Ht 64.0 in | Wt 159.4 lb

## 2012-11-27 DIAGNOSIS — R0609 Other forms of dyspnea: Secondary | ICD-10-CM

## 2012-11-27 DIAGNOSIS — Z95810 Presence of automatic (implantable) cardiac defibrillator: Secondary | ICD-10-CM

## 2012-11-27 DIAGNOSIS — I2589 Other forms of chronic ischemic heart disease: Secondary | ICD-10-CM

## 2012-11-27 DIAGNOSIS — I10 Essential (primary) hypertension: Secondary | ICD-10-CM

## 2012-11-27 DIAGNOSIS — R0600 Dyspnea, unspecified: Secondary | ICD-10-CM

## 2012-11-27 LAB — ICD DEVICE OBSERVATION
BRDY-0005RV: 30 {beats}/min
DEVICE MODEL ICD: 753204
RV LEAD AMPLITUDE: 11.8 mv
RV LEAD IMPEDENCE ICD: 460 Ohm
RV LEAD THRESHOLD: 1 V
TZON-0004SLOWVT: 24
TZON-0010SLOWVT: 80 ms

## 2012-11-27 LAB — BASIC METABOLIC PANEL
CO2: 27 mEq/L (ref 19–32)
Calcium: 10.1 mg/dL (ref 8.4–10.5)
GFR: 71.48 mL/min (ref >60.00)
Sodium: 138 mEq/L (ref 135–145)

## 2012-11-27 LAB — CBC WITH DIFFERENTIAL/PLATELET
Basophils Relative: 0.9 % (ref 0.0–3.0)
Eosinophils Relative: 1.9 % (ref 0.0–5.0)
Hemoglobin: 12.2 g/dL (ref 12.0–15.0)
Lymphocytes Relative: 29.5 % (ref 12.0–46.0)
MCHC: 33.1 g/dL (ref 30.0–36.0)
Monocytes Relative: 6 % (ref 3.0–12.0)
Neutro Abs: 4.6 10*3/uL (ref 1.4–7.7)
RBC: 4.13 Mil/uL (ref 3.87–5.11)
WBC: 7.5 10*3/uL (ref 4.5–10.5)

## 2012-11-27 NOTE — Progress Notes (Signed)
PCP: Galvin Proffer, MD Primary Cardiologist: Calton Dach, MD  Emily Mays is a 52 y.o. female who presents today for routine electrophysiology followup.  Since last being seen in our clinic, the patient reports doing very well.  She has not seen Dr Excell Seltzer recently.  She states that she has had some problems with dyspnea on exertion.  This is not associated with orthopnea, chest pain or lower extremity edema.  Today, she denies symptoms of palpitations, chest pain,  lower extremity edema, dizziness, presyncope, syncope, or ICD shocks.  The patient is otherwise without complaint today.   Past Medical History  Diagnosis Date  . Anemia   . Other specified forms of chronic ischemic heart disease     echo 07/26/2008 EfF 30% with perapical HK  . Hypokalemia   . Unspecified essential hypertension   . Esophageal reflux   . Paralytic ileus   . CAD (coronary artery disease)     spontaneous LAD dissection presenting with anterior STEMI. s/p MI 07/24/08 with PCI Endeavor DES to Prox. LAD and BMA to prox. LCX.. relook cath 07/28/08 showing cont. patency of LAD to LCX.  Marland Kitchen Chronic systolic HF (heart failure)   . DDD (degenerative disc disease)   . GERD (gastroesophageal reflux disease)    Past Surgical History  Procedure Laterality Date  . Cholecystectomy      27 years ago  . Cardiac catheterization  March 2010    2 cardiac stent placement in March 2010.   . Cardiac defibrillator placement  december 2010    St. Jude  . Abdominal hysterectomy    . Bilateral salpingoophorectomy    . Total abdominal hysterectomy w/ bilateral salpingoophorectomy    . Left pelvic lymphadenectomy      by Dr. De Blanch     Current Outpatient Prescriptions  Medication Sig Dispense Refill  . ALPRAZolam (XANAX) 0.5 MG tablet Take 0.5 mg by mouth at bedtime as needed.        Marland Kitchen aspirin 81 MG tablet Take 81 mg by mouth daily.        . Canagliflozin (INVOKANA PO) Take 1 tablet by mouth daily.      .  carvedilol (COREG) 25 MG tablet TAKE 1 TABLET TWICE A DAY  60 tablet  8  . clopidogrel (PLAVIX) 75 MG tablet TAKE 1 TABLET BY MOUTH DAILY  30 tablet  5  . digoxin (LANOXIN) 0.25 MG tablet TAKE 1 TABLET EVERY DAY  30 tablet  1  . furosemide (LASIX) 40 MG tablet Take 40 mg by mouth daily.        Marland Kitchen glipiZIDE (GLUCOTROL) 10 MG tablet Take 10 mg by mouth 2 (two) times daily before a meal.       . nitroGLYCERIN (NITROSTAT) 0.4 MG SL tablet Place 1 tablet (0.4 mg total) under the tongue every 5 (five) minutes as needed.  25 tablet  3  . oxyCODONE-acetaminophen (PERCOCET) 5-325 MG per tablet Take 1 tablet by mouth every 4 (four) hours as needed.        . pantoprazole (PROTONIX) 40 MG tablet Take 40 mg by mouth daily.        . potassium chloride SA (K-DUR,KLOR-CON) 20 MEQ tablet       . simvastatin (ZOCOR) 40 MG tablet Take 40 mg by mouth.       . sitaGLIPtan-metformin (JANUMET) 50-1000 MG per tablet Take 1 tablet by mouth 2 (two) times daily with a meal.       . valsartan (  DIOVAN) 40 MG tablet Take 1 tablet (40 mg total) by mouth daily.  30 tablet  6  . VITAMIN D, CHOLECALCIFEROL, PO Take 5,000 Units by mouth daily.         No current facility-administered medications for this visit.    Physical Exam: Filed Vitals:   11/27/12 0944  BP: 104/71  Pulse: 88  Height: 5\' 4"  (1.626 m)  Weight: 159 lb 6.4 oz (72.303 kg)    GEN- The patient is well appearing, alert and oriented x 3 today.   Head- normocephalic, atraumatic Eyes-  Sclera clear, conjunctiva pink Ears- hearing intact Oropharynx- clear Lungs- Clear to ausculation bilaterally, normal work of breathing Chest- ICD pocket is well healed Heart- Regular rate and rhythm, no murmurs, rubs or gallops, PMI not laterally displaced GI- soft, NT, ND, + BS Extremities- no clubbing, cyanosis, or edema  ICD interrogation- reviewed in detail today,  See PACEART report  Assessment and Plan:  1. Ischemic CM Normal ICD function See Arita Miss Art  report I am concerned about worsening ischemia as a potential cause for her SOB. Will obtain an echo to evaluate for structural changes.  Also check BMET, CBC, dig level today. Her Coreview is not elevated to suggest volume increase and she does not appear fluid overloaded on exam. She will need close follow-up with Dr Excell Seltzer.  She may require additional CV risk assessment if not improved. No changes today  2. HTN Stable No change required today  Merlin Return to the device clinic in 1 year

## 2012-11-27 NOTE — Patient Instructions (Addendum)
Remote monitoring is used to monitor your Pacemaker of ICD from home. This monitoring reduces the number of office visits required to check your device to one time per year. It allows [place] to keep an eye on the functioning of your device to ensure it is working properly. You are scheduled for a device check from home on March 03, 2013. You may send your transmission at any time that day. If you have a wireless device, the transmission will be sent automatically. After your physician reviews your transmission, you will receive a postcard with your next transmission date.  Your physician wants you to follow-up in: 1 year with Dr Johney Frame.  You will receive a reminder letter in the mail two months in advance. If you don't receive a letter, please call our office to schedule the follow-up appointment.  Your physician has requested that you have an echocardiogram. Echocardiography is a painless test that uses sound waves to create images of your heart. It provides your doctor with information about the size and shape of your heart and how well your heart's chambers and valves are working. This procedure takes approximately one hour. There are no restrictions for this procedure.   Your physician recommends that you HAVE LAB WORK TODAY  FOLLOW UP WITH DR Excell Seltzer NEXT AVAILABLE

## 2012-11-27 NOTE — Addendum Note (Signed)
Addended by: Freddi Starr on: 11/27/2012 10:12 AM   Modules accepted: Orders

## 2012-11-29 ENCOUNTER — Other Ambulatory Visit: Payer: Self-pay | Admitting: *Deleted

## 2012-11-29 ENCOUNTER — Encounter: Payer: Self-pay | Admitting: *Deleted

## 2012-11-29 DIAGNOSIS — Z79899 Other long term (current) drug therapy: Secondary | ICD-10-CM

## 2012-11-29 DIAGNOSIS — R7889 Finding of other specified substances, not normally found in blood: Secondary | ICD-10-CM

## 2012-11-29 MED ORDER — DIGOXIN 250 MCG PO TABS: 0.1250 mg | ORAL_TABLET | Freq: Every day | ORAL | Status: DC

## 2012-12-05 ENCOUNTER — Ambulatory Visit (HOSPITAL_COMMUNITY): Payer: Managed Care, Other (non HMO) | Attending: Cardiovascular Disease | Admitting: Radiology

## 2012-12-05 DIAGNOSIS — I10 Essential (primary) hypertension: Secondary | ICD-10-CM | POA: Insufficient documentation

## 2012-12-05 DIAGNOSIS — I379 Nonrheumatic pulmonary valve disorder, unspecified: Secondary | ICD-10-CM | POA: Insufficient documentation

## 2012-12-05 DIAGNOSIS — I509 Heart failure, unspecified: Secondary | ICD-10-CM | POA: Insufficient documentation

## 2012-12-05 DIAGNOSIS — R0609 Other forms of dyspnea: Secondary | ICD-10-CM | POA: Insufficient documentation

## 2012-12-05 DIAGNOSIS — I252 Old myocardial infarction: Secondary | ICD-10-CM | POA: Insufficient documentation

## 2012-12-05 DIAGNOSIS — R0600 Dyspnea, unspecified: Secondary | ICD-10-CM

## 2012-12-05 DIAGNOSIS — I059 Rheumatic mitral valve disease, unspecified: Secondary | ICD-10-CM | POA: Insufficient documentation

## 2012-12-05 DIAGNOSIS — I079 Rheumatic tricuspid valve disease, unspecified: Secondary | ICD-10-CM | POA: Insufficient documentation

## 2012-12-05 DIAGNOSIS — R0602 Shortness of breath: Secondary | ICD-10-CM | POA: Insufficient documentation

## 2012-12-05 DIAGNOSIS — E785 Hyperlipidemia, unspecified: Secondary | ICD-10-CM | POA: Insufficient documentation

## 2012-12-05 DIAGNOSIS — R0989 Other specified symptoms and signs involving the circulatory and respiratory systems: Secondary | ICD-10-CM | POA: Insufficient documentation

## 2012-12-05 DIAGNOSIS — I2589 Other forms of chronic ischemic heart disease: Secondary | ICD-10-CM

## 2012-12-05 NOTE — Progress Notes (Addendum)
Echocardiogram performed. Report faxed  

## 2012-12-11 ENCOUNTER — Encounter: Payer: Self-pay | Admitting: Cardiovascular Disease

## 2012-12-11 ENCOUNTER — Other Ambulatory Visit: Payer: Managed Care, Other (non HMO)

## 2012-12-11 ENCOUNTER — Ambulatory Visit (INDEPENDENT_AMBULATORY_CARE_PROVIDER_SITE_OTHER): Payer: Managed Care, Other (non HMO) | Admitting: Cardiovascular Disease

## 2012-12-11 VITALS — BP 100/68 | HR 98 | Ht 64.0 in | Wt 160.0 lb

## 2012-12-11 DIAGNOSIS — R0609 Other forms of dyspnea: Secondary | ICD-10-CM

## 2012-12-11 DIAGNOSIS — R0989 Other specified symptoms and signs involving the circulatory and respiratory systems: Secondary | ICD-10-CM

## 2012-12-11 DIAGNOSIS — I2589 Other forms of chronic ischemic heart disease: Secondary | ICD-10-CM

## 2012-12-11 DIAGNOSIS — R7889 Finding of other specified substances, not normally found in blood: Secondary | ICD-10-CM

## 2012-12-11 LAB — BASIC METABOLIC PANEL
CO2: 29 mEq/L (ref 19–32)
Calcium: 10 mg/dL (ref 8.4–10.5)
Creatinine, Ser: 0.8 mg/dL (ref 0.4–1.2)
GFR: 82.01 mL/min (ref >60.00)
Glucose, Bld: 178 mg/dL — ABNORMAL HIGH (ref 70–99)
Sodium: 141 mEq/L (ref 135–145)

## 2012-12-11 MED ORDER — SPIRONOLACTONE 25 MG PO TABS: 12.5000 mg | ORAL_TABLET | Freq: Every day | ORAL | Status: DC

## 2012-12-11 MED ORDER — FUROSEMIDE 20 MG PO TABS: 20.0000 mg | ORAL_TABLET | Freq: Every day | ORAL | Status: DC

## 2012-12-11 NOTE — Patient Instructions (Addendum)
Your physician has recommended you make the following change in your medication: DECREASE Furosmide to 20mg  take one by mouth daily, STOP Potassium, START Spironolactone 25mg  take one-half tablet by mouth daily  Your physician recommends that you return for lab work in: 2 WEEKS (BMP, Digoxin level and BNP)  Your physician has requested that you have an echocardiogram in 6 MONTHS. Echocardiography is a painless test that uses sound waves to create images of your heart. It provides your doctor with information about the size and shape of your heart and how well your heart's chambers and valves are working. This procedure takes approximately one hour. There are no restrictions for this procedure.  Your physician wants you to follow-up in: 6 MONTHS with Dr Excell Seltzer. You will receive a reminder letter in the mail two months in advance. If you don't receive a letter, please call our office to schedule the follow-up appointment.

## 2012-12-11 NOTE — Progress Notes (Signed)
HPI:   52 year old woman presenting for followup evaluation. She is followed for coronary artery disease and presented in 2010 with a spontaneous dissection of the LAD. This required extensive stenting of the LAD and left circumflex. She dissected back to the left main. She went on to develop a severe ischemic cardiomyopathy and was treated with an ICD for primary prevention of sudden cardiac death.  The patient feels pretty well. She was having some problems with leg pain and she was treated with Cymbalta. She had a lot of problems with this. She developed nausea, dizziness and weakness. Her symptoms are now resolved after she stopped Cymbalta. She does complain of exertional dyspnea but denies any major change in her breathing. She is able to work 12 hour shifts at night. She denies chest pain or pressure with exertion, orthopnea, PND, or leg swelling. She's had no syncope or palpitations.  Outpatient Encounter Prescriptions as of 12/11/2012  Medication Sig Dispense Refill  . ALPRAZolam (XANAX) 0.5 MG tablet Take 0.5 mg by mouth at bedtime as needed.        Marland Kitchen aspirin 81 MG tablet Take 81 mg by mouth daily.        . Canagliflozin (INVOKANA PO) Take 1 tablet by mouth daily.      . carvedilol (COREG) 25 MG tablet TAKE 1 TABLET TWICE A DAY  60 tablet  8  . clopidogrel (PLAVIX) 75 MG tablet TAKE 1 TABLET BY MOUTH DAILY  30 tablet  5  . digoxin (LANOXIN) 0.25 MG tablet Take 0.5 tablets (0.125 mg total) by mouth daily.  30 tablet  1  . furosemide (LASIX) 40 MG tablet Take 40 mg by mouth daily.        Marland Kitchen glipiZIDE (GLUCOTROL) 10 MG tablet Take 10 mg by mouth 2 (two) times daily before a meal.       . nitroGLYCERIN (NITROSTAT) 0.4 MG SL tablet Place 1 tablet (0.4 mg total) under the tongue every 5 (five) minutes as needed.  25 tablet  3  . oxyCODONE-acetaminophen (PERCOCET) 5-325 MG per tablet Take 1 tablet by mouth every 4 (four) hours as needed.        . pantoprazole (PROTONIX) 40 MG tablet Take 40 mg  by mouth daily.        . potassium chloride SA (K-DUR,KLOR-CON) 20 MEQ tablet       . simvastatin (ZOCOR) 40 MG tablet Take 40 mg by mouth.       . sitaGLIPtan-metformin (JANUMET) 50-1000 MG per tablet Take 1 tablet by mouth 2 (two) times daily with a meal.       . valsartan (DIOVAN) 40 MG tablet Take 1 tablet (40 mg total) by mouth daily.  30 tablet  6  . VITAMIN D, CHOLECALCIFEROL, PO Take 5,000 Units by mouth daily.         No facility-administered encounter medications on file as of 12/11/2012.    Allergies  Allergen Reactions  . Duloxetine Hcl     Made heart race  . Penicillins     Past Medical History  Diagnosis Date  . Anemia   . Other specified forms of chronic ischemic heart disease     echo 07/26/2008 EfF 30% with perapical HK  . Hypokalemia   . Unspecified essential hypertension   . Esophageal reflux   . Paralytic ileus   . CAD (coronary artery disease)     spontaneous LAD dissection presenting with anterior STEMI. s/p MI 07/24/08 with PCI Endeavor DES to  Prox. LAD and BMA to prox. LCX.. relook cath 07/28/08 showing cont. patency of LAD to LCX.  Marland Kitchen Chronic systolic HF (heart failure)   . DDD (degenerative disc disease)   . GERD (gastroesophageal reflux disease)    ROS: Negative except as per HPI  BP 100/68  Pulse 98  Ht 5\' 4"  (1.626 m)  Wt 160 lb (72.576 kg)  BMI 27.45 kg/m2  PHYSICAL EXAM: Pt is alert and oriented, NAD HEENT: normal Neck: JVP - normal, carotids 2+= without bruits Lungs: CTA bilaterally CV: RRR without murmur or gallop Abd: soft, NT, Positive BS, no hepatomegaly Ext: no C/C/E, distal pulses intact and equal Skin: warm/dry no rash  2D Echo: Study Conclusions  - Left ventricle: Septal and apical akinesis Severe inferior wall hypokinesis The cavity size was severely dilated. Wall thickness was normal. Systolic function was severely reduced. The estimated ejection fraction was in the range of 25% to 30%. - Left atrium: The atrium was mildly  dilated. - Atrial septum: No defect or patent foramen ovale was identified. - Pulmonary arteries: PA peak pressure: 34mm Hg (S).  ASSESSMENT AND PLAN: 1. Chronic systolic heart failure with severe ischemic cardiomyopathy. Her left ventricular ejection fraction has decreased since her previous study. We talked about consideration of cardiac catheterization to evaluate for progressive CAD or stent restenosis. She's having no symptoms of angina and is reluctant to have another heart cath. She would like to avoid this if at all possible. I think we can adjust her medication and reassess her echocardiogram in 6 months. Lasix will be reduced to 20 mg, potassium will be discontinued, and Aldactone 12.5 mg will be added. Will check a metabolic panel and BNP in 2 weeks.  2. CAD, native vessel. Stable without anginal symptoms. She remains on dual antiplatelet therapy. Her medications are appropriate. Discussion about cardiac catheterization as above. Will defer for now.  3. Ischemic cardiomyopathy status post ICD implantation. Followed by Dr. Johney Frame.  I will see the patient in followup in 6 months with an echocardiogram before her return visit.  Tonny Bollman 12/11/2012 3:15 PM

## 2012-12-12 LAB — DIGOXIN LEVEL: Digoxin Level: 0.6 ng/mL — ABNORMAL LOW (ref 0.8–2.0)

## 2012-12-13 ENCOUNTER — Other Ambulatory Visit: Payer: Managed Care, Other (non HMO)

## 2012-12-13 ENCOUNTER — Other Ambulatory Visit: Payer: Self-pay

## 2012-12-13 DIAGNOSIS — I2589 Other forms of chronic ischemic heart disease: Secondary | ICD-10-CM

## 2012-12-24 ENCOUNTER — Other Ambulatory Visit: Payer: Managed Care, Other (non HMO)

## 2013-01-09 ENCOUNTER — Other Ambulatory Visit: Payer: Self-pay | Admitting: Internal Medicine

## 2013-01-09 ENCOUNTER — Other Ambulatory Visit: Payer: Self-pay | Admitting: Cardiovascular Disease

## 2013-01-09 DIAGNOSIS — R7889 Finding of other specified substances, not normally found in blood: Secondary | ICD-10-CM

## 2013-01-09 MED ORDER — DIGOXIN 250 MCG PO TABS: 0.1250 mg | ORAL_TABLET | Freq: Every day | ORAL | Status: DC

## 2013-02-05 ENCOUNTER — Ambulatory Visit: Payer: Managed Care, Other (non HMO) | Admitting: Cardiovascular Disease

## 2013-03-03 ENCOUNTER — Encounter: Payer: Managed Care, Other (non HMO) | Admitting: *Deleted

## 2013-03-06 ENCOUNTER — Encounter: Payer: Self-pay | Admitting: *Deleted

## 2013-03-25 ENCOUNTER — Encounter: Payer: Self-pay | Admitting: Internal Medicine

## 2013-03-25 ENCOUNTER — Telehealth: Payer: Self-pay | Admitting: Internal Medicine

## 2013-03-25 ENCOUNTER — Ambulatory Visit (INDEPENDENT_AMBULATORY_CARE_PROVIDER_SITE_OTHER): Payer: Managed Care, Other (non HMO)

## 2013-03-25 DIAGNOSIS — Z95810 Presence of automatic (implantable) cardiac defibrillator: Secondary | ICD-10-CM

## 2013-03-25 DIAGNOSIS — I2589 Other forms of chronic ischemic heart disease: Secondary | ICD-10-CM

## 2013-03-25 LAB — MDC_IDC_ENUM_SESS_TYPE_REMOTE
Battery Remaining Percentage: 68 %
Battery Voltage: 2.96 V
Brady Statistic RV Percent Paced: 1 %
HighPow Impedance: 66 Ohm
Implantable Pulse Generator Serial Number: 753204
Lead Channel Impedance Value: 450 Ohm
Lead Channel Pacing Threshold Amplitude: 1 V
Lead Channel Pacing Threshold Pulse Width: 0.4 ms
Lead Channel Setting Pacing Amplitude: 2.5 V
Lead Channel Setting Pacing Pulse Width: 0.4 ms

## 2013-03-25 NOTE — Telephone Encounter (Signed)
New problem    Pt got letter about not receiving any results.    She is calling needs help doing this.

## 2013-03-25 NOTE — Telephone Encounter (Signed)
Instructions given to patient on sending remote transmission.

## 2013-04-02 ENCOUNTER — Encounter: Payer: Self-pay | Admitting: *Deleted

## 2013-04-23 ENCOUNTER — Other Ambulatory Visit: Payer: Self-pay | Admitting: Cardiovascular Disease

## 2013-05-13 ENCOUNTER — Telehealth: Payer: Self-pay | Admitting: Cardiovascular Disease

## 2013-05-13 ENCOUNTER — Ambulatory Visit (INDEPENDENT_AMBULATORY_CARE_PROVIDER_SITE_OTHER): Payer: Managed Care, Other (non HMO) | Admitting: *Deleted

## 2013-05-13 DIAGNOSIS — I428 Other cardiomyopathies: Secondary | ICD-10-CM

## 2013-05-13 NOTE — Progress Notes (Signed)
Pt seen for pain in shoulder and thought to be device related. No redness, swelling, or fever at site. Interrogation showed no episodes and device functioning normally. Follow up as planned.

## 2013-05-13 NOTE — Telephone Encounter (Signed)
Pt scheduled to be checked in office this pm/kwm

## 2013-05-13 NOTE — Telephone Encounter (Signed)
New Message  Pt states she woke up with pain near her DEFIB// while sleeping when she would rollover she would have dizzy spells.. please call to assist. Pt requests that this message goes to the office of Dr. Excell Seltzer ( No pain as of now// please call)

## 2013-05-14 ENCOUNTER — Ambulatory Visit: Payer: Managed Care, Other (non HMO) | Admitting: *Deleted

## 2013-05-14 ENCOUNTER — Telehealth: Payer: Self-pay | Admitting: *Deleted

## 2013-05-14 ENCOUNTER — Telehealth: Payer: Self-pay | Admitting: Cardiovascular Disease

## 2013-05-14 ENCOUNTER — Ambulatory Visit (INDEPENDENT_AMBULATORY_CARE_PROVIDER_SITE_OTHER): Payer: Managed Care, Other (non HMO) | Admitting: Internal Medicine

## 2013-05-14 ENCOUNTER — Encounter: Payer: Self-pay | Admitting: *Deleted

## 2013-05-14 DIAGNOSIS — I10 Essential (primary) hypertension: Secondary | ICD-10-CM

## 2013-05-14 DIAGNOSIS — T82198A Other mechanical complication of other cardiac electronic device, initial encounter: Secondary | ICD-10-CM

## 2013-05-14 DIAGNOSIS — T82118A Breakdown (mechanical) of other cardiac electronic device, initial encounter: Secondary | ICD-10-CM

## 2013-05-14 DIAGNOSIS — I2589 Other forms of chronic ischemic heart disease: Secondary | ICD-10-CM

## 2013-05-14 NOTE — Telephone Encounter (Signed)
Pt called answering service stating her device had vibrated twice last night.  Asked patient to send manual transmission for device review.  Will call patient back after transmission available.

## 2013-05-14 NOTE — Telephone Encounter (Signed)
Pt sent transmission b/c she received a vibrator alert from her device. Transmission did not give full interrogation data. I'm having pt come in this afternoon to clarify with a programmer what her alert meant.

## 2013-05-14 NOTE — Telephone Encounter (Signed)
New Message  Pt called states that her Defib went off twice at 7:09 this morning and it began to vibrate// sent a signal. request a call back for results.

## 2013-05-14 NOTE — Telephone Encounter (Signed)
Follow up    defiberlator went off twice this morning. Call forwarded to charles.

## 2013-05-14 NOTE — Patient Instructions (Signed)
See instruction sheet for gen change

## 2013-05-15 MED ORDER — CHLORHEXIDINE GLUCONATE 4 % EX LIQD
60.0000 mL | Freq: Once | CUTANEOUS | Status: DC
  Filled 2013-05-15: qty 60

## 2013-05-15 MED ORDER — GENTAMICIN SULFATE 40 MG/ML IJ SOLN
80.0000 mg | INTRAMUSCULAR | Status: AC
  Filled 2013-05-15: qty 2

## 2013-05-15 MED ORDER — VANCOMYCIN HCL IN DEXTROSE 1-5 GM/200ML-% IV SOLN
1000.0000 mg | INTRAVENOUS | Status: AC
  Filled 2013-05-15: qty 200

## 2013-05-16 ENCOUNTER — Encounter (HOSPITAL_COMMUNITY)
Admission: RE | Disposition: A | Discharge status: Home / Self Care | Payer: Self-pay | Source: Ambulatory Visit | Attending: Internal Medicine

## 2013-05-16 ENCOUNTER — Encounter (HOSPITAL_COMMUNITY): Payer: Self-pay | Admitting: *Deleted

## 2013-05-16 ENCOUNTER — Ambulatory Visit (HOSPITAL_COMMUNITY)
Admission: RE | Admit: 2013-05-16 | Discharge: 2013-05-16 | Disposition: A | Discharge status: Home / Self Care | Payer: Managed Care, Other (non HMO) | Source: Ambulatory Visit | Attending: Internal Medicine | Admitting: Internal Medicine

## 2013-05-16 ENCOUNTER — Encounter (HOSPITAL_COMMUNITY): Payer: Self-pay | Admitting: Pharmacy Technician

## 2013-05-16 DIAGNOSIS — I10 Essential (primary) hypertension: Secondary | ICD-10-CM | POA: Insufficient documentation

## 2013-05-16 DIAGNOSIS — I2589 Other forms of chronic ischemic heart disease: Secondary | ICD-10-CM | POA: Insufficient documentation

## 2013-05-16 DIAGNOSIS — IMO0002 Reserved for concepts with insufficient information to code with codable children: Secondary | ICD-10-CM | POA: Insufficient documentation

## 2013-05-16 DIAGNOSIS — I2510 Atherosclerotic heart disease of native coronary artery without angina pectoris: Secondary | ICD-10-CM | POA: Insufficient documentation

## 2013-05-16 DIAGNOSIS — T82118A Breakdown (mechanical) of other cardiac electronic device, initial encounter: Secondary | ICD-10-CM

## 2013-05-16 DIAGNOSIS — T82198A Other mechanical complication of other cardiac electronic device, initial encounter: Secondary | ICD-10-CM | POA: Insufficient documentation

## 2013-05-16 DIAGNOSIS — I5022 Chronic systolic (congestive) heart failure: Secondary | ICD-10-CM | POA: Insufficient documentation

## 2013-05-16 DIAGNOSIS — I255 Ischemic cardiomyopathy: Secondary | ICD-10-CM | POA: Diagnosis present

## 2013-05-16 DIAGNOSIS — Y831 Surgical operation with implant of artificial internal device as the cause of abnormal reaction of the patient, or of later complication, without mention of misadventure at the time of the procedure: Secondary | ICD-10-CM | POA: Insufficient documentation

## 2013-05-16 DIAGNOSIS — I509 Heart failure, unspecified: Secondary | ICD-10-CM | POA: Insufficient documentation

## 2013-05-16 DIAGNOSIS — K219 Gastro-esophageal reflux disease without esophagitis: Secondary | ICD-10-CM | POA: Insufficient documentation

## 2013-05-16 HISTORY — PX: IMPLANTABLE CARDIOVERTER DEFIBRILLATOR GENERATOR CHANGE: SHX5474

## 2013-05-16 LAB — MDC_IDC_ENUM_SESS_TYPE_INCLINIC
Battery Remaining Longevity: 70.8 mo
Brady Statistic RV Percent Paced: 0 %
Date Time Interrogation Session: 20141231185910
HighPow Impedance: 72 Ohm
Implantable Pulse Generator Serial Number: 753204
Lead Channel Impedance Value: 537.5 Ohm
Lead Channel Sensing Intrinsic Amplitude: 12 mV
Lead Channel Setting Pacing Amplitude: 2.5 V
Lead Channel Setting Pacing Pulse Width: 0.4 ms
Lead Channel Setting Sensing Sensitivity: 0.5 mV
Zone Setting Detection Interval: 300 ms
Zone Setting Detection Interval: 350 ms

## 2013-05-16 LAB — BASIC METABOLIC PANEL
BUN: 15 mg/dL (ref 6–23)
CO2: 21 meq/L (ref 19–32)
Calcium: 10 mg/dL (ref 8.4–10.5)
Chloride: 102 mEq/L (ref 96–112)
Creatinine, Ser: 0.67 mg/dL (ref 0.50–1.10)
GFR calc non Af Amer: >90 mL/min (ref >90)
Glucose, Bld: 167 mg/dL — ABNORMAL HIGH (ref 70–99)
POTASSIUM: 4.2 meq/L (ref 3.7–5.3)
Sodium: 141 mEq/L (ref 137–147)

## 2013-05-16 LAB — CBC
HCT: 37.5 % (ref 36.0–46.0)
Hemoglobin: 12.6 g/dL (ref 12.0–15.0)
MCH: 28.9 pg (ref 26.0–34.0)
MCHC: 33.6 g/dL (ref 30.0–36.0)
MCV: 86 fL (ref 78.0–100.0)
Platelets: 189 10*3/uL (ref 150–400)
RBC: 4.36 MIL/uL (ref 3.87–5.11)
RDW: 13.9 % (ref 11.5–15.5)
WBC: 8.1 10*3/uL (ref 4.0–10.5)

## 2013-05-16 LAB — GLUCOSE, CAPILLARY: GLUCOSE-CAPILLARY: 170 mg/dL — AB (ref 70–99)

## 2013-05-16 LAB — SURGICAL PCR SCREEN
MRSA, PCR: NEGATIVE
STAPHYLOCOCCUS AUREUS: NEGATIVE

## 2013-05-16 SURGERY — IMPLANTABLE CARDIOVERTER DEFIBRILLATOR GENERATOR CHANGE
Anesthesia: LOCAL

## 2013-05-16 MED ORDER — FENTANYL CITRATE 0.05 MG/ML IJ SOLN
INTRAMUSCULAR | Status: AC
  Filled 2013-05-16: qty 2

## 2013-05-16 MED ORDER — ACETAMINOPHEN 325 MG PO TABS
325.0000 mg | ORAL_TABLET | ORAL | Status: DC | PRN
Start: 2013-05-16 — End: 2013-05-16

## 2013-05-16 MED ORDER — HYDROCODONE-ACETAMINOPHEN 5-325 MG PO TABS: 1.0000 | ORAL_TABLET | ORAL | Status: DC | PRN

## 2013-05-16 MED ORDER — SODIUM CHLORIDE 0.9 % IJ SOLN: 3.0000 mL | INTRAMUSCULAR | Status: DC | PRN

## 2013-05-16 MED ORDER — LIDOCAINE HCL (PF) 1 % IJ SOLN
INTRAMUSCULAR | Status: AC
  Filled 2013-05-16: qty 60

## 2013-05-16 MED ORDER — SODIUM CHLORIDE 0.9 % IV SOLN
INTRAVENOUS | Status: DC
Start: 2013-05-16 — End: 2013-05-16
  Administered 2013-05-16: 50 mL/h via INTRAVENOUS

## 2013-05-16 MED ORDER — LIDOCAINE HCL (PF) 1 % IJ SOLN
INTRAMUSCULAR | Status: AC
  Filled 2013-05-16: qty 30

## 2013-05-16 MED ORDER — SODIUM CHLORIDE 0.9 % IV SOLN: 250.0000 mL | INTRAVENOUS | Status: DC | PRN

## 2013-05-16 MED ORDER — MUPIROCIN 2 % EX OINT: TOPICAL_OINTMENT | Freq: Two times a day (BID) | CUTANEOUS | Status: DC

## 2013-05-16 MED ORDER — ONDANSETRON HCL 4 MG/2ML IJ SOLN: 4.0000 mg | Freq: Four times a day (QID) | INTRAMUSCULAR | Status: DC | PRN

## 2013-05-16 MED ORDER — MIDAZOLAM HCL 5 MG/5ML IJ SOLN
INTRAMUSCULAR | Status: AC
  Filled 2013-05-16: qty 5

## 2013-05-16 MED ORDER — MUPIROCIN 2 % EX OINT
TOPICAL_OINTMENT | CUTANEOUS | Status: AC
  Filled 2013-05-16: qty 22

## 2013-05-16 MED ORDER — SODIUM CHLORIDE 0.9 % IJ SOLN
3.0000 mL | Freq: Two times a day (BID) | INTRAMUSCULAR | Status: DC
Start: 2013-05-16 — End: 2013-05-16

## 2013-05-16 NOTE — Op Note (Signed)
SURGEON:  Thompson Grayer, MD      PREPROCEDURE DIAGNOSES:   1. Ischemic cardiomyopathy.   2. New York Heart Association class III, heart failure chronically.   3. CAD  4. ICD malfunction     POSTPROCEDURE DIAGNOSES:   1. Ischemic cardiomyopathy.   2. New [place] Heart Association class III, heart failure chronically.   3. CAD  4. ICD malfunction   PROCEDURES:    1. ICD lead evaluation under fluoroscopy.  2. ICD pulse generator replacement with DFT testing  3. Skin pocket revision     INTRODUCTION:  COURTENAY HIRTH is a 53 y.o. female with an ischemic CM (EF 25%), NYHA Class III CHF, CAD, s/p ICD implantation for primary prevention of sudden death who presents today for ICD pulse generator replacement for device malfunction.  She has never received therapy from her device which was implanted 05/11/2009. She recently presented to our office complaining of a vibratory alert from her device.  This revealed that capacitors could not change at that the device was malfunctioning.  The patient therefore  presents today for ICD pulse generator replacement.      DESCRIPTION OF PROCEDURE:  Informed written consent was obtained and the patient was brought to the electrophysiology lab in the fasting state.  Her previously implanted SJM 7122-65 Durata lead was thoroughly evaluated with Cine in the AP position (5 minutes required).  There was no evidence for wire extrusion from the lead.  The lead was satisfactory and stable.  The device was interrogated and confirmed to be malfunctioning with capacitors still not able to charge.  Per S[employer] (rep Windle Guard) the recommendation from their company is device replacement.    The patient was adequately sedated with intravenous Versed, and fentanyl as outlined in the nursing report.  The patient's left chest was prepped and draped in the usual sterile fashion by the EP lab staff.  The skin overlying the left deltopectoral region was infiltrated with lidocaine for local  analgesia.  A 5-cm incision was made over the existing ICD pocket.  Electrocautery was used to assure hemostasis.  The device was exposed and removed from the pocket.  The device was disconnected from the lead.  The lead was examined thoroughly and its integrity confirmed to be intact.  The right ventricular lead was confirmed to be a Patterson, [phone] (serial number [id number]) right ventricular defibrillator lead implanted 05/11/2009.  The right ventricular lead R-wave measured 21 mV with impedance of 595 ohms and a threshold of 0.8 volts at 0.5 milliseconds.  The lead was then connected to a [place] [id number] ICD. The pocket was revised to accomodate this new device.  The device was placed more medially in the pocket.  The pocket was  irrigated with copious gentamicin solution.  The defibrillator was placed into the  Pocket and secured to the pectoralis fascia using #2 silk suture.  The pocket was then closed in 2 layers with 2.0 Vicryl suture  for the subcutaneous and subcuticular layers.  Steri-Strips and a  sterile dressing were then applied.   DFT Testing: Defibrillation Threshold testing was then performed. Ventricular fibrillation was induced with a 30 Hz stimulus.  Adequate sensing of ventricular  fibrillation was observed with no dropout with a programmed sensitivity of 1.45mV.  The patient was successfully defibrillated to sinus rhythm with a single 15 joules shock delivered from the device with an impedance of 85 ohms in a duration of 5  seconds.  The patient remained in sinus rhythm thereafter.  There were no early apparent complications.     CONCLUSIONS:   1. Ischemic cardiomyopathy with chronic New York Heart Association class III heart failure, CAD   2. ICD malfunction requiring generator change today  3. Successful ICD pulse generator replacement   4. DFT less than or equal to 15 joules.   5. No early apparent complications.   Jeneen Rinks  Keshonna Valvo,MD

## 2013-05-16 NOTE — Interval H&P Note (Signed)
History and Physical Interval Note:  05/16/2013 11:16 AM  Emily Mays  has presented today for surgery, with the diagnosis of END OF LIFE  The various methods of treatment have been discussed with the patient and family. After consideration of risks, benefits and other options for treatment, the patient has consented to  Procedure(s): IMPLANTABLE CARDIOVERTER DEFIBRILLATOR GENERATOR CHANGE (N/A) as a surgical intervention .  The patient's history has been reviewed, patient  examined, no change in status, stable for surgery.  I have reviewed the patient's chart and labs.  Questions were answered to the patient's satisfaction.    ICD Criteria  Current LVEF (within 6 months):25%  NYHA Functional Classification: Class III  Heart Failure History:  Yes, Duration of heart failure since onset is > 9 months  Non-Ischemic Dilated Cardiomyopathy History:  No.  Atrial Fibrillation/Atrial Flutter:  No.  Ventricular Tachycardia History:  No.  Cardiac Arrest History:  No  History of Syndromes with Risk of Sudden Death:  No.  Previous ICD:  Yes, ICD Type:  Single, Reason for ICD:  Primary prevention.  35%  Electrophysiology Study: No.  Prior MI: Yes, Most recent MI timeframe is > 40 days.  PPM: No.  OSA:  No  Patient Life Expectancy of >=1 year: Yes.  Anticoagulation Therapy:  Patient is NOT on anticoagulation therapy.   Beta Blocker Therapy:  Yes.   Ace Inhibitor/ARB Therapy:  Yes.     Thompson Grayer

## 2013-05-16 NOTE — Progress Notes (Signed)
BRIAN IN CATH LAB NOTIFIED H&P NEEDS TO BE DONE AND B-MET RESULTS NOT BACK

## 2013-05-16 NOTE — H&P (View-Only) (Signed)
PCP: Bonnita Nasuti, MD Primary Cardiologist: Gerrit Halls, MD  Emily Mays is a 53 y.o. female who presents today for electrophysiology followup.  Her ICD has been vibrating and she therefore presents to our office.  Since last being seen in our clinic, the patient reports doing very well.  Her SOB is stable.  She has occasional dizziness.  Today, she denies symptoms of palpitations, chest pain,  lower extremity edema, presyncope, syncope, or ICD shocks.  The patient is otherwise without complaint today.   Past Medical History  Diagnosis Date  . Anemia   . Other specified forms of chronic ischemic heart disease     echo 07/26/2008 EfF 30% with perapical HK  . Hypokalemia   . Unspecified essential hypertension   . Esophageal reflux   . Paralytic ileus   . CAD (coronary artery disease)     spontaneous LAD dissection presenting with anterior STEMI. s/p MI 07/24/08 with PCI Endeavor DES to Prox. LAD and BMA to prox. LCX.. relook cath 07/28/08 showing cont. patency of LAD to LCX.  Marland Kitchen Chronic systolic HF (heart failure)   . DDD (degenerative disc disease)   . GERD (gastroesophageal reflux disease)    Past Surgical History  Procedure Laterality Date  . Cholecystectomy      27 years ago  . Cardiac catheterization  March 2010    2 cardiac stent placement in March 2010.   . Cardiac defibrillator placement  december 2010    [place]  . Abdominal hysterectomy    . Bilateral salpingoophorectomy    . Total abdominal hysterectomy w/ bilateral salpingoophorectomy    . Left pelvic lymphadenectomy      by Dr. Marti Sleigh     No current facility-administered medications for this visit.   No current outpatient prescriptions on file.   Facility-Administered Medications Ordered in Other Visits  Medication Dose Route Frequency Provider Last Rate Last Dose  . 0.9 %  sodium chloride infusion   Intravenous Continuous Thompson Grayer, MD 50 mL/hr at 05/16/13 1103 50 mL/hr at 05/16/13 1103  .  chlorhexidine (HIBICLENS) 4 % liquid 4 application  60 mL Topical Once Thompson Grayer, MD      . gentamicin (GARAMYCIN) 80 mg in sodium chloride irrigation 0.9 % 500 mL irrigation  80 mg Irrigation On Call Thompson Grayer, MD      . mupirocin ointment (BACTROBAN) 2 %   Nasal BID Thompson Grayer, MD      . mupirocin ointment (BACTROBAN) 2 %           . vancomycin (VANCOCIN) IVPB 1000 mg/200 mL premix  1,000 mg Intravenous On Call Thompson Grayer, MD        Physical Exam: There were no vitals filed for this visit.  GEN- The patient is well appearing, alert and oriented x 3 today.   Head- normocephalic, atraumatic Eyes-  Sclera clear, conjunctiva pink Ears- hearing intact Oropharynx- clear Lungs- Clear to ausculation bilaterally, normal work of breathing Chest- ICD pocket is well healed Heart- Regular rate and rhythm, no murmurs, rubs or gallops, PMI not laterally displaced GI- soft, NT, ND, + BS Extremities- no clubbing, cyanosis, or edema  ICD interrogation- reviewed in detail today,  See PACEART report  Assessment and Plan:  1. Ischemic CM ICD interrogation today reveals that the capacity will not charge and this has set off the vibratory alert.  The device integrety appears compromised.  I have spoken with [employer] representations who have spoken with their engineers.  This is considered device failure and device replacement is therefore required.   Risks, benefits, and alternatives to device generator replacement were discussed at length with the patient who wishes to proceed.  We will schedule generator change at the next available time.  2. HTN Stable No change required today   

## 2013-05-16 NOTE — Progress Notes (Signed)
ICD check in clinic. Capacitor charge time unable to read. Pt scheduled for replacement on 05-16-13.

## 2013-05-16 NOTE — Discharge Instructions (Signed)
Pacemaker Battery Change A pacemaker battery usually lasts 4 to 12 years. Once or twice per year, you will be asked to visit your health care provider to have a full evaluation of your pacemaker. When a battery needs to be replaced, the entire pacemaker is replaced so that you can benefit from new circuitry and any new features that have been added to pacemakers. Most often, this procedure is very simple because the leads are already in place.  There are many things that affect how long a pacemaker battery will last, including:   The age of the pacemaker.   The number of leads (1, 2, or 3).   The pacemaker work load. If the pacemaker is helping the heart more often, the battery will not last as long as it would if the pacemaker did not need to help the heart.   Power (voltage) settings. LET [facility] CARE PROVIDER KNOW ABOUT:   Any allergies you have.   All medicines you are taking, including vitamins, herbs, eye drops, creams, and over-the-counter medicines.   Previous problems you or members of your family have had with the use of anesthetics.   Any blood disorders you have.   Previous surgeries you have had, especially since your last pacemaker placement.   Medical conditions you have.   Possible pregnancy, if applicable.  Symptoms of chest pain, trouble breathing, palpitations, lightheadedness, or feelings of an abnormal or irregular heartbeat. RISKS AND COMPLICATIONS  Generally, this is a safe procedure. However, as with any procedure, complications can occur. Possible complications include:   Bleeding.   Bruising of the skin around where the incision was made.   Pain at the incision site.   Pulling apart of the skin at the incision site.   Infection.   Allergic reaction to anesthetics or other medicines used during the procedure.  Diabetics may have a temporary increase in their blood sugar after any surgical procedure.  BEFORE THE PROCEDURE   Wash  all of the skin around the area of the chest where the pacemaker is located.   Ask your health care provider for help with any medicine adjustments before the pacemaker is replaced.   Unless advised otherwise, do not eat or drink after midnight on the night before the procedure. You may drink water to take your medicine as you normally would or as directed. PROCEDURE   After giving medicine to numb the skin, your health care provider will make a cut to reopen the pocket holding the pacemaker.   The old pacemaker will be disconnected from its leads.   The leads will be tested.   If needed, the leads will be replaced. If the leads are functioning properly, the new pacemaker may be connected to the existing leads.  A heart monitor and the pacemaker programmer will be used to make sure that the new pacemaker is working properly.  The incision site is then closed. A dressing is placed over the pacemaker site. The dressing is removed 24 to 48 hours afterwards. AFTER THE PROCEDURE   You will be taken to a recovery area after the new pacemaker implant is completed. Your vital signs such as blood pressure, heart rate, breathing, and oxygen levels will be monitored.  Your health care provider will tell you when you will need to next test your pacemaker or when to return to the office for follow-up for removal of stitches. Document Released: 08/09/2006 Document Revised: 01/01/2013 Document Reviewed: 11/13/2012 [facility] Patient Information 2014 [place].

## 2013-05-16 NOTE — Progress Notes (Signed)
PCP: Bonnita Nasuti, MD Primary Cardiologist: Gerrit Halls, MD  Emily Mays is a 53 y.o. female who presents today for electrophysiology followup.  Her ICD has been vibrating and she therefore presents to our office.  Since last being seen in our clinic, the patient reports doing very well.  Her SOB is stable.  She has occasional dizziness.  Today, she denies symptoms of palpitations, chest pain,  lower extremity edema, presyncope, syncope, or ICD shocks.  The patient is otherwise without complaint today.   Past Medical History  Diagnosis Date  . Anemia   . Other specified forms of chronic ischemic heart disease     echo 07/26/2008 EfF 30% with perapical HK  . Hypokalemia   . Unspecified essential hypertension   . Esophageal reflux   . Paralytic ileus   . CAD (coronary artery disease)     spontaneous LAD dissection presenting with anterior STEMI. s/p MI 07/24/08 with PCI Endeavor DES to Prox. LAD and BMA to prox. LCX.. relook cath 07/28/08 showing cont. patency of LAD to LCX.  Marland Kitchen Chronic systolic HF (heart failure)   . DDD (degenerative disc disease)   . GERD (gastroesophageal reflux disease)    Past Surgical History  Procedure Laterality Date  . Cholecystectomy      27 years ago  . Cardiac catheterization  March 2010    2 cardiac stent placement in March 2010.   . Cardiac defibrillator placement  december 2010    [place]  . Abdominal hysterectomy    . Bilateral salpingoophorectomy    . Total abdominal hysterectomy w/ bilateral salpingoophorectomy    . Left pelvic lymphadenectomy      by Dr. Marti Sleigh     No current facility-administered medications for this visit.   No current outpatient prescriptions on file.   Facility-Administered Medications Ordered in Other Visits  Medication Dose Route Frequency Provider Last Rate Last Dose  . 0.9 %  sodium chloride infusion   Intravenous Continuous Thompson Grayer, MD 50 mL/hr at 05/16/13 1103 50 mL/hr at 05/16/13 1103  .  chlorhexidine (HIBICLENS) 4 % liquid 4 application  60 mL Topical Once Thompson Grayer, MD      . gentamicin (GARAMYCIN) 80 mg in sodium chloride irrigation 0.9 % 500 mL irrigation  80 mg Irrigation On Call Thompson Grayer, MD      . mupirocin ointment (BACTROBAN) 2 %   Nasal BID Thompson Grayer, MD      . mupirocin ointment (BACTROBAN) 2 %           . vancomycin (VANCOCIN) IVPB 1000 mg/200 mL premix  1,000 mg Intravenous On Call Thompson Grayer, MD        Physical Exam: There were no vitals filed for this visit.  GEN- The patient is well appearing, alert and oriented x 3 today.   Head- normocephalic, atraumatic Eyes-  Sclera clear, conjunctiva pink Ears- hearing intact Oropharynx- clear Lungs- Clear to ausculation bilaterally, normal work of breathing Chest- ICD pocket is well healed Heart- Regular rate and rhythm, no murmurs, rubs or gallops, PMI not laterally displaced GI- soft, NT, ND, + BS Extremities- no clubbing, cyanosis, or edema  ICD interrogation- reviewed in detail today,  See PACEART report  Assessment and Plan:  1. Ischemic CM ICD interrogation today reveals that the capacity will not charge and this has set off the vibratory alert.  The device integrety appears compromised.  I have spoken with [employer] representations who have spoken with their engineers.  This is considered device failure and device replacement is therefore required.   Risks, benefits, and alternatives to device generator replacement were discussed at length with the patient who wishes to proceed.  We will schedule generator change at the next available time.  2. HTN Stable No change required today

## 2013-05-17 ENCOUNTER — Telehealth: Payer: Self-pay | Admitting: Physician Assistant

## 2013-05-17 NOTE — Telephone Encounter (Signed)
Pt called answering service on Saturday. Had defib change-out yesterday. She wasn't sure when to take off the dressing - says one person told her yesterday, another person told her 24 hrs. Procedure was around noon. She woke up this morning and there was a small dot of blood that came through the dressing but none since. The area feels sore but no swelling. I advised she keep the dressing on a few more hours and remove this evening, and let us know if she observes for any more bleeding, swelling or redness. She is to leave Steristrips on until they fall off. She verbalized understanding. Dayna Dunn PA-C

## 2013-05-26 ENCOUNTER — Other Ambulatory Visit: Payer: Self-pay | Admitting: *Deleted

## 2013-05-26 ENCOUNTER — Ambulatory Visit (INDEPENDENT_AMBULATORY_CARE_PROVIDER_SITE_OTHER): Payer: Managed Care, Other (non HMO) | Admitting: *Deleted

## 2013-05-26 DIAGNOSIS — I2589 Other forms of chronic ischemic heart disease: Secondary | ICD-10-CM

## 2013-05-26 LAB — MDC_IDC_ENUM_SESS_TYPE_INCLINIC
Battery Remaining Longevity: 103.2 mo
Brady Statistic RV Percent Paced: 0 %
Date Time Interrogation Session: 20150112121618
HighPow Impedance: 55 Ohm
HighPow Impedance: 55.125
Implantable Pulse Generator Serial Number: 7152745
Lead Channel Pacing Threshold Amplitude: 1 V
Lead Channel Pacing Threshold Pulse Width: 0.5 ms
Lead Channel Setting Pacing Amplitude: 2.5 V
Lead Channel Setting Pacing Pulse Width: 0.5 ms
MDC IDC MSMT LEADCHNL RV IMPEDANCE VALUE: 462.5 Ohm
MDC IDC MSMT LEADCHNL RV SENSING INTR AMPL: 12 mV
MDC IDC SET LEADCHNL RV SENSING SENSITIVITY: 0.5 mV
Zone Setting Detection Interval: 300 ms
Zone Setting Detection Interval: 350 ms

## 2013-05-26 MED ORDER — VALSARTAN 40 MG PO TABS: 40.0000 mg | ORAL_TABLET | Freq: Every day | ORAL | Status: DC

## 2013-05-26 MED ORDER — CLOPIDOGREL BISULFATE 75 MG PO TABS: 75.0000 mg | ORAL_TABLET | Freq: Every day | ORAL | Status: DC

## 2013-05-26 NOTE — Progress Notes (Signed)
ormal device function. Thresholds, sensing, and impedances consistent with implant measurements.  Histogram distribution appropriate for patient and level of activity. No mode switches or ventricular arrhythmias noted. Patient educated about wound care, arm mobility, lifting restrictions, shock plan. ROV in 3 months with implanting physician.

## 2013-05-29 ENCOUNTER — Encounter (HOSPITAL_COMMUNITY): Payer: Self-pay | Admitting: Certified Registered"

## 2013-05-30 ENCOUNTER — Encounter: Payer: Self-pay | Admitting: Internal Medicine

## 2013-06-12 ENCOUNTER — Encounter: Payer: Self-pay | Admitting: Internal Medicine

## 2013-06-14 ENCOUNTER — Other Ambulatory Visit: Payer: Self-pay | Admitting: Cardiovascular Disease

## 2013-06-26 ENCOUNTER — Other Ambulatory Visit: Payer: Self-pay

## 2013-06-26 MED ORDER — CARVEDILOL 25 MG PO TABS: 25.0000 mg | ORAL_TABLET | Freq: Two times a day (BID) | ORAL | Status: DC

## 2013-06-26 MED ORDER — DIGOXIN 250 MCG PO TABS: 0.1250 mg | ORAL_TABLET | Freq: Every day | ORAL | Status: DC

## 2013-07-01 ENCOUNTER — Other Ambulatory Visit: Payer: Self-pay

## 2013-07-01 MED ORDER — DIGOXIN 250 MCG PO TABS: 0.1250 mg | ORAL_TABLET | Freq: Every day | ORAL | Status: DC

## 2013-07-01 MED ORDER — CARVEDILOL 25 MG PO TABS: 25.0000 mg | ORAL_TABLET | Freq: Two times a day (BID) | ORAL | Status: DC

## 2013-07-04 ENCOUNTER — Ambulatory Visit (HOSPITAL_COMMUNITY): Payer: Managed Care, Other (non HMO) | Attending: Cardiovascular Disease | Admitting: Radiology

## 2013-07-04 ENCOUNTER — Encounter (INDEPENDENT_AMBULATORY_CARE_PROVIDER_SITE_OTHER): Payer: Self-pay

## 2013-07-04 ENCOUNTER — Ambulatory Visit (INDEPENDENT_AMBULATORY_CARE_PROVIDER_SITE_OTHER): Payer: Managed Care, Other (non HMO) | Admitting: Cardiovascular Disease

## 2013-07-04 ENCOUNTER — Encounter: Payer: Self-pay | Admitting: Cardiovascular Disease

## 2013-07-04 VITALS — BP 110/60 | HR 84 | Ht 64.0 in | Wt 166.8 lb

## 2013-07-04 DIAGNOSIS — R0602 Shortness of breath: Secondary | ICD-10-CM

## 2013-07-04 DIAGNOSIS — I2589 Other forms of chronic ischemic heart disease: Secondary | ICD-10-CM | POA: Insufficient documentation

## 2013-07-04 DIAGNOSIS — R0989 Other specified symptoms and signs involving the circulatory and respiratory systems: Secondary | ICD-10-CM | POA: Insufficient documentation

## 2013-07-04 DIAGNOSIS — I5022 Chronic systolic (congestive) heart failure: Secondary | ICD-10-CM

## 2013-07-04 DIAGNOSIS — I509 Heart failure, unspecified: Secondary | ICD-10-CM | POA: Insufficient documentation

## 2013-07-04 DIAGNOSIS — I2510 Atherosclerotic heart disease of native coronary artery without angina pectoris: Secondary | ICD-10-CM | POA: Insufficient documentation

## 2013-07-04 DIAGNOSIS — R0609 Other forms of dyspnea: Secondary | ICD-10-CM | POA: Insufficient documentation

## 2013-07-04 NOTE — Progress Notes (Signed)
Echocardiogram Performed. 

## 2013-07-04 NOTE — Patient Instructions (Addendum)
Your physician recommends that you continue on your current medications as directed. Please refer to the Current Medication list given to you today.  Your physician wants you to follow-up in: 1 Year with Dr Cooper You will receive a reminder letter in the mail two months in advance. If you don't receive a letter, please call our office to schedule the follow-up appointment.  

## 2013-07-04 NOTE — Progress Notes (Signed)
HPI:   53 year old woman presenting for followup evaluation. She is followed for coronary artery disease and presented in 2010 with a spontaneous dissection of the LAD. This required extensive stenting of the LAD and left circumflex. She dissected back to the left main. She went on to develop a severe ischemic cardiomyopathy and was treated with an ICD for primary prevention of sudden cardiac death.  The patient has been doing well. She underwent an ICD generator change in January of this year. She denies chest pain, chest pressure, edema, or palpitations. She's had no lightheadedness or syncope. She has chronic dyspnea with moderate level of exertion. This is unchanged over time. She denies orthopnea or PND.  Outpatient Encounter Prescriptions as of 07/04/2013  Medication Sig  . ALPRAZolam (XANAX) 0.5 MG tablet Take 0.5 mg by mouth at bedtime as needed.   Marland Kitchen aspirin EC 81 MG tablet Take 81 mg by mouth daily.  . Canagliflozin (INVOKANA) 100 MG TABS Take 100 mg by mouth daily.  . carvedilol (COREG) 25 MG tablet Take 1 tablet (25 mg total) by mouth 2 (two) times daily with a meal.  . clopidogrel (PLAVIX) 75 MG tablet Take 1 tablet (75 mg total) by mouth at bedtime.  . digoxin (LANOXIN) 0.25 MG tablet Take 0.5 tablets (0.125 mg total) by mouth daily.  . furosemide (LASIX) 20 MG tablet Take 20 mg by mouth daily.  Marland Kitchen glipiZIDE (GLUCOTROL) 10 MG tablet Take 10 mg by mouth daily.   Marland Kitchen LUZU 1 % CREA   . nitroGLYCERIN (NITROSTAT) 0.4 MG SL tablet Place 0.4 mg under the tongue every 5 (five) minutes as needed.  Marland Kitchen oxyCODONE-acetaminophen (PERCOCET) 5-325 MG per tablet Take 1 tablet by mouth every 4 (four) hours as needed for moderate pain or severe pain.   . pantoprazole (PROTONIX) 40 MG tablet Take 40 mg by mouth daily.   . simvastatin (ZOCOR) 40 MG tablet Take 40 mg by mouth daily.   . sitaGLIPtan-metformin (JANUMET) 50-1000 MG per tablet Take 1 tablet by mouth 2 (two) times daily with a meal.   .  spironolactone (ALDACTONE) 25 MG tablet Take 12.5 mg by mouth daily.  Marland Kitchen terbinafine (LAMISIL) 250 MG tablet Take 250 mg by mouth daily.  . valsartan (DIOVAN) 40 MG tablet Take 1 tablet (40 mg total) by mouth daily.    Allergies  Allergen Reactions  . Duloxetine Hcl     Made heart race  . Penicillins Hives    Past Medical History  Diagnosis Date  . Anemia   . Other specified forms of chronic ischemic heart disease     echo 07/26/2008 EfF 30% with perapical HK  . Hypokalemia   . Unspecified essential hypertension   . Esophageal reflux   . Paralytic ileus   . CAD (coronary artery disease)     spontaneous LAD dissection presenting with anterior STEMI. s/p MI 07/24/08 with PCI Endeavor DES to Prox. LAD and BMA to prox. LCX.. relook cath 07/28/08 showing cont. patency of LAD to LCX.  Marland Kitchen Chronic systolic HF (heart failure)   . DDD (degenerative disc disease)   . GERD (gastroesophageal reflux disease)    ROS: Negative except as per HPI  BP 110/60  Pulse 84  Ht 5\' 4"  (1.626 m)  Wt 166 lb 12.8 oz (75.66 kg)  BMI 28.62 kg/m2  SpO2 98%  PHYSICAL EXAM: Pt is alert and oriented, NAD HEENT: normal Neck: JVP - normal, carotids 2+= without bruits Lungs: CTA bilaterally CV: RRR without murmur  or gallop Abd: soft, NT, Positive BS, no hepatomegaly Ext: no C/C/E, distal pulses intact and equal Skin: warm/dry no rash  2D Echo 12/05/2012: Study Conclusions  - Left ventricle: Septal and apical akinesis Severe inferior wall hypokinesis The cavity size was severely dilated. Wall thickness was normal. Systolic function was severely reduced. The estimated ejection fraction was in the range of 25% to 30%. - Left atrium: The atrium was mildly dilated. - Atrial septum: No defect or patent foramen ovale was identified. - Pulmonary arteries: PA peak pressure: 23mm Hg (S).  ASSESSMENT AND PLAN: 1. Chronic systolic heart failure with severe ischemic cardiomyopathy. She is stable with [place]  Heart Association class II symptoms. Her medical program is appropriate. This includes carvedilol, digoxin, furosemide, valsartan, and Aldactone. She had a 2-D echocardiogram this morning with the results currently pending.  2. CAD, native vessel. Stable without anginal symptoms. She remains on dual antiplatelet therapy. Her medications are appropriate.   3. Ischemic cardiomyopathy status post ICD implantation. Followed by Dr. Rayann Heman. Underwent ICD generator change 05/16/2013.  Sherren Mocha 07/04/2013 11:30 AM

## 2013-07-14 ENCOUNTER — Telehealth: Payer: Self-pay | Admitting: Cardiovascular Disease

## 2013-07-14 DIAGNOSIS — I519 Heart disease, unspecified: Secondary | ICD-10-CM

## 2013-07-14 NOTE — Telephone Encounter (Signed)
New message  ° ° °Patient calling for test results.   °

## 2013-07-17 NOTE — Telephone Encounter (Signed)
Pt called today for echo results.  Informed Dr. Burt Knack has not reviewed yet.  She would like a call on her mobile number with results when available.

## 2013-07-17 NOTE — Telephone Encounter (Signed)
Results reviewed with patient by Dr. Burt Knack via telephone.  Additional study ordered - 2 D Echo with contrast, limited per Dr. Burt Knack and patient aware that she will receive a call to schedule.  Patient prefers Tuesday or Friday appointment

## 2013-07-22 ENCOUNTER — Encounter: Payer: Self-pay | Admitting: Cardiology

## 2013-07-22 ENCOUNTER — Ambulatory Visit (HOSPITAL_COMMUNITY)
Admission: RE | Admit: 2013-07-22 | Discharge: 2013-07-22 | Disposition: A | Discharge status: Home / Self Care | Payer: Managed Care, Other (non HMO) | Source: Ambulatory Visit | Attending: Cardiovascular Disease | Admitting: Cardiovascular Disease

## 2013-07-22 ENCOUNTER — Ambulatory Visit (HOSPITAL_BASED_OUTPATIENT_CLINIC_OR_DEPARTMENT_OTHER): Payer: Managed Care, Other (non HMO) | Admitting: Radiology

## 2013-07-22 DIAGNOSIS — I2510 Atherosclerotic heart disease of native coronary artery without angina pectoris: Secondary | ICD-10-CM | POA: Insufficient documentation

## 2013-07-22 DIAGNOSIS — I2589 Other forms of chronic ischemic heart disease: Secondary | ICD-10-CM | POA: Insufficient documentation

## 2013-07-22 DIAGNOSIS — I519 Heart disease, unspecified: Secondary | ICD-10-CM

## 2013-07-22 MED ORDER — PERFLUTREN PROTEIN A MICROSPH IV SUSP
3.0000 mL | Freq: Once | INTRAVENOUS | Status: AC
  Administered 2013-07-22: 3 mL via INTRAVENOUS

## 2013-07-22 NOTE — Progress Notes (Signed)
Limited Echocardiogram performed optison

## 2013-07-22 NOTE — Addendum Note (Signed)
Addended by: Gretta Began on: 07/22/2013 05:06 PM   Modules accepted: Orders

## 2013-08-28 ENCOUNTER — Encounter: Payer: Self-pay | Admitting: *Deleted

## 2013-09-01 NOTE — Telephone Encounter (Signed)
Please see Visit Info comments 

## 2013-09-03 ENCOUNTER — Encounter: Payer: Self-pay | Admitting: Internal Medicine

## 2013-09-03 ENCOUNTER — Ambulatory Visit (INDEPENDENT_AMBULATORY_CARE_PROVIDER_SITE_OTHER): Payer: Managed Care, Other (non HMO) | Admitting: Internal Medicine

## 2013-09-03 ENCOUNTER — Encounter: Payer: Managed Care, Other (non HMO) | Admitting: *Deleted

## 2013-09-03 VITALS — BP 102/67 | HR 98 | Ht 64.0 in | Wt 166.0 lb

## 2013-09-03 DIAGNOSIS — I2589 Other forms of chronic ischemic heart disease: Secondary | ICD-10-CM

## 2013-09-03 DIAGNOSIS — Z95810 Presence of automatic (implantable) cardiac defibrillator: Secondary | ICD-10-CM

## 2013-09-03 DIAGNOSIS — I10 Essential (primary) hypertension: Secondary | ICD-10-CM

## 2013-09-03 LAB — MDC_IDC_ENUM_SESS_TYPE_INCLINIC
Battery Remaining Longevity: 100.8 mo
Date Time Interrogation Session: 20150422160054
HighPow Impedance: 71 Ohm
Lead Channel Pacing Threshold Amplitude: 1 V
Lead Channel Pacing Threshold Amplitude: 1 V
Lead Channel Pacing Threshold Pulse Width: 0.5 ms
Lead Channel Sensing Intrinsic Amplitude: 12 mV
Lead Channel Setting Pacing Amplitude: 2.5 V
Lead Channel Setting Pacing Pulse Width: 0.5 ms
Lead Channel Setting Sensing Sensitivity: 0.5 mV
MDC IDC MSMT LEADCHNL RV IMPEDANCE VALUE: 612.5 Ohm
MDC IDC MSMT LEADCHNL RV PACING THRESHOLD PULSEWIDTH: 0.5 ms
MDC IDC PG SERIAL: 7152745
MDC IDC STAT BRADY RV PERCENT PACED: 0 %
Zone Setting Detection Interval: 300 ms
Zone Setting Detection Interval: 350 ms

## 2013-09-03 NOTE — Patient Instructions (Signed)
Your physician wants you to follow-up in: 12 months with Dr Vallery Ridge will receive a reminder letter in the mail two months in advance. If you don't receive a letter, please call our office to schedule the follow-up appointment.   Remote monitoring is used to monitor your Pacemaker or ICD from home. This monitoring reduces the number of office visits required to check your device to one time per year. It allows [place] to keep an eye on the functioning of your device to ensure it is working properly. You are scheduled for a device check from home on 12/04/13. You may send your transmission at any time that day. If you have a wireless device, the transmission will be sent automatically. After your physician reviews your transmission, you will receive a postcard with your next transmission date.  Will have Alvis Lemmings, RN cal you about ICM clinic

## 2013-09-03 NOTE — Progress Notes (Signed)
PCP: Bonnita Nasuti, MD Primary Cardiologist: Gerrit Halls, MD  Emily Mays is a 53 y.o. female who presents today for electrophysiology followup.  She has done well since her generator change.  Today, she denies symptoms of palpitations, chest pain,  lower extremity edema, presyncope, syncope, or ICD shocks.  The patient is otherwise without complaint today.   Past Medical History  Diagnosis Date  . Anemia   . Other specified forms of chronic ischemic heart disease     echo 07/26/2008 EfF 30% with perapical HK  . Hypokalemia   . Unspecified essential hypertension   . Esophageal reflux   . Paralytic ileus   . CAD (coronary artery disease)     spontaneous LAD dissection presenting with anterior STEMI. s/p MI 07/24/08 with PCI Endeavor DES to Prox. LAD and BMA to prox. LCX.. relook cath 07/28/08 showing cont. patency of LAD to LCX.  Marland Kitchen Chronic systolic HF (heart failure)   . DDD (degenerative disc disease)   . GERD (gastroesophageal reflux disease)    Past Surgical History  Procedure Laterality Date  . Cholecystectomy      27 years ago  . Cardiac catheterization  March 2010    2 cardiac stent placement in March 2010.   . Cardiac defibrillator placement  december 2010    [place]  . Abdominal hysterectomy    . Bilateral salpingoophorectomy    . Total abdominal hysterectomy w/ bilateral salpingoophorectomy    . Left pelvic lymphadenectomy      by Dr. Marti Sleigh     Current Outpatient Prescriptions  Medication Sig Dispense Refill  . ALPRAZolam (XANAX) 0.5 MG tablet Take 0.5 mg by mouth at bedtime as needed.       Marland Kitchen aspirin EC 81 MG tablet Take 81 mg by mouth daily.      . Canagliflozin (INVOKANA) 100 MG TABS Take 100 mg by mouth daily.      . carvedilol (COREG) 25 MG tablet Take 1 tablet (25 mg total) by mouth 2 (two) times daily with a meal.  60 tablet  6  . clopidogrel (PLAVIX) 75 MG tablet Take 1 tablet (75 mg total) by mouth at bedtime.  90 tablet  3  . digoxin  (LANOXIN) 0.25 MG tablet Take 0.5 tablets (0.125 mg total) by mouth daily.  30 tablet  6  . furosemide (LASIX) 20 MG tablet Take 20 mg by mouth daily.      Marland Kitchen glipiZIDE (GLUCOTROL) 10 MG tablet Take 10 mg by mouth daily.       Marland Kitchen LUZU 1 % CREA Apply topically as needed.       . nitroGLYCERIN (NITROSTAT) 0.4 MG SL tablet Place 0.4 mg under the tongue every 5 (five) minutes as needed.      Marland Kitchen oxyCODONE-acetaminophen (PERCOCET) 5-325 MG per tablet Take 1 tablet by mouth every 4 (four) hours as needed for moderate pain or severe pain.       . pantoprazole (PROTONIX) 40 MG tablet Take 40 mg by mouth daily.       . simvastatin (ZOCOR) 40 MG tablet Take 40 mg by mouth daily.       . sitaGLIPtan-metformin (JANUMET) 50-1000 MG per tablet Take 1 tablet by mouth 2 (two) times daily with a meal.       . spironolactone (ALDACTONE) 25 MG tablet Take 12.5 mg by mouth daily.      . valsartan (DIOVAN) 40 MG tablet Take 1 tablet (40 mg total) by mouth daily.  90 tablet  3   No current facility-administered medications for this visit.    Physical Exam: Filed Vitals:   09/03/13 1541  BP: 102/67  Pulse: 98  Height: 5\' 4"  (1.626 m)  Weight: 166 lb (75.297 kg)    GEN- The patient is well appearing, alert and oriented x 3 today.   Head- normocephalic, atraumatic Eyes-  Sclera clear, conjunctiva pink Ears- hearing intact Oropharynx- clear Lungs- Clear to ausculation bilaterally, normal work of breathing Chest- ICD pocket is well healed Heart- Regular rate and rhythm, no murmurs, rubs or gallops, PMI not laterally displaced GI- soft, NT, ND, + BS Extremities- no clubbing, cyanosis, or edema  ICD interrogation- reviewed in detail today,  See PACEART report  Assessment and Plan:  1. Ischemic CM Normal ICD function See Pace Art report No changes today Merlin Will enroll in [facility] clinic with Alvis Lemmings   2. HTN Stable No change required today  Return to see me in 1year merlin

## 2013-09-11 ENCOUNTER — Encounter: Payer: Self-pay | Admitting: *Deleted

## 2013-09-18 ENCOUNTER — Other Ambulatory Visit: Payer: Self-pay

## 2013-09-18 MED ORDER — FUROSEMIDE 20 MG PO TABS: 20.0000 mg | ORAL_TABLET | Freq: Every day | ORAL | Status: DC

## 2013-10-09 ENCOUNTER — Encounter: Payer: Self-pay | Admitting: *Deleted

## 2013-10-09 ENCOUNTER — Ambulatory Visit (INDEPENDENT_AMBULATORY_CARE_PROVIDER_SITE_OTHER): Payer: Managed Care, Other (non HMO) | Admitting: *Deleted

## 2013-10-09 DIAGNOSIS — I2589 Other forms of chronic ischemic heart disease: Secondary | ICD-10-CM

## 2013-10-09 DIAGNOSIS — Z95810 Presence of automatic (implantable) cardiac defibrillator: Secondary | ICD-10-CM

## 2013-10-09 NOTE — Progress Notes (Signed)
EPIC Encounter for ICM Monitoring  Patient Name: Emily Mays is a 53 y.o. female Date: 10/09/2013 Primary Care Physican: Bonnita Nasuti, MD Primary Cardiologist: Burt Knack Electrophysiologist: Allred Dry Weight: 168 lbs       In the past month, have you:  1. Gained more than 2 pounds in a day or more than 5 pounds in a week? no  2. Had changes in your medications (with verification of current medications)? no  3. Had more shortness of breath than is usual for you? no  4. Limited your activity because of shortness of breath? no  5. Not been able to sleep because of shortness of breath? no  6. Had increased swelling in your feet or ankles? no  7. Had symptoms of dehydration (dizziness, dry mouth, increased thirst, decreased urine output) no  8. Had changes in sodium restriction? Yes. She has been eating more pretzels lately.  9. Been compliant with medication? Yes   ICM trend:   Follow-up plan: ICM clinic phone appointment: 11/10/13  Copy of note sent to patient's primary care physician, primary cardiologist, and device following physician.  Emily Filbert, RN, BSN 10/09/2013 3:19 PM

## 2013-10-09 NOTE — Patient Instructions (Signed)
2 Gram Low Sodium Diet A 2 gram sodium diet restricts the amount of sodium in the diet to no more than 2 g or 2000 mg daily. Limiting the amount of sodium is often used to help lower blood pressure. It is important if you have heart, liver, or kidney problems. Many foods contain sodium for flavor and sometimes as a preservative. When the amount of sodium in a diet needs to be low, it is important to know what to look for when choosing foods and drinks. The following includes some information and guidelines to help make it easier for you to adapt to a low sodium diet. QUICK TIPS  Do not add salt to food.  Avoid convenience items and fast food.  Choose unsalted snack foods.  Buy lower sodium products, often labeled as "lower sodium" or "no salt added."  Check food labels to learn how much sodium is in 1 serving.  When eating at a restaurant, ask that your food be prepared with less salt or none, if possible. READING FOOD LABELS FOR SODIUM INFORMATION The nutrition facts label is a good place to find how much sodium is in foods. Look for products with no more than 500 to 600 mg of sodium per meal and no more than 150 mg per serving. Remember that 2 g = 2000 mg. The food label may also list foods as:  Sodium-free: Less than 5 mg in a serving.  Very low sodium: 35 mg or less in a serving.  Low-sodium: 140 mg or less in a serving.  Light in sodium: 50% less sodium in a serving. For example, if a food that usually has 300 mg of sodium is changed to become light in sodium, it will have 150 mg of sodium.  Reduced sodium: 25% less sodium in a serving. For example, if a food that usually has 400 mg of sodium is changed to reduced sodium, it will have 300 mg of sodium. CHOOSING FOODS Grains  Avoid: Salted crackers and snack items. Some cereals, including instant hot cereals. Bread stuffing and biscuit mixes. Seasoned rice or pasta mixes.  Choose: Unsalted snack items. Low-sodium cereals, oats,  puffed wheat and rice, shredded wheat. English muffins and bread. Pasta. Meats  Avoid: Salted, canned, smoked, spiced, pickled meats, including fish and poultry. Bacon, ham, sausage, cold cuts, hot dogs, anchovies.  Choose: Low-sodium canned tuna and salmon. Fresh or frozen meat, poultry, and fish. Dairy  Avoid: Processed cheese and spreads. Cottage cheese. Buttermilk and condensed milk. Regular cheese.  Choose: Milk. Low-sodium cottage cheese. Yogurt. Sour cream. Low-sodium cheese. Fruits and Vegetables  Avoid: Regular canned vegetables. Regular canned tomato sauce and paste. Frozen vegetables in sauces. Olives. Pickles. Relishes. Sauerkraut.  Choose: Low-sodium canned vegetables. Low-sodium tomato sauce and paste. Frozen or fresh vegetables. Fresh and frozen fruit. Condiments  Avoid: Canned and packaged gravies. Worcestershire sauce. Tartar sauce. Barbecue sauce. Soy sauce. Steak sauce. Ketchup. Onion, garlic, and table salt. Meat flavorings and tenderizers.  Choose: Fresh and dried herbs and spices. Low-sodium varieties of mustard and ketchup. Lemon juice. Tabasco sauce. Horseradish. SAMPLE 2 GRAM SODIUM MEAL PLAN Breakfast / Sodium (mg)  1 cup low-fat milk / 143 mg  2 slices whole-wheat toast / 270 mg  1 tbs heart-healthy margarine / 153 mg  1 hard-boiled egg / 139 mg  1 small orange / 0 mg Lunch / Sodium (mg)  1 cup raw carrots / 76 mg   cup hummus / 298 mg  1 cup low-fat milk /   143 mg   cup red grapes / 2 mg  1 whole-wheat pita bread / 356 mg Dinner / Sodium (mg)  1 cup whole-wheat pasta / 2 mg  1 cup low-sodium tomato sauce / 73 mg  3 oz lean ground beef / 57 mg  1 small side salad (1 cup raw spinach leaves,  cup cucumber,  cup yellow bell pepper) with 1 tsp olive oil and 1 tsp red wine vinegar / 25 mg Snack / Sodium (mg)  1 container low-fat vanilla yogurt / 107 mg  3 graham cracker squares / 127 mg Nutrient Analysis  Calories: 2033  Protein:  77 g  Carbohydrate: 282 g  Fat: 72 g  Sodium: 1971 mg Document Released: 05/01/2005 Document Revised: 07/24/2011 Document Reviewed: 08/02/2009 [employer] Patient Information 2014 [employer].  

## 2013-10-17 ENCOUNTER — Encounter: Payer: Managed Care, Other (non HMO) | Admitting: Internal Medicine

## 2013-11-10 ENCOUNTER — Ambulatory Visit (INDEPENDENT_AMBULATORY_CARE_PROVIDER_SITE_OTHER): Payer: Managed Care, Other (non HMO) | Admitting: *Deleted

## 2013-11-10 ENCOUNTER — Telehealth: Payer: Self-pay | Admitting: Cardiology

## 2013-11-10 DIAGNOSIS — I2589 Other forms of chronic ischemic heart disease: Secondary | ICD-10-CM

## 2013-11-10 DIAGNOSIS — Z95810 Presence of automatic (implantable) cardiac defibrillator: Secondary | ICD-10-CM

## 2013-11-10 NOTE — Telephone Encounter (Signed)
Spoke with pt and reminded pt of remote transmission that is due today. Pt verbalized understanding.   

## 2013-11-13 ENCOUNTER — Encounter: Payer: Self-pay | Admitting: *Deleted

## 2013-11-13 NOTE — Progress Notes (Signed)
EPIC Encounter for ICM Monitoring  Patient Name: Emily Mays is a 53 y.o. female Date: 11/13/2013 Primary Care Physican: Bonnita Nasuti, MD Primary Cardiologist: Burt Knack Electrophysiologist: Allred Dry Weight: 168 lbs       In the past month, have you:  1. Gained more than 2 pounds in a day or more than 5 pounds in a week? No. She saw her PCP recently and her weight was 166 lbs.  2. Had changes in your medications (with verification of current medications)? No. She did have labwork at her PCP's office and was told to start on B-12 injections every 2 weeks and to start Vitamin D 50000 units - she thinks this will be twice a week. She has not started these yet. Her HgbA1C was 11.2. No changes were made to there diabetic meds. She states she spoke with the nutritionist today and was given some things to try with her diet and she will do this first.  3. Had more shortness of breath than is usual for you? no  4. Limited your activity because of shortness of breath? no  5. Not been able to sleep because of shortness of breath? no  6. Had increased swelling in your feet or ankles? no  7. Had symptoms of dehydration (dizziness, dry mouth, increased thirst, decreased urine output) no  8. Had changes in sodium restriction? no  9. Been compliant with medication? Yes   ICM trend:   Follow-up plan: ICM clinic phone appointment: 12/15/13- full transmission  Copy of note sent to patient's primary care physician, primary cardiologist, and device following physician.  Alvis Lemmings, RN, BSN 11/13/2013 3:38 PM

## 2013-11-17 NOTE — Progress Notes (Signed)
I don't think she is seeing an endocrinologists. I had to route her note to Dr. Jannette Fogo (his name is spelled with a "q" instead of a "g". I keep forgetting to fix the default on the note. I did route this note to him and hope it went through. I asked her if he was going to change her diabetic medications and she stated not at this time. She states she spoke with a nutritionist and was given some diet info- she told me she wants to try to improve her A1C with her diet prior to medications changes. If I recall she is due to either have repeat labs in 3 months or have labs and a visit with Dr. Jannette Fogo in 3 months. Just let me know what you need me to do. Thanks

## 2013-12-15 ENCOUNTER — Ambulatory Visit (INDEPENDENT_AMBULATORY_CARE_PROVIDER_SITE_OTHER): Payer: Managed Care, Other (non HMO) | Admitting: *Deleted

## 2013-12-15 ENCOUNTER — Encounter: Payer: Self-pay | Admitting: *Deleted

## 2013-12-15 DIAGNOSIS — I2589 Other forms of chronic ischemic heart disease: Secondary | ICD-10-CM

## 2013-12-15 DIAGNOSIS — Z95810 Presence of automatic (implantable) cardiac defibrillator: Secondary | ICD-10-CM

## 2013-12-15 NOTE — Progress Notes (Signed)
EPIC Encounter for ICM Monitoring  Patient Name: Emily Mays is a 52 y.o. female Date: 12/15/2013 Primary Care Physican: Celedonio Miyamoto, MD  Primary Cardiologist: Burt Knack Electrophysiologist: Allred Dry Weight: 168 lbs       In the past month, have you:  1. Gained more than 2 pounds in a day or more than 5 pounds in a week? no  2. Had changes in your medications (with verification of current medications)? no  3. Had more shortness of breath than is usual for you? no  4. Limited your activity because of shortness of breath? no  5. Not been able to sleep because of shortness of breath? no  6. Had increased swelling in your feet or ankles? no  7. Had symptoms of dehydration (dizziness, dry mouth, increased thirst, decreased urine output) no  8. Had changes in sodium restriction? no  9. Been compliant with medication? Yes  ** The patient complains of some occasional dizziness when she turns over. BP's have been stable. She did have a HgB A1C done last month that was 11. Over the last couple of weeks, she has been trying very hard to control her diet. Her sugars have dropped from 500 to 158-192. I explained that she may be experiencing some of her side effects from the dramatic drop in her CBG's. She will notify her PCP if this worsens. **   ICM trend:   Follow-up plan: ICM clinic phone appointment: 01/15/14. The patient is very excited that she will going to the beach soon with her daughter. She states that she has not been anywhere since her heart attack 5 years ago. I have advised her to stay hydrated while outside and to avoid the hottest part of the day on the beach.   Copy of note sent to patient's primary care physician, primary cardiologist, and device following physician.  Alvis Lemmings, RN, BSN 12/15/2013 5:36 PM

## 2013-12-17 ENCOUNTER — Encounter: Payer: Self-pay | Admitting: Internal Medicine

## 2013-12-17 LAB — MDC_IDC_ENUM_SESS_TYPE_REMOTE
Lead Channel Setting Pacing Amplitude: 2.5 V
Lead Channel Setting Pacing Pulse Width: 0.5 ms
Lead Channel Setting Sensing Sensitivity: 0.5 mV
MDC IDC MSMT BATTERY REMAINING LONGEVITY: 99 mo
MDC IDC MSMT LEADCHNL RV SENSING INTR AMPL: 11.7 mV
MDC IDC PG SERIAL: 7152745
MDC IDC SET ZONE DETECTION INTERVAL: 350 ms
MDC IDC STAT BRADY RV PERCENT PACED: 0 %
Zone Setting Detection Interval: 300 ms

## 2013-12-17 NOTE — Progress Notes (Signed)
Remote ICD transmission.   

## 2013-12-25 ENCOUNTER — Encounter: Payer: Self-pay | Admitting: Cardiology

## 2013-12-26 ENCOUNTER — Other Ambulatory Visit: Payer: Self-pay

## 2013-12-26 MED ORDER — SPIRONOLACTONE 25 MG PO TABS: 12.5000 mg | ORAL_TABLET | Freq: Every day | ORAL | Status: DC

## 2014-01-02 ENCOUNTER — Telehealth: Payer: Self-pay | Admitting: Cardiovascular Disease

## 2014-01-02 NOTE — Telephone Encounter (Signed)
New message     Can she take norel ad for congestion?  It was prescribed by her PCP.

## 2014-01-02 NOTE — Telephone Encounter (Signed)
Norel AD is a combination decongestant/acetaminophen/antihistamine.  The pt's PCP gave her a Rx for antibiotic and gave a steroid injection during office visit.  I advised the pt that Norel AD is not recommended with cardiac patient's due to increased risk of elevated BP and pulse.  At this time the pt will not take Norel AD.  The pt will take antibiotic, saline nasal spray and she may pick up an OTC steroid nasal spray.

## 2014-01-26 ENCOUNTER — Other Ambulatory Visit: Payer: Self-pay | Admitting: Cardiovascular Disease

## 2014-02-13 ENCOUNTER — Other Ambulatory Visit: Payer: Self-pay | Admitting: *Deleted

## 2014-02-13 MED ORDER — DIGOXIN 250 MCG PO TABS: 0.1250 mg | ORAL_TABLET | Freq: Every day | ORAL | Status: DC

## 2014-03-18 ENCOUNTER — Telehealth: Payer: Self-pay | Admitting: Cardiology

## 2014-03-18 ENCOUNTER — Ambulatory Visit (INDEPENDENT_AMBULATORY_CARE_PROVIDER_SITE_OTHER): Payer: Managed Care, Other (non HMO) | Admitting: *Deleted

## 2014-03-18 ENCOUNTER — Encounter: Payer: Self-pay | Admitting: Internal Medicine

## 2014-03-18 DIAGNOSIS — I429 Cardiomyopathy, unspecified: Secondary | ICD-10-CM

## 2014-03-18 DIAGNOSIS — Z95810 Presence of automatic (implantable) cardiac defibrillator: Secondary | ICD-10-CM

## 2014-03-18 LAB — MDC_IDC_ENUM_SESS_TYPE_REMOTE
Battery Remaining Longevity: 99 mo
Battery Voltage: 3.17 V
Date Time Interrogation Session: 20151104174337
HighPow Impedance: 77 Ohm
HighPow Impedance: 77 Ohm
Implantable Pulse Generator Serial Number: 7152745
Lead Channel Impedance Value: 590 Ohm
Lead Channel Pacing Threshold Pulse Width: 0.5 ms
Lead Channel Sensing Intrinsic Amplitude: 12 mV
Lead Channel Setting Pacing Amplitude: 2.5 V
Lead Channel Setting Pacing Pulse Width: 0.5 ms
Lead Channel Setting Sensing Sensitivity: 0.5 mV
MDC IDC MSMT BATTERY REMAINING PERCENTAGE: 91 %
MDC IDC MSMT LEADCHNL RV PACING THRESHOLD AMPLITUDE: 1 V
MDC IDC SET ZONE DETECTION INTERVAL: 300 ms
MDC IDC STAT BRADY RV PERCENT PACED: 0 %
Zone Setting Detection Interval: 350 ms

## 2014-03-18 NOTE — Progress Notes (Signed)
Remote ICD transmission.   

## 2014-03-18 NOTE — Telephone Encounter (Signed)
Spoke with pt and reminded pt of remote transmission that is due today. Pt verbalized understanding.   

## 2014-04-02 ENCOUNTER — Encounter: Payer: Self-pay | Admitting: Cardiology

## 2014-04-23 ENCOUNTER — Encounter (HOSPITAL_COMMUNITY): Payer: Self-pay | Admitting: Internal Medicine

## 2014-06-08 ENCOUNTER — Encounter: Payer: Self-pay | Admitting: Internal Medicine

## 2014-06-18 ENCOUNTER — Encounter: Payer: Managed Care, Other (non HMO) | Admitting: *Deleted

## 2014-06-18 ENCOUNTER — Telehealth: Payer: Self-pay | Admitting: Cardiology

## 2014-06-18 NOTE — Telephone Encounter (Signed)
Attempted to confirm remote transmission with pt. No answer and was unable to leave a message.   

## 2014-06-19 ENCOUNTER — Other Ambulatory Visit: Payer: Self-pay

## 2014-06-19 ENCOUNTER — Other Ambulatory Visit: Payer: Self-pay | Admitting: Cardiovascular Disease

## 2014-06-19 ENCOUNTER — Encounter: Payer: Self-pay | Admitting: Cardiology

## 2014-06-19 MED ORDER — VALSARTAN 40 MG PO TABS: 40.0000 mg | ORAL_TABLET | Freq: Every day | ORAL | Status: DC

## 2014-07-01 ENCOUNTER — Ambulatory Visit (INDEPENDENT_AMBULATORY_CARE_PROVIDER_SITE_OTHER): Payer: BLUE CROSS/BLUE SHIELD | Admitting: Cardiovascular Disease

## 2014-07-01 ENCOUNTER — Encounter: Payer: Self-pay | Admitting: Cardiovascular Disease

## 2014-07-01 ENCOUNTER — Ambulatory Visit (INDEPENDENT_AMBULATORY_CARE_PROVIDER_SITE_OTHER): Payer: BLUE CROSS/BLUE SHIELD | Admitting: *Deleted

## 2014-07-01 VITALS — BP 104/74 | HR 90 | Ht 64.0 in | Wt 161.0 lb

## 2014-07-01 DIAGNOSIS — I5022 Chronic systolic (congestive) heart failure: Secondary | ICD-10-CM | POA: Diagnosis not present

## 2014-07-01 DIAGNOSIS — I2510 Atherosclerotic heart disease of native coronary artery without angina pectoris: Secondary | ICD-10-CM

## 2014-07-01 DIAGNOSIS — I10 Essential (primary) hypertension: Secondary | ICD-10-CM

## 2014-07-01 DIAGNOSIS — E785 Hyperlipidemia, unspecified: Secondary | ICD-10-CM

## 2014-07-01 LAB — MDC_IDC_ENUM_SESS_TYPE_INCLINIC
Battery Remaining Longevity: 94.8 mo
Brady Statistic RV Percent Paced: 0 %
HighPow Impedance: 67.5 Ohm
Lead Channel Pacing Threshold Amplitude: 1 V
Lead Channel Pacing Threshold Pulse Width: 0.5 ms
Lead Channel Sensing Intrinsic Amplitude: 11.7 mV
Lead Channel Setting Pacing Amplitude: 2.5 V
Lead Channel Setting Pacing Pulse Width: 0.5 ms
Lead Channel Setting Sensing Sensitivity: 0.5 mV
MDC IDC MSMT LEADCHNL RV IMPEDANCE VALUE: 612.5 Ohm
MDC IDC MSMT LEADCHNL RV PACING THRESHOLD AMPLITUDE: 1 V
MDC IDC MSMT LEADCHNL RV PACING THRESHOLD PULSEWIDTH: 0.5 ms
MDC IDC PG SERIAL: 7152745
MDC IDC SESS DTM: 20160217144122
MDC IDC SET ZONE DETECTION INTERVAL: 350 ms
Zone Setting Detection Interval: 300 ms

## 2014-07-01 NOTE — Progress Notes (Signed)
Cardiology Office Note   Date:  07/01/2014   ID:  Emily Mays, DOB 01-28-1961, MRN [id number]  PCP:  Emily Nasuti, MD  Cardiologist:  Sherren Mocha, MD    No chief complaint on file.    History of Present Illness: Emily Mays is a 54 y.o. female who presents for follow-up of CAD and chronic systolic heart failure. 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 This required extensive stenting of the LAD and left circumflex. She dissected back to the left main. She went on to develop a severe ischemic cardiomyopathy and was treated with an ICD for primary prevention of sudden cardiac death.  She has had some anxiety at work and was having 'panic attacks,' but these have improved with some medication changes by her PCP. From a cardiac perspective, she denies chest pain or pressure. She does complain of shortness of breath with exertion, no change since her last visit here. She was walking an hour at the gym but began having problems with her feet.  No orthopnea or PND. No leg swelling. She does admit to some dizziness with positional changes.   Past Medical History  Diagnosis Date  . Anemia   . Other specified forms of chronic ischemic heart disease     echo 07/26/2008 EfF 30% with perapical HK  . Hypokalemia   . Unspecified essential hypertension   . Esophageal reflux   . Paralytic ileus   . CAD (coronary artery disease)     spontaneous LAD dissection presenting with anterior STEMI. s/p MI 07/24/08 with PCI Endeavor DES to Prox. LAD and BMA to prox. LCX.. relook cath 07/28/08 showing cont. patency of LAD to LCX.  Marland Kitchen Chronic systolic HF (heart failure)   . DDD (degenerative disc disease)   . GERD (gastroesophageal reflux disease)     Past Surgical History  Procedure Laterality Date  . Cholecystectomy      27 years ago  . Cardiac catheterization  March 2010    2 cardiac stent placement in March 2010.   . Cardiac defibrillator placement  december 2010    [place]  . Abdominal hysterectomy    . Bilateral salpingoophorectomy    . Total abdominal hysterectomy w/ bilateral salpingoophorectomy    . Left pelvic lymphadenectomy      by Dr. Marti Sleigh   . Implantable cardioverter defibrillator generator change N/A 05/16/2013    Procedure: IMPLANTABLE CARDIOVERTER DEFIBRILLATOR GENERATOR CHANGE;  Surgeon: Coralyn Mark, MD;  Location: [facility] CATH LAB;  Service: Cardiovascular;  Laterality: N/A;    Current Outpatient Prescriptions  Medication Sig Dispense Refill  . ALPRAZolam (XANAX) 0.5 MG tablet Take 0.5 mg by mouth at bedtime as needed.     Marland Kitchen aspirin EC 81 MG tablet Take 81 mg by mouth daily.    . Canagliflozin (INVOKANA) 100 MG TABS Take 100 mg by mouth daily.    . carvedilol (COREG) 25 MG tablet Take 1 tablet (25 mg total) by mouth 2 (two) times daily with a meal. 60 tablet 6  . carvedilol (COREG) 25 MG tablet TAKE 1 TABLET (25 MG TOTAL) BY MOUTH 2 (TWO) TIMES DAILY WITH A MEAL. 60 tablet 6  . clopidogrel (PLAVIX) 75 MG tablet Take 1 tablet (75 mg total) by mouth at bedtime. 90 tablet 3  . digoxin (LANOXIN) 0.25 MG tablet Take 0.5 tablets (0.125 mg total) by mouth daily. 15 tablet 3  . furosemide (LASIX) 20 MG tablet Take 1 tablet (20  mg total) by mouth daily. 30 tablet 6  . glipiZIDE (GLUCOTROL) 10 MG tablet Take 10 mg by mouth daily.     Marland Kitchen LUZU 1 % CREA Apply topically as needed.     . nitroGLYCERIN (NITROSTAT) 0.4 MG SL tablet Place 0.4 mg under the tongue every 5 (five) minutes as needed.    Marland Kitchen oxyCODONE-acetaminophen (PERCOCET) 5-325 MG per tablet Take 1 tablet by mouth every 4 (four) hours as needed for moderate pain or severe pain.     . pantoprazole (PROTONIX) 40 MG tablet Take 40 mg by mouth daily.     . simvastatin (ZOCOR) 40 MG tablet Take 40 mg by mouth daily.     . sitaGLIPtan-metformin (JANUMET) 50-1000 MG per tablet Take 1 tablet by mouth 2 (two) times daily with a meal.     . spironolactone (ALDACTONE) 25 MG tablet Take  0.5 tablets (12.5 mg total) by mouth daily. 45 tablet 1  . valsartan (DIOVAN) 40 MG tablet Take 1 tablet (40 mg total) by mouth daily. 90 tablet 3   No current facility-administered medications for this visit.    Allergies:   Duloxetine hcl and Penicillins   Social History:  The patient  reports that she has quit smoking. She does not have any smokeless tobacco history on file. She reports that she drinks alcohol.   Family History:  The patient's  family history includes Breast cancer in her paternal aunt; Cancer in her father; Kidney cancer in her paternal aunt; Throat cancer in her paternal uncle.    ROS:  Please see the history of present illness.  Otherwise, review of systems is positive for visual changes (blurry vision), depression, back pain, dizziness, fatigue, and leg pain.  All other systems are reviewed and negative.   PHYSICAL EXAM: VS:  BP 104/74 mmHg  Pulse 90  Ht 5\' 4"  (1.626 m)  Wt 161 lb (73.029 kg)  BMI 27.62 kg/m2 , BMI Body mass index is 27.62 kg/(m^2). GEN: Well nourished, well developed, in no acute distress HEENT: normal Neck: no JVD, no masses, no carotid bruits Cardiac: RRR without murmur or gallop                Respiratory:  clear to auscultation bilaterally, normal work of breathing GI: soft, nontender, nondistended, + BS MS: no deformity or atrophy Ext: no pretibial edema Skin: warm and dry, no rash Neuro:  Strength and sensation are intact Psych: euthymic mood, full affect  EKG:  EKG is ordered today. The ekg ordered today shows NSR 85 bpm, age-indeterminate septal infarct  Recent Labs: No results found for requested labs within last 365 days.   Lipid Panel     Component Value Date/Time   CHOL 117 04/04/2010 0935   TRIG 151.0* 04/04/2010 0935   HDL 31.70* 04/04/2010 0935   CHOLHDL 4 04/04/2010 0935   VLDL 30.2 04/04/2010 0935   LDLCALC 55 04/04/2010 0935   LDLDIRECT 71.4 09/17/2009 1044      Wt Readings from Last 3 Encounters:    07/01/14 161 lb (73.029 kg)  09/03/13 166 lb (75.297 kg)  07/04/13 166 lb 12.8 oz (75.66 kg)    Cardiac Studies Reviewed: 2D Echo 07/22/2013: Study Conclusions  Impressions: Limited constrast study to r/o apical thrombus  LV moderately dilated with septal apical and anterior hypokinesis EF about 35% Apical trabeculations and LV band but no obvious thrombus with definity Impressions:  - Limited constrast study to r/o apical thrombus LV moderately dilated with septal apical and  anterior hypokinesis EF about 35% Apical trabeculations and LV band but no obvious thrombus with definity  ASSESSMENT AND PLAN: 1.  Chronic systolic heart failure, NYHA Functional Class II.  2. Severe ischemic cardiomyopathy, LVEF < 30% 3. CAD< native vessel, without angina 4. S/p ICD implantation  CorVue suggests increased lung water and even though symptoms are stable, I asked her to increase lasix over the next 3 days to see if any clinicl benefit in her breathing. Otherwise would continue same medical therapy. Other medications will be continued without change. I will see her back in 6 months for follow-up evaluation.   Current medicines are reviewed with the patient today.  The patient does not have concerns regarding medicines.  The following changes have been made:  See above  Labs/ tests ordered today include:  No orders of the defined types were placed in this encounter.    Disposition:   FU 6 months  Signed, Sherren Mocha, MD  07/01/2014 2:39 PM    [address], [place], [place]  [address] Phone: [phone]; Fax: [phone]

## 2014-07-01 NOTE — Progress Notes (Signed)
ICD check in clinic. Normal device function. Thresholds and sensing consistent with previous device measurements. Impedance trends stable over time. No evidence of any ventricular arrhythmias.  Histogram distribution appropriate for patient and level of activity.  CorVue abnormal 2/3-2/11 without symptoms and discussed with Dr. Burt Knack this visit.  No changes made this session. Device programmed at appropriate safety margins. Device programmed to optimize intrinsic conduction. Estimated longevity 7.9 years. Pt enrolled in remote follow-up. Plan to check device every 3 months remotely and in office annually. Patient education completed including shock plan. Alert tones/vibration demonstrated for patient.  ROV in April with Dr. Rayann Heman.

## 2014-07-01 NOTE — Patient Instructions (Signed)
Your physician has recommended you make the following change in your medication:  Please increase your Furosemide to 40mg  once a day for 3 DAYS and then decrease to 20mg  once a day  Your physician wants you to follow-up in: 6 MONTHS with Dr Burt Knack.  You will receive a reminder letter in the mail two months in advance. If you don't receive a letter, please call our office to schedule the follow-up appointment.

## 2014-07-09 ENCOUNTER — Encounter: Payer: Self-pay | Admitting: Internal Medicine

## 2014-08-23 ENCOUNTER — Other Ambulatory Visit: Payer: Self-pay | Admitting: Cardiovascular Disease

## 2014-09-07 ENCOUNTER — Ambulatory Visit (INDEPENDENT_AMBULATORY_CARE_PROVIDER_SITE_OTHER): Payer: BLUE CROSS/BLUE SHIELD | Admitting: Internal Medicine

## 2014-09-07 ENCOUNTER — Encounter: Payer: Self-pay | Admitting: Internal Medicine

## 2014-09-07 VITALS — BP 92/60 | HR 74 | Ht 63.0 in | Wt 159.2 lb

## 2014-09-07 DIAGNOSIS — I255 Ischemic cardiomyopathy: Secondary | ICD-10-CM | POA: Diagnosis not present

## 2014-09-07 LAB — MDC_IDC_ENUM_SESS_TYPE_INCLINIC
Battery Remaining Longevity: 94.8 mo
Brady Statistic RV Percent Paced: 0 %
Date Time Interrogation Session: 20160425130905
HighPow Impedance: 69.75 Ohm
Lead Channel Impedance Value: 625 Ohm
Lead Channel Pacing Threshold Amplitude: 1 V
Lead Channel Pacing Threshold Pulse Width: 0.5 ms
Lead Channel Setting Pacing Amplitude: 2.5 V
Lead Channel Setting Sensing Sensitivity: 0.5 mV
MDC IDC MSMT LEADCHNL RV SENSING INTR AMPL: 11.7 mV
MDC IDC PG SERIAL: 7152745
MDC IDC SET LEADCHNL RV PACING PULSEWIDTH: 0.5 ms
MDC IDC SET ZONE DETECTION INTERVAL: 350 ms
Zone Setting Detection Interval: 300 ms

## 2014-09-07 NOTE — Patient Instructions (Signed)
Medication Instructions:  Your physician recommends that you continue on your current medications as directed. Please refer to the Current Medication list given to you today.   Labwork: None ordered  Testing/Procedures: None ordered  Follow-Up: Remote monitoring is used to monitor your Pacemaker or ICD from home. This monitoring reduces the number of office visits required to check your device to one time per year. It allows [place] to keep an eye on the functioning of your device to ensure it is working properly. You are scheduled for a device check from home on 12/07/14. You may send your transmission at any time that day. If you have a wireless device, the transmission will be sent automatically. After your physician reviews your transmission, you will receive a postcard with your next transmission date.  Alvis Lemmings, RN will call you in regards to monthly ICM monitoring  Your physician wants you to follow-up in: 12 months with Chanetta Marshall, NP You will receive a reminder letter in the mail two months in advance. If you don't receive a letter, please call our office to schedule the follow-up appointment.       Any Other Special Instructions Will Be Listed Below (If Applicable).

## 2014-09-07 NOTE — Progress Notes (Signed)
Electrophysiology Office Note   Date:  09/07/2014   ID:  Emily Mays, DOB March 22, 1961, MRN [id number]  PCP:  Bonnita Nasuti, MD  Cardiologist:  Dr Burt Knack Primary Electrophysiologist: Thompson Grayer, MD    Chief Complaint  Patient presents with  . Other specified forms of chronic ischemic heart disease     History of Present Illness: Emily Mays is a 54 y.o. female who presents today for electrophysiology evaluation.   She is doing well.  Continues to work.  Denies SOB or chest pain recently.  Rare palpitations.  Today, she denies symptoms of  orthopnea, PND, lower extremity edema, claudication, dizziness, presyncope, syncope, bleeding, or neurologic sequela. The patient is tolerating medications without difficulties and is otherwise without complaint today.    Past Medical History  Diagnosis Date  . Anemia   . Other specified forms of chronic ischemic heart disease     echo 07/26/2008 EfF 30% with perapical HK  . Hypokalemia   . Unspecified essential hypertension   . Esophageal reflux   . Paralytic ileus   . CAD (coronary artery disease)     spontaneous LAD dissection presenting with anterior STEMI. s/p MI 07/24/08 with PCI Endeavor DES to Prox. LAD and BMA to prox. LCX.. relook cath 07/28/08 showing cont. patency of LAD to LCX.  Marland Kitchen Chronic systolic HF (heart failure)   . DDD (degenerative disc disease)   . GERD (gastroesophageal reflux disease)    Past Surgical History  Procedure Laterality Date  . Cholecystectomy      27 years ago  . Cardiac catheterization  March 2010    2 cardiac stent placement in March 2010.   . Cardiac defibrillator placement  december 2010    [place]  . Abdominal hysterectomy    . Bilateral salpingoophorectomy    . Total abdominal hysterectomy w/ bilateral salpingoophorectomy    . Left pelvic lymphadenectomy      by Dr. Marti Sleigh   . Implantable cardioverter defibrillator generator change N/A 05/16/2013    Procedure:  IMPLANTABLE CARDIOVERTER DEFIBRILLATOR GENERATOR CHANGE;  Surgeon: Coralyn Mark, MD;  Location: [facility] CATH LAB;  Service: Cardiovascular;  Laterality: N/A;     Current Outpatient Prescriptions  Medication Sig Dispense Refill  . ALPRAZolam (XANAX) 0.5 MG tablet Take 0.5 mg by mouth at bedtime as needed.     Marland Kitchen aspirin EC 81 MG tablet Take 81 mg by mouth daily.    . Canagliflozin (INVOKANA) 100 MG TABS Take 100 mg by mouth daily.    . carvedilol (COREG) 25 MG tablet TAKE 1 TABLET (25 MG TOTAL) BY MOUTH 2 (TWO) TIMES DAILY WITH A MEAL. 60 tablet 5  . clopidogrel (PLAVIX) 75 MG tablet Take 1 tablet (75 mg total) by mouth at bedtime. 90 tablet 3  . digoxin (LANOXIN) 0.25 MG tablet Take 0.5 tablets (0.125 mg total) by mouth daily. 15 tablet 3  . furosemide (LASIX) 20 MG tablet Take 1 tablet (20 mg total) by mouth daily. 30 tablet 6  . glipiZIDE (GLUCOTROL) 10 MG tablet Take 10 mg by mouth daily.     Marland Kitchen LUZU 1 % CREA Apply topically as needed.     . nitroGLYCERIN (NITROSTAT) 0.4 MG SL tablet Place 0.4 mg under the tongue every 5 (five) minutes as needed.    Marland Kitchen oxyCODONE-acetaminophen (PERCOCET) 5-325 MG per tablet Take 1 tablet by mouth every 4 (four) hours as needed for moderate pain or severe pain.     . pantoprazole (PROTONIX)  40 MG tablet Take 40 mg by mouth daily.     . simvastatin (ZOCOR) 40 MG tablet Take 40 mg by mouth daily.     . sitaGLIPtan-metformin (JANUMET) 50-1000 MG per tablet Take 1 tablet by mouth 2 (two) times daily with a meal.     . spironolactone (ALDACTONE) 25 MG tablet Take 0.5 tablets (12.5 mg total) by mouth daily. 45 tablet 1  . valsartan (DIOVAN) 40 MG tablet Take 1 tablet (40 mg total) by mouth daily. 90 tablet 3   No current facility-administered medications for this visit.    Allergies:   Duloxetine hcl and Penicillins   Social History:  The patient  reports that she has quit smoking. She does not have any smokeless tobacco history on file. She reports that she drinks  alcohol.   Family History:  The patient's family history includes Breast cancer in her paternal aunt; CAD in her mother; Cancer in her father; Kidney cancer in her paternal aunt; Other (age of onset: 61) in her father; Throat cancer in her paternal uncle.    ROS:  Please see the history of present illness.   All other systems are reviewed and negative.    PHYSICAL EXAM: VS:  BP 92/60 mmHg  Pulse 74  Ht 5\' 3"  (1.6 m)  Wt 159 lb 3.2 oz (72.213 kg)  BMI 28.21 kg/m2 , BMI Body mass index is 28.21 kg/(m^2). GEN: Well nourished, well developed, in no acute distress HEENT: normal Neck: no JVD, carotid bruits, or masses Cardiac: RRR; no murmurs, rubs, or gallops,no edema  Respiratory:  clear to auscultation bilaterally, normal work of breathing GI: soft, nontender, nondistended, + BS MS: no deformity or atrophy Skin: warm and dry, device pocket is well healed Neuro:  Strength and sensation are intact Psych: euthymic mood, full affect  Device interrogation is reviewed today in detail.  See PaceArt for details.   Recent Labs: No results found for requested labs within last 365 days.    Lipid Panel     Component Value Date/Time   CHOL 117 04/04/2010 0935   TRIG 151.0* 04/04/2010 0935   HDL 31.70* 04/04/2010 0935   CHOLHDL 4 04/04/2010 0935   VLDL 30.2 04/04/2010 0935   LDLCALC 55 04/04/2010 0935   LDLDIRECT 71.4 09/17/2009 1044     Wt Readings from Last 3 Encounters:  09/07/14 159 lb 3.2 oz (72.213 kg)  07/01/14 161 lb (73.029 kg)  09/03/13 166 lb (75.297 kg)      Other studies Reviewed: Additional studies/ records that were reviewed today include: Dr York Cerise notes    ASSESSMENT AND PLAN:   1. Ischemic CM Normal ICD function See Pace Art report No changes today Merlin Will re-enroll in ICM clinic with Alvis Lemmings   2. HTN Stable No change required today  merlin Return to see EP NP in 1 year Follow-up with Dr Burt Knack as scheduled   Current medicines  are reviewed at length with the patient today.   The patient does not have concerns regarding her medicines.  The following changes were made today:  none  Signed, Thompson Grayer, MD  09/07/2014 12:04 PM     [address] [place] [address] [phone] (office) [phone] (fax)

## 2014-10-22 ENCOUNTER — Telehealth: Payer: Self-pay | Admitting: Cardiovascular Disease

## 2014-10-22 NOTE — Telephone Encounter (Signed)
New message      Pt had cholesterol lab at PCP.  They told her the cholesterol level was 96 and trig 253.  Her PCP wanted to take her off the cholesterol medication.  She will have PCP fax over labs.

## 2014-10-23 ENCOUNTER — Encounter: Payer: Self-pay | Admitting: Internal Medicine

## 2014-10-23 NOTE — Telephone Encounter (Signed)
Labs are scanned into the pt's chart for Dr Burt Knack to review (the pt's LDL was 12).

## 2014-11-03 NOTE — Telephone Encounter (Signed)
I agree with stopping her statin. She does not have atherosclerotic CAD. thx

## 2014-11-03 NOTE — Telephone Encounter (Signed)
I made the pt aware that Dr Burt Knack does recommend stopping Simvastatin.

## 2014-12-07 ENCOUNTER — Ambulatory Visit (INDEPENDENT_AMBULATORY_CARE_PROVIDER_SITE_OTHER): Payer: BLUE CROSS/BLUE SHIELD

## 2014-12-07 ENCOUNTER — Encounter: Payer: Self-pay | Admitting: Internal Medicine

## 2014-12-07 DIAGNOSIS — Z95810 Presence of automatic (implantable) cardiac defibrillator: Secondary | ICD-10-CM | POA: Diagnosis not present

## 2014-12-07 DIAGNOSIS — I255 Ischemic cardiomyopathy: Secondary | ICD-10-CM | POA: Diagnosis not present

## 2014-12-11 ENCOUNTER — Telehealth: Payer: Self-pay | Admitting: Cardiovascular Disease

## 2014-12-11 NOTE — Telephone Encounter (Signed)
New message    Pt c/o Shortness Of Breath: STAT if SOB developed within the last 24 hours or pt is noticeably SOB on the phone  1. Are you currently SOB (can you hear that pt is SOB on the phone)? No, comes and goes  2. How long have you been experiencing SOB? The summer, pt states she thinks SOB it is coming from the heat  3. Are you SOB when sitting or when up moving around? When moving around  4. Are you currently experiencing any other symptoms? No  Pt also has question about digoxin Pt states chest is not hurting Pt states heart is racing at times Please call to discuss

## 2014-12-11 NOTE — Telephone Encounter (Signed)
Spoke with pt. She reports shortness of breath for last 2 months. It is worse when she overeats and feels bloated. Short of breath when walking to the mailbox in the afternoon.  Worse in the heat. Works at night and is able to work without problems because she is in the [place].  Feels heart rate speeding up and fluttering at times. Takes Xanax and this helps. No chest pain. Pt asking about digoxin dose.  I told her our records indicate she is taking 0.125 mg daily and she confirms she is taking this dose.  Will review with provider in office.

## 2014-12-11 NOTE — Telephone Encounter (Signed)
Reviewed with Lucillie Garfinkel who recommends office visit on Monday with flex provider and to go to ED if symptoms worsen.  Appt scheduled for pt to see Truitt Merle, NP on December 14, 2014 at 3:30.  She is aware to go to ED if problems prior to this appt.

## 2014-12-14 ENCOUNTER — Ambulatory Visit (INDEPENDENT_AMBULATORY_CARE_PROVIDER_SITE_OTHER): Payer: BLUE CROSS/BLUE SHIELD | Admitting: *Deleted

## 2014-12-14 ENCOUNTER — Ambulatory Visit (INDEPENDENT_AMBULATORY_CARE_PROVIDER_SITE_OTHER): Payer: BLUE CROSS/BLUE SHIELD | Admitting: Nurse Practitioner

## 2014-12-14 ENCOUNTER — Encounter: Payer: Self-pay | Admitting: Nurse Practitioner

## 2014-12-14 VITALS — BP 88/64 | HR 85 | Resp 18 | Ht 63.0 in | Wt 163.0 lb

## 2014-12-14 DIAGNOSIS — R0600 Dyspnea, unspecified: Secondary | ICD-10-CM

## 2014-12-14 DIAGNOSIS — I5022 Chronic systolic (congestive) heart failure: Secondary | ICD-10-CM | POA: Diagnosis not present

## 2014-12-14 DIAGNOSIS — I255 Ischemic cardiomyopathy: Secondary | ICD-10-CM

## 2014-12-14 DIAGNOSIS — Z95810 Presence of automatic (implantable) cardiac defibrillator: Secondary | ICD-10-CM

## 2014-12-14 LAB — CUP PACEART INCLINIC DEVICE CHECK
Brady Statistic RV Percent Paced: 0 %
HighPow Impedance: 73.125
Lead Channel Setting Pacing Pulse Width: 0.5 ms
MDC IDC MSMT BATTERY REMAINING LONGEVITY: 92.4 mo
MDC IDC MSMT LEADCHNL RV IMPEDANCE VALUE: 550 Ohm
MDC IDC SESS DTM: 20160801170430
MDC IDC SET LEADCHNL RV PACING AMPLITUDE: 2.5 V
MDC IDC SET LEADCHNL RV SENSING SENSITIVITY: 0.5 mV
Pulse Gen Serial Number: 7152745
Zone Setting Detection Interval: 300 ms
Zone Setting Detection Interval: 350 ms

## 2014-12-14 LAB — BASIC METABOLIC PANEL
BUN: 10 mg/dL (ref 6–23)
CO2: 25 mEq/L (ref 19–32)
Calcium: 9.5 mg/dL (ref 8.4–10.5)
Chloride: 104 mEq/L (ref 96–112)
Creatinine, Ser: 0.78 mg/dL (ref 0.40–1.20)
GFR: 82.55 mL/min (ref >60.00)
Glucose, Bld: 200 mg/dL — ABNORMAL HIGH (ref 70–99)
Potassium: 3.9 mEq/L (ref 3.5–5.1)
Sodium: 138 mEq/L (ref 135–145)

## 2014-12-14 LAB — CBC
HCT: 38.4 % (ref 36.0–46.0)
Hemoglobin: 12.4 g/dL (ref 12.0–15.0)
MCHC: 32.4 g/dL (ref 30.0–36.0)
MCV: 86.9 fl (ref 78.0–100.0)
Platelets: 248 10*3/uL (ref 150.0–400.0)
RBC: 4.42 Mil/uL (ref 3.87–5.11)
RDW: 15.1 % (ref 11.5–15.5)
WBC: 8.4 10*3/uL (ref 4.0–10.5)

## 2014-12-14 MED ORDER — NITROGLYCERIN 0.4 MG SL SUBL: 0.4000 mg | SUBLINGUAL_TABLET | SUBLINGUAL | Status: DC | PRN

## 2014-12-14 MED ORDER — CLOPIDOGREL BISULFATE 75 MG PO TABS: 75.0000 mg | ORAL_TABLET | Freq: Every day | ORAL | Status: DC

## 2014-12-14 NOTE — Progress Notes (Signed)
CARDIOLOGY OFFICE NOTE  Date:  12/14/2014    Emily Mays Date of Birth: Apr 21, 1961 Medical Record #[id number]  PCP:  Bonnita Nasuti, MD  Cardiologist:  Burt Knack Allred    Chief Complaint  Patient presents with  . Shortness of Breath    Work in visit -seen for Dr. Burt Knack and Allred    History of Present Illness: Emily Mays is a 54 y.o. female who presents today for a work in visit. Seen for Dr. Burt Knack and Dr. Rayann Heman. She has CAD and chronic systolic heart failure. 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 This required extensive stenting of the LAD and left circumflex. She dissected back to the left main. She went on to develop a severe ischemic cardiomyopathy and was treated with an ICD for primary prevention of sudden cardiac death.  She was seen back in 09-26-2022 by Dr. Rayann Heman - stable visit.   Saw Dr. Burt Knack back in February - had had some anxiety at work and was having 'panic attacks,' but these improved with some medication changes by her PCP. From a cardiac perspective, she was doing well.  Phone call last week - "Spoke with pt. She reports shortness of breath for last 2 months. It is worse when she overeats and feels bloated. Short of breath when walking to the mailbox in the afternoon. Worse in the heat. Works at night and is able to work without problems because she is in the [place]. Feels heart rate speeding up and fluttering at times. Takes Xanax and this helps. No chest pain. Pt asking about digoxin dose. I told her our records indicate she is taking 0.125 mg daily and she confirms she is taking this dose. Will review with provider in office."  Thus added to the FLEX for today.   Comes in today. Here alone. Tells me that she was called to schedule her OV with Dr. Burt Knack and was asked how she was feeling. 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 She has had  some racing in her chest. Not really dizzy. No shocks. She feels like most of this is heat related - she is fine as long as she is in the air conditioning. No chest pain. Needs Plavix and NTG refilled.    Past Medical History  Diagnosis Date  . Anemia   . Other specified forms of chronic ischemic heart disease     echo 07/26/2008 EfF 30% with perapical HK  . Hypokalemia   . Unspecified essential hypertension   . Esophageal reflux   . Paralytic ileus   . CAD (coronary artery disease)     spontaneous LAD dissection presenting with anterior STEMI. s/p MI 07/24/08 with PCI Endeavor DES to Prox. LAD and BMA to prox. LCX.. relook cath 07/28/08 showing cont. patency of LAD to LCX.  Marland Kitchen Chronic systolic HF (heart failure)   . DDD (degenerative disc disease)   . GERD (gastroesophageal reflux disease)     Past Surgical History  Procedure Laterality Date  . Cholecystectomy      27 years ago  . Cardiac catheterization  March 2010    2 cardiac stent placement in March 2010.   . Cardiac defibrillator placement  december 2010    [place]  . Abdominal hysterectomy    . Bilateral salpingoophorectomy    . Total abdominal hysterectomy w/ bilateral salpingoophorectomy    .  Left pelvic lymphadenectomy      by Dr. Marti Sleigh   . Implantable cardioverter defibrillator generator change N/A 05/16/2013    Procedure: IMPLANTABLE CARDIOVERTER DEFIBRILLATOR GENERATOR CHANGE;  Surgeon: Coralyn Mark, MD;  Location: [facility] CATH LAB;  Service: Cardiovascular;  Laterality: N/A;     Medications: Current Outpatient Prescriptions  Medication Sig Dispense Refill  . ALPRAZolam (XANAX) 0.5 MG tablet Take 0.5 mg by mouth at bedtime as needed.     Marland Kitchen aspirin EC 81 MG tablet Take 81 mg by mouth daily.    . carvedilol (COREG) 25 MG tablet TAKE 1 TABLET (25 MG TOTAL) BY MOUTH 2 (TWO) TIMES DAILY WITH A MEAL. 60 tablet 5  . clopidogrel (PLAVIX) 75 MG tablet Take 1 tablet (75 mg total) by mouth at bedtime. 90 tablet 3   . DIGOX 125 MCG tablet Take 1 tablet by mouth daily.    Marland Kitchen FARXIGA 10 MG TABS tablet Take 10 mg by mouth daily.    . furosemide (LASIX) 20 MG tablet Take 1 tablet (20 mg total) by mouth daily. 30 tablet 6  . glipiZIDE (GLUCOTROL) 10 MG tablet Take 10 mg by mouth daily.     . nitroGLYCERIN (NITROSTAT) 0.4 MG SL tablet Place 1 tablet (0.4 mg total) under the tongue every 5 (five) minutes as needed. 25 tablet 6  . ONE TOUCH ULTRA TEST test strip     . pantoprazole (PROTONIX) 40 MG tablet Take 40 mg by mouth daily.     . sitaGLIPtan-metformin (JANUMET) 50-1000 MG per tablet Take 1 tablet by mouth 2 (two) times daily with a meal.     . spironolactone (ALDACTONE) 25 MG tablet Take 0.5 tablets (12.5 mg total) by mouth daily. 45 tablet 1  . valsartan (DIOVAN) 40 MG tablet Take 1 tablet (40 mg total) by mouth daily. 90 tablet 3  . oxyCODONE-acetaminophen (PERCOCET) 10-325 MG per tablet Take 1 tablet by mouth every 8 (eight) hours as needed.     No current facility-administered medications for this visit.    Allergies: Allergies  Allergen Reactions  . Duloxetine Hcl Other (See Comments)    Made heart race  . Penicillins Hives    Social History: The patient  reports that she has quit smoking. She does not have any smokeless tobacco history on file. She reports that she drinks alcohol.   Family History: The patient's family history includes Breast cancer in her paternal aunt; CAD in her mother; Cancer in her father; Kidney cancer in her paternal aunt; Other (age of onset: 60) in her father; Throat cancer in her paternal uncle.   Review of Systems: Please see the history of present illness.   Otherwise, the review of systems is positive for none.   All other systems are reviewed and negative.   Physical Exam: VS:  BP 88/64 mmHg  Pulse 85  Resp 18  Ht 5\' 3"  (1.6 m)  Wt 163 lb (73.936 kg)  BMI 28.88 kg/m2  SpO2 96% .  BMI Body mass index is 28.88 kg/(m^2).  Wt Readings from Last 3  Encounters:  12/14/14 163 lb (73.936 kg)  09/07/14 159 lb 3.2 oz (72.213 kg)  07/01/14 161 lb (73.029 kg)    General: Pleasant. She looks chronically ill but in no acute distress.  HEENT: Normal but with poor dentition Neck: Supple, no JVD, carotid bruits, or masses noted.  Cardiac: Regular rate and rhythm. No murmurs, rubs, or gallops. No edema.  Respiratory:  Lungs are clear to  auscultation bilaterally with normal work of breathing.  GI: Soft and nontender.  MS: No deformity or atrophy. Gait and ROM intact. Skin: Warm and dry. Color is pasty. Neuro:  Strength and sensation are intact and no gross focal deficits noted.  Psych: Alert, appropriate and with normal affect.   LABORATORY DATA:  EKG:  EKG is not ordered today.   Lab Results  Component Value Date   WBC 8.1 05/16/2013   HGB 12.6 05/16/2013   HCT 37.5 05/16/2013   PLT 189 05/16/2013   GLUCOSE 167* 05/16/2013   CHOL 117 04/04/2010   TRIG 151.0* 04/04/2010   HDL 31.70* 04/04/2010   LDLDIRECT 71.4 09/17/2009   LDLCALC 55 04/04/2010   ALT 32 09/17/2010   AST 33 09/17/2010   NA 141 05/16/2013   K 4.2 05/16/2013   CL 102 05/16/2013   CREATININE 0.67 05/16/2013   BUN 15 05/16/2013   CO2 21 05/16/2013   TSH 2.032 Test methodology is 3rd generation TSH 07/20/2008   INR 1.1 ratio* 05/04/2009   HGBA1C  07/21/2008    5.7 (NOTE)   The ADA recommends the following therapeutic goal for glycemic   control related to Hgb A1C measurement:   Goal of Therapy:   < 7.0% Hgb A1C   Reference: American Diabetes Association: Clinical Practice   Recommendations 2008, Diabetes Care,  2008, 31:(Suppl 1).    BNP (last 3 results) No results for input(s): BNP in the last 8760 hours.  ProBNP (last 3 results) No results for input(s): PROBNP in the last 8760 hours.   Other Studies Reviewed Today:  2D Echo Jul 27, 2013: Study Conclusions  Impressions: Limited constrast study to r/o apical thrombus  LV moderately dilated with septal  apical and anterior hypokinesis EF about 35% Apical trabeculations and LV band but no obvious thrombus with definity Impressions:  - Limited constrast study to r/o apical thrombus LV moderately dilated with septal apical and anterior hypokinesis EF about 35% Apical trabeculations and LV band but no obvious thrombus with definity  ASSESSMENT AND PLAN: 1. Chronic systolic heart failure, NYHA Functional Class II/III - her current issues do sound more heat related. Her device is checked - Cor Vue shows that she has had more fluid over the past 8 days - will have her take an extra dose of her Lasix for the next 3 days. Labs today. Update her echo - further disposition to follow. She is on a fair CHF regimen but BP will not allow for further titration of her medicines.   2. Severe ischemic cardiomyopathy, LVEF < 30%  3. CAD< native vessel spontaneous dissection -  without angina  4. S/P - ICD implantation - checking device today. Cor Vue abnormal over the last 8 days. Followed by Dr. Rayann Heman.  Current medicines are reviewed with the patient today.  The patient does not have concerns regarding medicines other than what has been noted above.  The following changes have been made:  See above.  Labs/ tests ordered today include:    Orders Placed This Encounter  Procedures  . Brain natriuretic peptide  . Basic metabolic panel  . CBC  . ECHOCARDIOGRAM COMPLETE     Disposition:   FU with Dr. Burt Knack and Dr. Rayann Heman as planned.   Patient is agreeable to this plan and will call if any problems develop in the interim.   Signed: Burtis Junes, RN, ANP-C 12/14/2014 4:24 PM  [facility] [address] [place], [place]  [address] Phone: [phone] Fax: [phone]

## 2014-12-14 NOTE — Patient Instructions (Addendum)
We will be checking the following labs today - CBC, BMET and BNP   Medication Instructions:    Continue with your current medicines.   Take an extra dose of Lasix for the next 3 days    Testing/Procedures To Be Arranged:  Echocardiogram  Follow-Up:   See Dr. Burt Knack as planned.    Other Special Instructions:   Weigh daily  Call the [place] office at [phone] if you have any questions, problems or concerns.

## 2014-12-14 NOTE — Progress Notes (Signed)
ICM check only for [employer]. See paceart for details.

## 2014-12-15 LAB — BRAIN NATRIURETIC PEPTIDE: Pro B Natriuretic peptide (BNP): 35 pg/mL (ref 0.0–100.0)

## 2014-12-17 LAB — CUP PACEART REMOTE DEVICE CHECK
Battery Remaining Longevity: 92 mo
Battery Remaining Percentage: 86 %
Battery Voltage: 3.05 V
HighPow Impedance: 62 Ohm
HighPow Impedance: 62 Ohm
Lead Channel Impedance Value: 580 Ohm
Lead Channel Pacing Threshold Amplitude: 1 V
Lead Channel Pacing Threshold Pulse Width: 0.5 ms
Lead Channel Sensing Intrinsic Amplitude: 11.7 mV
Lead Channel Setting Pacing Amplitude: 2.5 V
MDC IDC SESS DTM: 20160725191523
MDC IDC SET LEADCHNL RV PACING PULSEWIDTH: 0.5 ms
MDC IDC SET LEADCHNL RV SENSING SENSITIVITY: 0.5 mV
MDC IDC SET ZONE DETECTION INTERVAL: 300 ms
MDC IDC STAT BRADY RV PERCENT PACED: 0 %
Pulse Gen Serial Number: 7152745
Zone Setting Detection Interval: 350 ms

## 2014-12-22 ENCOUNTER — Other Ambulatory Visit: Payer: Self-pay

## 2014-12-22 ENCOUNTER — Ambulatory Visit (HOSPITAL_COMMUNITY): Payer: BLUE CROSS/BLUE SHIELD | Attending: Cardiovascular Disease

## 2014-12-22 DIAGNOSIS — Z87891 Personal history of nicotine dependence: Secondary | ICD-10-CM | POA: Insufficient documentation

## 2014-12-22 DIAGNOSIS — Z8249 Family history of ischemic heart disease and other diseases of the circulatory system: Secondary | ICD-10-CM | POA: Diagnosis not present

## 2014-12-22 DIAGNOSIS — R29898 Other symptoms and signs involving the musculoskeletal system: Secondary | ICD-10-CM | POA: Diagnosis not present

## 2014-12-22 DIAGNOSIS — I10 Essential (primary) hypertension: Secondary | ICD-10-CM | POA: Insufficient documentation

## 2014-12-22 DIAGNOSIS — I517 Cardiomegaly: Secondary | ICD-10-CM | POA: Diagnosis not present

## 2014-12-22 DIAGNOSIS — R0600 Dyspnea, unspecified: Secondary | ICD-10-CM

## 2014-12-22 DIAGNOSIS — I255 Ischemic cardiomyopathy: Secondary | ICD-10-CM

## 2014-12-24 ENCOUNTER — Other Ambulatory Visit (HOSPITAL_COMMUNITY): Payer: BLUE CROSS/BLUE SHIELD

## 2015-01-05 ENCOUNTER — Encounter: Payer: Self-pay | Admitting: Internal Medicine

## 2015-01-27 ENCOUNTER — Ambulatory Visit (INDEPENDENT_AMBULATORY_CARE_PROVIDER_SITE_OTHER): Payer: BLUE CROSS/BLUE SHIELD

## 2015-01-27 ENCOUNTER — Telehealth: Payer: Self-pay | Admitting: Cardiology

## 2015-01-27 DIAGNOSIS — Z95810 Presence of automatic (implantable) cardiac defibrillator: Secondary | ICD-10-CM | POA: Diagnosis not present

## 2015-01-27 DIAGNOSIS — I255 Ischemic cardiomyopathy: Secondary | ICD-10-CM | POA: Diagnosis not present

## 2015-01-27 NOTE — Telephone Encounter (Signed)
Spoke with pt and reminded pt of remote transmission that is due today. Pt verbalized understanding.   

## 2015-01-28 ENCOUNTER — Encounter: Payer: Self-pay | Admitting: Internal Medicine

## 2015-01-28 NOTE — Progress Notes (Signed)
EPIC Encounter for ICM Monitoring  Patient Name: Emily Mays is a 54 y.o. female Date: 01/28/2015 Primary Care Physican: Bonnita Nasuti, MD Primary Cardiologist: Burt Knack Electrophysiologist: Allred Dry Weight: 163 lbs at office       In the past month, have you:  1. Gained more than 2 pounds in a day or more than 5 pounds in a week? No, she does not weigh at home and has a scale but no battery. Education given on importance of daily weights for fluid monitoring.   2. Had changes in your medications (with verification of current medications)? no  3. Had more shortness of breath than is usual for you? no  4. Limited your activity because of shortness of breath? no  5. Not been able to sleep because of shortness of breath? no  6. Had increased swelling in your feet or ankles? no  7. Had symptoms of dehydration (dizziness, dry mouth, increased thirst, decreased urine output) no  8. Had changes in sodium restriction? no  9. Been compliant with medication? Yes   ICM trend:   Follow-up plan: ICM clinic phone appointment on 03/02/2015.  1st encounter with patient for ICM follow up.  CorVue impedance trending slightly below baseline ~01/22/2015 to 01/25/2015 and patient reported no symptoms at this time.  She stated at times she feels like she may have some throat congestion.  Education given on HF symptoms to report.  She asked if she should take extra Lasix if she feels like she is retaining fluid.  Advised her to discuss at the appointment with Dr Burt Knack regarding guidelines for taking extra Lasix for fluid retention.  No changes today.        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     Copy of note sent to patient's primary care physician, primary cardiologist, and device following physician.  [name], RN, CCM 01/28/2015 3:01 PM

## 2015-03-01 ENCOUNTER — Other Ambulatory Visit: Payer: Self-pay | Admitting: Cardiovascular Disease

## 2015-03-02 ENCOUNTER — Telehealth: Payer: Self-pay | Admitting: Cardiology

## 2015-03-02 NOTE — Telephone Encounter (Signed)
Attempted to confirm remote transmission with pt. No answer and was unable to leave a message.   

## 2015-03-04 NOTE — Progress Notes (Unsigned)
Patient ID: Emily Mays, female   DOB: 09-27-1960, 55 y.o.   MRN: [id number] No ICM transmission received for 03/02/2015.  Next ICM remote scheduled for 03/31/2015.

## 2015-03-05 ENCOUNTER — Ambulatory Visit (INDEPENDENT_AMBULATORY_CARE_PROVIDER_SITE_OTHER): Payer: BLUE CROSS/BLUE SHIELD | Admitting: Cardiovascular Disease

## 2015-03-05 ENCOUNTER — Encounter: Payer: Self-pay | Admitting: Cardiovascular Disease

## 2015-03-05 VITALS — BP 96/70 | HR 68 | Ht 63.0 in | Wt 163.8 lb

## 2015-03-05 DIAGNOSIS — I255 Ischemic cardiomyopathy: Secondary | ICD-10-CM | POA: Diagnosis not present

## 2015-03-05 DIAGNOSIS — I2510 Atherosclerotic heart disease of native coronary artery without angina pectoris: Secondary | ICD-10-CM

## 2015-03-05 DIAGNOSIS — I10 Essential (primary) hypertension: Secondary | ICD-10-CM

## 2015-03-05 NOTE — Patient Instructions (Signed)
Medication Instructions:  Your physician recommends that you continue on your current medications as directed. Please refer to the Current Medication list given to you today.  Labwork: Your physician recommends that you return for lab work in: 6 MONTHS, 1 week prior to appointment with Dr Burt Knack (BMP and BNP)  Testing/Procedures: No new orders.  Follow-Up: Your physician wants you to follow-up in: 6 MONTHS with Dr Burt Knack.  You will receive a reminder letter in the mail two months in advance. If you don't receive a letter, please call our office to schedule the follow-up appointment.   Any Other Special Instructions Will Be Listed Below (If Applicable).

## 2015-03-05 NOTE — Progress Notes (Signed)
Cardiology Office Note Date:  03/05/2015   ID:  Emily Mays, DOB 05-May-1961, MRN [id number]  PCP:  Bonnita Nasuti, MD  Cardiologist:  Sherren Mocha, MD    Chief Complaint  Patient presents with  . Annual Exam   History of Present Illness: Emily Mays is a 54 y.o. female who presents for follow-up of CAD and chronic systolic heart failure. 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 This required extensive stenting of the LAD and left circumflex. She dissected back to the left main. She went on to develop a severe ischemic cardiomyopathy and was treated with an ICD for primary prevention of sudden cardiac death.  The patient is doing fairly well. She has generalized fatigue but relates this to her work schedule. She works 12 hour shifts from 7 PM to 7 AM. She was seen the summer with exertional dyspnea, primarily related to heat exposure. She had an echocardiogram which showed stability of her LV systolic dysfunction with LVEF 35-40%. Her BNP was normal. Medical management has been continued. She denies edema, orthopnea, or PND. She admits to shortness of breath with moderate level activity such as walking any prolonged distance. No chest pain or pressure. No lightheadedness.   Past Medical History  Diagnosis Date  . Anemia   . Other specified forms of chronic ischemic heart disease     echo 07/26/2008 EfF 30% with perapical HK  . Hypokalemia   . Unspecified essential hypertension   . Esophageal reflux   . Paralytic ileus ([place])   . CAD (coronary artery disease)     spontaneous LAD dissection presenting with anterior STEMI. s/p MI 07/24/08 with PCI Endeavor DES to Prox. LAD and BMA to prox. LCX.. relook cath 07/28/08 showing cont. patency of LAD to LCX.  Marland Kitchen Chronic systolic HF (heart failure) ([place])   . DDD (degenerative disc disease)   . GERD (gastroesophageal reflux disease)     Past Surgical History  Procedure Laterality Date  . Cholecystectomy     27 years ago  . Cardiac catheterization  March 2010    2 cardiac stent placement in March 2010.   . Cardiac defibrillator placement  december 2010    [place]  . Abdominal hysterectomy    . Bilateral salpingoophorectomy    . Total abdominal hysterectomy w/ bilateral salpingoophorectomy    . Left pelvic lymphadenectomy      by Dr. Marti Sleigh   . Implantable cardioverter defibrillator generator change N/A 05/16/2013    Procedure: IMPLANTABLE CARDIOVERTER DEFIBRILLATOR GENERATOR CHANGE;  Surgeon: Coralyn Mark, MD;  Location: [facility] CATH LAB;  Service: Cardiovascular;  Laterality: N/A;    Current Outpatient Prescriptions  Medication Sig Dispense Refill  . ALPRAZolam (XANAX) 0.5 MG tablet Take 0.5 mg by mouth at bedtime as needed for anxiety or sleep.     Marland Kitchen aspirin EC 81 MG tablet Take 81 mg by mouth daily.    . carvedilol (COREG) 25 MG tablet TAKE 1 TABLET (25 MG TOTAL) BY MOUTH 2 (TWO) TIMES DAILY WITH A MEAL. 60 tablet 5  . clopidogrel (PLAVIX) 75 MG tablet Take 1 tablet (75 mg total) by mouth at bedtime. 90 tablet 3  . DIGOX 125 MCG tablet Take 1 tablet by mouth daily.    Marland Kitchen FARXIGA 10 MG TABS tablet Take 10 mg by mouth daily.    . furosemide (LASIX) 20 MG tablet Take 1 tablet (20 mg total) by mouth daily. 30 tablet 6  .  glipiZIDE (GLUCOTROL) 10 MG tablet Take 10 mg by mouth daily.     . nitroGLYCERIN (NITROSTAT) 0.4 MG SL tablet Place 0.4 mg under the tongue every 5 (five) minutes as needed for chest pain (up to 3 doses MAX).    . ONE TOUCH ULTRA TEST test strip     . oxyCODONE-acetaminophen (PERCOCET) 10-325 MG per tablet Take 1 tablet by mouth every 8 (eight) hours as needed for pain.     . pantoprazole (PROTONIX) 40 MG tablet Take 40 mg by mouth daily.     . sitaGLIPtan-metformin (JANUMET) 50-1000 MG per tablet Take 1 tablet by mouth 2 (two) times daily with a meal.     . spironolactone (ALDACTONE) 25 MG tablet Take 0.5 tablets (12.5 mg total) by mouth daily. 45 tablet 1  .  valsartan (DIOVAN) 40 MG tablet Take 1 tablet (40 mg total) by mouth daily. 90 tablet 3   No current facility-administered medications for this visit.    Allergies:   Duloxetine hcl and Penicillins   Social History:  The patient  reports that she has quit smoking. She does not have any smokeless tobacco history on file. She reports that she drinks alcohol.   Family History:  The patient's  family history includes Breast cancer in her paternal aunt; CAD in her mother; Cancer in her father; Kidney cancer in her paternal aunt; Other (age of onset: 29) in her father; Throat cancer in her paternal uncle.    ROS:  Please see the history of present illness.  Otherwise, review of systems is positive for hearing loss, visual disturbance, DOE, back pain, excessive fatigue, anxiety, headaches.  All other systems are reviewed and negative.    PHYSICAL EXAM: VS:  BP 96/70 mmHg  Pulse 68  Ht 5\' 3"  (1.6 m)  Wt 163 lb 12.8 oz (74.299 kg)  BMI 29.02 kg/m2 , BMI Body mass index is 29.02 kg/(m^2). GEN: Well nourished, well developed, in no acute distress HEENT: normal Neck: no JVD, no masses. No carotid bruits Cardiac: RRR without murmur or gallop                Respiratory:  clear to auscultation bilaterally, normal work of breathing GI: soft, nontender, nondistended, + BS MS: no deformity or atrophy Ext: no pretibial edema, pedal pulses 2+= bilaterally Skin: warm and dry, no rash Neuro:  Strength and sensation are intact Psych: euthymic mood, full affect  EKG:  EKG is ordered today. The ekg ordered today shows NSR 68 bpm, low voltage QRS, anteroseptal infarct age-undetermined  Recent Labs: 12/14/2014: BUN 10; Creatinine, Ser 0.78; Hemoglobin 12.4; Platelets 248.0; Potassium 3.9; Pro B Natriuretic peptide (BNP) 35.0; Sodium 138   Lipid Panel     Component Value Date/Time   CHOL 117 04/04/2010 0935   TRIG 151.0* 04/04/2010 0935   HDL 31.70* 04/04/2010 0935   CHOLHDL 4 04/04/2010 0935   VLDL  30.2 04/04/2010 0935   LDLCALC 55 04/04/2010 0935   LDLDIRECT 71.4 09/17/2009 1044      Wt Readings from Last 3 Encounters:  03/05/15 163 lb 12.8 oz (74.299 kg)  12/14/14 163 lb (73.936 kg)  09/07/14 159 lb 3.2 oz (72.213 kg)     Cardiac Studies Reviewed: 2D Echo 12-22-2014: Left ventricle: The cavity size was moderately dilated. Systolic function was moderately reduced. The estimated ejection fraction was in the range of 35% to 40%. Regional wall motion abnormalities:  Akinesis of the mid-apicalanteroseptal and apical myocardium. Doppler parameters are consistent with abnormal  left ventricular relaxation (grade 1 diastolic dysfunction).  ------------------------------------------------------------------- Aortic valve:  Structurally normal valve.  Cusp separation was normal. Doppler: Transvalvular velocity was within the normal range. There was no stenosis. There was no regurgitation.  ------------------------------------------------------------------- Aorta: Aortic root: The aortic root was normal in size. Ascending aorta: The ascending aorta was normal in size.  ------------------------------------------------------------------- Mitral valve:  Structurally normal valve.  Leaflet separation was normal. Doppler: Transvalvular velocity was within the normal range. There was no evidence for stenosis. There was no regurgitation.  ------------------------------------------------------------------- Left atrium: The atrium was normal in size.  ------------------------------------------------------------------- Right ventricle: The cavity size was normal. Pacer wire or catheter noted in right ventricle. Systolic function was normal.  ------------------------------------------------------------------- Pulmonic valve:  The valve appears to be grossly normal. Doppler: There was no significant  regurgitation.  ------------------------------------------------------------------- Tricuspid valve:  Structurally normal valve.  Leaflet separation was normal. Doppler: Transvalvular velocity was within the normal range. There was trivial regurgitation.  ------------------------------------------------------------------- Right atrium: The atrium was normal in size.  ------------------------------------------------------------------- Pericardium: There was no pericardial effusion.  ------------------------------------------------------------------- Systemic veins: Inferior vena cava: The vessel was normal in size. The respirophasic diameter changes were in the normal range (= 50%), consistent with normal central venous pressure. Diameter: 13.6 mm.   ASSESSMENT AND PLAN: 1.  Chronic systolic heart failure, NYHA Class II. Pt on carvedilol, aldactone, digoxin, valsartan. She is stable and volume status looks good. Will see back in 6 months with labs before that visit.  2. 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 Severe ischemic cardiomyopathy s/p ICD - stable. Followed by Dr Rayann Heman.  Current medicines are reviewed with the patient today.  The patient does not have concerns regarding medicines.  Labs/ tests ordered today include:   Orders Placed This Encounter  Procedures  . Basic Metabolic Panel (BMET)  . B Nat Peptide  . EKG 12-Lead    Disposition:   FU 6 months with a BMET and BNP prior to the visit  Signed, Sherren Mocha, MD  03/05/2015 10:07 AM    [address], [place], [place]  [address] Phone: [phone]; Fax: [phone]

## 2015-03-31 ENCOUNTER — Telehealth: Payer: Self-pay | Admitting: Cardiology

## 2015-03-31 ENCOUNTER — Ambulatory Visit (INDEPENDENT_AMBULATORY_CARE_PROVIDER_SITE_OTHER): Payer: BLUE CROSS/BLUE SHIELD

## 2015-03-31 DIAGNOSIS — Z95810 Presence of automatic (implantable) cardiac defibrillator: Secondary | ICD-10-CM

## 2015-03-31 DIAGNOSIS — I255 Ischemic cardiomyopathy: Secondary | ICD-10-CM | POA: Diagnosis not present

## 2015-03-31 NOTE — Telephone Encounter (Signed)
Spoke with pt and reminded pt of remote transmission that is due today. Pt verbalized understanding.   

## 2015-04-01 NOTE — Progress Notes (Signed)
EPIC Encounter for ICM Monitoring  Patient Name: Emily Mays is a 54 y.o. female Date: 04/01/2015 Primary Care Physican: Bonnita Nasuti, MD Primary Cardiologist: Burt Knack Electrophysiologist: Allred Dry Weight: 163 lb at office visit       In the past month, have you:  1. Gained more than 2 pounds in a day or more than 5 pounds in a week? no  2. Had changes in your medications (with verification of current medications)? Yes, Aprazolam added  3. Had more shortness of breath than is usual for you? no  4. Limited your activity because of shortness of breath? no  5. Not been able to sleep because of shortness of breath? no  6. Had increased swelling in your feet or ankles? no  7. Had symptoms of dehydration (dizziness, dry mouth, increased thirst, decreased urine output) no  8. Had changes in sodium restriction? no  9. Been compliant with medication? Yes   ICM trend: 03/31/2015   Follow-up plan: ICM clinic phone appointment 05/03/2015.  Corvue daily impedance above baseline ~03/06/2015 to 03/15/2015 and 03/21/2015 to 03/28/2015 and she stated she has had UTI.  She has taken antibiotics and thinks it may be resolved.  Education given to increase fluids if she has UTI.  Daily impedance below baseline ~03/16/2015 to 03/21/2015 but could not relate to any HF symptoms.  Education given to limit salt intake to 2000 mg daily and fluid intake 64 oz.  She stated she thinks stress has contributed to imbalance of fluid levels.  She works 7pm to [employer] which is difficult for her to sleep.  She was prescribed Aprazolam to help with sleep.   Copy of note sent to patient's primary care physician, primary cardiologist, and device following physician.  [name], RN, CCM 04/01/2015 2:43 PM

## 2015-04-16 ENCOUNTER — Telehealth: Payer: Self-pay | Admitting: Cardiology

## 2015-04-16 NOTE — Telephone Encounter (Signed)
Spoke w/ pt and requested that she send a manual transmission w/ her home monitor b/c we have not received one in at least 8 days. She verbalized understanding

## 2015-05-03 ENCOUNTER — Telehealth: Payer: Self-pay | Admitting: Cardiology

## 2015-05-03 ENCOUNTER — Ambulatory Visit (INDEPENDENT_AMBULATORY_CARE_PROVIDER_SITE_OTHER): Payer: BLUE CROSS/BLUE SHIELD

## 2015-05-03 DIAGNOSIS — Z95810 Presence of automatic (implantable) cardiac defibrillator: Secondary | ICD-10-CM | POA: Diagnosis not present

## 2015-05-03 NOTE — Telephone Encounter (Signed)
Spoke with pt and reminded pt of remote transmission that is due today. Pt verbalized understanding.   

## 2015-05-05 NOTE — Progress Notes (Signed)
EPIC Encounter for ICM Monitoring  Patient Name: Emily Mays is a 54 y.o. female Date: 05/05/2015 Primary Care Physican: Bonnita Nasuti, MD Primary Cardiologist: Burt Knack Electrophysiologist: Allred Dry Weight: 165 lb       In the past month, have you:  1. Gained more than 2 pounds in a day or more than 5 pounds in a week? no  2. Had changes in your medications (with verification of current medications)? no  3. Had more shortness of breath than is usual for you? no  4. Limited your activity because of shortness of breath? no  5. Not been able to sleep because of shortness of breath? no  6. Had increased swelling in your feet or ankles? no  7. Had symptoms of dehydration (dizziness, dry mouth, increased thirst, decreased urine output) no  8. Had changes in sodium restriction? no  9. Been compliant with medication? Yes   ICM trend: 3 month view  ICM Trend:  1 year view    Follow-up plan: ICM clinic phone appointment on 06/07/2015.  Corvue daily impedance below baseline 04/21/2015 to 05/01/2015 suggesting fluid retention.  She denied any HF symptoms. Impedance above baseline 05/02/2015 to 05/03/2015 suggesting dryness.  She stated she has had a tooth ache last few days and not drinking as much fluids as usual. She was unsure why she was retaining fluid between the dates 12/7 to 12/17.   Education given to follow low salt diet and limit fluids to 64 oz daily.  She stated she may have been eating foods with more salt due to holiday and her birthday.  No changes today.   Copy of note sent to patient's primary care physician, primary cardiologist, and device following physician.  [name], RN, CCM 05/05/2015 2:31 PM

## 2015-05-20 ENCOUNTER — Telehealth: Payer: Self-pay | Admitting: Internal Medicine

## 2015-05-20 NOTE — Telephone Encounter (Signed)
New message      Pt states sometimes it feels like her heart is "speeding" up then it will "slow back down".  Is this anything to be concerned about?  She took a xanax and went to bed.  When she woke up, she was fine.  Please advise

## 2015-05-20 NOTE — Telephone Encounter (Addendum)
Returned a call to patient and she says she felt her heart beating fast and up into  throat while at work. She says she has not felt well for the last month.  She says she feels it is all do to stress and she does not handle stress well.  The holidays are difficult for her after the death of her nephew last year.  She is going to continue to monitor her symptoms and if they continue she will let me know.

## 2015-06-04 ENCOUNTER — Other Ambulatory Visit: Payer: Self-pay

## 2015-06-04 MED ORDER — CARVEDILOL 25 MG PO TABS: ORAL_TABLET | ORAL | Status: DC

## 2015-06-09 ENCOUNTER — Telehealth: Payer: Self-pay

## 2015-06-09 NOTE — Telephone Encounter (Signed)
ICM call to patient.  Advised remote ICM transmission was not received on 06/07/2015 and requested to send a manual transmission.  She stated she would do that today.

## 2015-06-11 NOTE — Telephone Encounter (Signed)
No remote ICM transmission received as scheduled on 06/07/2015.  New remote ICM transmission scheduled for 06/28/2015 and letter sent with new date.

## 2015-06-15 ENCOUNTER — Telehealth: Payer: Self-pay

## 2015-06-15 NOTE — Telephone Encounter (Signed)
Received remote ICM transmission on 06/12/2015 instead of scheduled date of 06/07/2015.  Attempted call to patient regarding 06/12/2015 transmission and no answer or answering machine.  New transmission date already scheduled for 06/28/2015 and letter has been sent.

## 2015-06-28 ENCOUNTER — Ambulatory Visit (INDEPENDENT_AMBULATORY_CARE_PROVIDER_SITE_OTHER): Payer: BLUE CROSS/BLUE SHIELD

## 2015-06-28 ENCOUNTER — Telehealth: Payer: Self-pay | Admitting: Cardiology

## 2015-06-28 DIAGNOSIS — I5022 Chronic systolic (congestive) heart failure: Secondary | ICD-10-CM

## 2015-06-28 DIAGNOSIS — Z95810 Presence of automatic (implantable) cardiac defibrillator: Secondary | ICD-10-CM | POA: Diagnosis not present

## 2015-06-28 NOTE — Telephone Encounter (Signed)
Spoke with pt and reminded pt of remote transmission that is due today. Pt verbalized understanding.   

## 2015-06-29 NOTE — Progress Notes (Signed)
EPIC Encounter for ICM Monitoring  Patient Name: Emily Mays is a 55 y.o. female Date: 06/29/2015 Primary Care Physican: Bonnita Nasuti, MD Primary Cardiologist: Burt Knack Electrophysiologist: Allred Dry Weight: 165 lbs       In the past month, have you:  1. Gained more than 2 pounds in a day or more than 5 pounds in a week? no  2. Had changes in your medications (with verification of current medications)? no  3. Had more shortness of breath than is usual for you? no  4. Limited your activity because of shortness of breath? no  5. Not been able to sleep because of shortness of breath? no  6. Had increased swelling in your feet or ankles? no  7. Had symptoms of dehydration (dizziness, dry mouth, increased thirst, decreased urine output) no  8. Had changes in sodium restriction? no  9. Been compliant with medication? Yes   ICM trend: 3 month view for 06/28/2015   ICM trend: 1 year view for 06/28/2015   Follow-up plan: ICM clinic phone appointment 08/03/2015.   Corvue thoracic impedance below reference line 06/03/2015 to 06/09/2015 suggesting fluid retention and she denied any symptoms during that time.  Thoracic impedance above reference line from 06/11/2015 to 06/15/2015, 06/18/2015 to 06/20/2015, 06/25/2015 to 06/28/2015 suggesting dryness.  She reported she has not been drinking enough fluids because she has been sick with cold.  She has been on 2 different antibiotics.  Encouraged to drink up to 64 oz fluid daily to stay hydrated.  No changes today.    Copy of note sent to patient's primary care physician, primary cardiologist, and device following physician.  [name], RN, CCM 06/29/2015 11:33 AM

## 2015-08-03 ENCOUNTER — Ambulatory Visit (INDEPENDENT_AMBULATORY_CARE_PROVIDER_SITE_OTHER): Payer: BLUE CROSS/BLUE SHIELD

## 2015-08-03 DIAGNOSIS — I5022 Chronic systolic (congestive) heart failure: Secondary | ICD-10-CM | POA: Diagnosis not present

## 2015-08-03 DIAGNOSIS — Z95810 Presence of automatic (implantable) cardiac defibrillator: Secondary | ICD-10-CM | POA: Diagnosis not present

## 2015-08-03 NOTE — Progress Notes (Signed)
EPIC Encounter for ICM Monitoring  Patient Name: Emily Mays is a 55 y.o. female Date: 08/03/2015 Primary Care Physican: Bonnita Nasuti, MD Primary Cardiologist: Burt Knack Electrophysiologist: Allred Dry Weight: 163 lbs a week ago   In the past month, have you:  1. Gained more than 2 pounds in a day or more than 5 pounds in a week? No, education to weigh daily at the same time.  Patient stated she works 3rd shift and hard to weigh daily.    2. Had changes in your medications (with verification of current medications)? no  3. Had more shortness of breath than is usual for you? no  4. Limited your activity because of shortness of breath? no  5. Not been able to sleep because of shortness of breath? no  6. Had increased swelling in your feet or ankles? no  7. Had symptoms of dehydration (dizziness, dry mouth, increased thirst, decreased urine output) no  8. Had changes in sodium restriction? no  9. Been compliant with medication? Yes   ICM trend: 3 month view for 08/03/2015   ICM trend: 1 year view for 08/03/2015   Follow-up plan: ICM clinic phone appointment on 08/13/2015.  Thoracic impedance below reference line from 07/01/2015 to 07/12/2015 and 07/29/2015 to 08/03/2015 suggesting fluid accumulation.  Patient has symptoms of hand swelling and slight increase in shortness of breath.   Education given to limit sodium intake to < 2000 mg and fluid intake to 64 oz daily.    Labs  12/14/2014 Creatinine 0.78, BUN 10, Potassium 3.9 05/16/2013 Creatinine 0.67, BUN 15, Potassium 4.2  Advised to increase Furosemide to 20 mg bid x 3 days.    Advised would send to Dr Burt Knack and Dr Rayann Heman for review.  Will call back if any further recommendations.     Copy of note sent to patient's primary care physician, primary cardiologist, and device following physician.  [name], RN, CCM 08/03/2015 11:28 AM

## 2015-08-13 ENCOUNTER — Ambulatory Visit (INDEPENDENT_AMBULATORY_CARE_PROVIDER_SITE_OTHER): Payer: BLUE CROSS/BLUE SHIELD

## 2015-08-13 ENCOUNTER — Telehealth: Payer: Self-pay | Admitting: Cardiology

## 2015-08-13 DIAGNOSIS — Z95810 Presence of automatic (implantable) cardiac defibrillator: Secondary | ICD-10-CM

## 2015-08-13 DIAGNOSIS — I5022 Chronic systolic (congestive) heart failure: Secondary | ICD-10-CM

## 2015-08-13 NOTE — Telephone Encounter (Signed)
Spoke with pt and reminded pt of remote transmission that is due today. Pt verbalized understanding.   

## 2015-08-13 NOTE — Progress Notes (Signed)
EPIC Encounter for ICM Monitoring  Patient Name: Emily Mays is a 55 y.o. female Date: 08/13/2015 Primary Care Physican: Bonnita Nasuti, MD Primary Cardiologist: Burt Knack Electrophysiologist: Allred Dry Weight: 161 lbs   In the past month, have you:  1. Gained more than 2 pounds in a day or more than 5 pounds in a week? no  2. Had changes in your medications (with verification of current medications)? no  3. Had more shortness of breath than is usual for you? no  4. Limited your activity because of shortness of breath? no  5. Not been able to sleep because of shortness of breath? no  6. Had increased swelling in your feet or ankles? no  7. Had symptoms of dehydration (dizziness, dry mouth, increased thirst, decreased urine output) no  8. Had changes in sodium restriction? no  9. Been compliant with medication? Yes   ICM trend: 3 month view for 08/13/2015   ICM trend: 1 year view for 08/13/2015   Follow-up plan: ICM clinic phone appointment on 09/15/2015.  Thoracic impedance returned to reference line after increase in Furosemide x 3 days on 07/14/2015 to 08/06/2015.  She did have thoracic impedance below reference line from 08/08/2015 to 2/29/2017 and returned to reference line 08/13/2015.   Patient reported symptoms of SOB, weight gain and hand swelling have improved.  She stated she is doing fine right now.  Reminded to limit sodium intake to < 2000 mg and fluid intake to 64 oz daily.  Encouraged to call for any fluid symptoms.  No changes today.    [name], RN, CCM 08/13/2015 2:01 PM

## 2015-09-06 ENCOUNTER — Encounter: Payer: Self-pay | Admitting: Internal Medicine

## 2015-09-06 ENCOUNTER — Ambulatory Visit (INDEPENDENT_AMBULATORY_CARE_PROVIDER_SITE_OTHER): Payer: BLUE CROSS/BLUE SHIELD | Admitting: Internal Medicine

## 2015-09-06 VITALS — BP 140/80 | HR 94 | Ht 64.0 in | Wt 161.8 lb

## 2015-09-06 DIAGNOSIS — I5022 Chronic systolic (congestive) heart failure: Secondary | ICD-10-CM

## 2015-09-06 DIAGNOSIS — I255 Ischemic cardiomyopathy: Secondary | ICD-10-CM | POA: Diagnosis not present

## 2015-09-06 NOTE — Patient Instructions (Signed)
Medication Instructions:  Your physician recommends that you continue on your current medications as directed. Please refer to the Current Medication list given to you today.   Labwork: None ordered   Testing/Procedures: None ordered   Follow-Up: Your physician wants you to follow-up in: 12 months with Dr Rayann Heman Dennis Bast will receive a reminder letter in the mail two months in advance. If you don't receive a letter, please call our office to schedule the follow-up appointment.  Remote monitoring is used to monitor your ICD from home. This monitoring reduces the number of office visits required to check your device to one time per year. It allows [place] to keep an eye on the functioning of your device to ensure it is working properly. You are scheduled for a device check from home on 12/06/15. You may send your transmission at any time that day. If you have a wireless device, the transmission will be sent automatically. After your physician reviews your transmission, you will receive a postcard with your next transmission date.    Any Other Special Instructions Will Be Listed Below (If Applicable).     If you need a refill on your cardiac medications before your next appointment, please call your pharmacy.

## 2015-09-06 NOTE — Progress Notes (Signed)
Electrophysiology Office Note   Date:  09/06/2015   ID:  [name], DOB [birth year], MRN [id number]  PCP:  Bonnita Nasuti, MD  Cardiologist:  Dr Burt Knack Primary Electrophysiologist: Thompson Grayer, MD    Chief Complaint  Patient presents with  . Follow-up  . Shortness of Breath    with any walking even leisure     History of Present Illness: Emily Mays is a 55 y.o. female who presents today for electrophysiology evaluation.   She is doing well.  Continues to work. She has occasional SOB.  Rare palpitations, worse with anxiety.  Today, she denies symptoms of  orthopnea, PND, lower extremity edema, claudication, dizziness, presyncope, syncope, bleeding, or neurologic sequela. The patient is tolerating medications without difficulties and is otherwise without complaint today.    Past Medical History  Diagnosis Date  . Anemia   . Other specified forms of chronic ischemic heart disease     echo 07/26/2008 EfF 30% with perapical HK  . Hypokalemia   . Unspecified essential hypertension   . Esophageal reflux   . Paralytic ileus ([place])   . CAD (coronary artery disease)     spontaneous LAD dissection presenting with anterior STEMI. s/p MI 07/24/08 with PCI Endeavor DES to Prox. LAD and BMA to prox. LCX.. relook cath 07/28/08 showing cont. patency of LAD to LCX.  Marland Kitchen Chronic systolic HF (heart failure) ([place])   . DDD (degenerative disc disease)   . GERD (gastroesophageal reflux disease)    Past Surgical History  Procedure Laterality Date  . Cholecystectomy      27 years ago  . Cardiac catheterization  March 2010    2 cardiac stent placement in March 2010.   . Cardiac defibrillator placement  december 2010    [place]  . Abdominal hysterectomy    . Bilateral salpingoophorectomy    . Total abdominal hysterectomy w/ bilateral salpingoophorectomy    . Left pelvic lymphadenectomy      by Dr. Marti Sleigh   . Implantable cardioverter defibrillator generator change N/A  05/16/2013    Generator change (SJM Fortify Assura VR) for prior device malfunction (capacitors unable to charge)     Current Outpatient Prescriptions  Medication Sig Dispense Refill  . ALPRAZolam (XANAX) 1 MG tablet Take 1 tablet by mouth as directed. Takes as needed for anxiety    . aspirin EC 81 MG tablet Take 81 mg by mouth daily.    . carvedilol (COREG) 25 MG tablet TAKE 1 TABLET (25 MG TOTAL) BY MOUTH 2 (TWO) TIMES DAILY WITH A MEAL. 180 tablet 1  . clopidogrel (PLAVIX) 75 MG tablet Take 1 tablet (75 mg total) by mouth at bedtime. 90 tablet 3  . DIGOX 125 MCG tablet Take 1 tablet by mouth daily.    Marland Kitchen FARXIGA 10 MG TABS tablet Take 10 mg by mouth daily.    . furosemide (LASIX) 20 MG tablet Take 1 tablet (20 mg total) by mouth daily. 30 tablet 6  . glipiZIDE (GLUCOTROL XL) 10 MG 24 hr tablet Take 1 tablet by mouth as directed.    . nitroGLYCERIN (NITROSTAT) 0.4 MG SL tablet Place 0.4 mg under the tongue every 5 (five) minutes as needed for chest pain (up to 3 doses MAX).    . ONE TOUCH ULTRA TEST test strip     . oxyCODONE-acetaminophen (PERCOCET) 10-325 MG per tablet Take 1 tablet by mouth every 8 (eight) hours as needed for pain.     . pantoprazole (  PROTONIX) 40 MG tablet Take 40 mg by mouth daily.     . sitaGLIPtan-metformin (JANUMET) 50-1000 MG per tablet Take 1 tablet by mouth 2 (two) times daily with a meal.     . spironolactone (ALDACTONE) 25 MG tablet Take 0.5 tablets (12.5 mg total) by mouth daily. 45 tablet 1  . valsartan (DIOVAN) 40 MG tablet Take 1 tablet (40 mg total) by mouth daily. 90 tablet 3   No current facility-administered medications for this visit.    Allergies:   Duloxetine hcl and Penicillins   Social History:  The patient  reports that she has quit smoking. She does not have any smokeless tobacco history on file. She reports that she drinks alcohol.   Family History:  The patient's family history includes Breast cancer in her paternal aunt; CAD in her mother;  Cancer in her father; Kidney cancer in her paternal aunt; Other (age of onset: 11) in her father; Throat cancer in her paternal uncle.    ROS:  Please see the history of present illness.   All other systems are reviewed and negative.    PHYSICAL EXAM: VS:  BP 140/80 mmHg  Pulse 94  Ht 5\' 4"  (1.626 m)  Wt 161 lb 12.8 oz (73.392 kg)  BMI 27.76 kg/m2  SpO2 96% , BMI Body mass index is 27.76 kg/(m^2). GEN: Well nourished, well developed, in no acute distress HEENT: normal Neck: no JVD, carotid bruits, or masses Cardiac: RRR; no murmurs, rubs, or gallops,no edema  Respiratory:  clear to auscultation bilaterally, normal work of breathing GI: soft, nontender, nondistended, + BS MS: no deformity or atrophy Skin: warm and dry, device pocket is well healed Neuro:  Strength and sensation are intact Psych: euthymic mood, full affect  Device interrogation is reviewed today in detail.  See PaceArt for details.   Recent Labs: 12/14/2014: BUN 10; Creatinine, Ser 0.78; Hemoglobin 12.4; Platelets 248.0; Potassium 3.9; Pro B Natriuretic peptide (BNP) 35.0; Sodium 138    Lipid Panel     Component Value Date/Time   CHOL 117 04/04/2010 0935   TRIG 151.0* 04/04/2010 0935   HDL 31.70* 04/04/2010 0935   CHOLHDL 4 04/04/2010 0935   VLDL 30.2 04/04/2010 0935   LDLCALC 55 04/04/2010 0935   LDLDIRECT 71.4 09/17/2009 1044     Wt Readings from Last 3 Encounters:  09/06/15 161 lb 12.8 oz (73.392 kg)  03/05/15 163 lb 12.8 oz (74.299 kg)  12/14/14 163 lb (73.936 kg)      Other studies Reviewed: Additional studies/ records that were reviewed today include: Dr York Cerise notes    ASSESSMENT AND PLAN:   1. Ischemic CM Normal ICD function See Pace Art report No changes today Merlin Followed in ICM clinic I have discussed SJM Fortify Assura advisary with the patient today. She understands that recommendation from SJM is to not replace the device at this time. The patient is not device  dependant.   Vibratory alert demonstrated today.  She is actively remotely monitored and understands the importance of compliance today.   2. HTN Stable No change required today  merlin Return to see EP NP in 1 year Follow-up with Dr Burt Knack as scheduled   Current medicines are reviewed at length with the patient today.   The patient does not have concerns regarding her medicines.  The following changes were made today:  none  Signed, Thompson Grayer, MD  09/06/2015 12:25 PM     [facility] [address]  [address] [phone] (office) [phone] (fax)

## 2015-09-07 LAB — CUP PACEART INCLINIC DEVICE CHECK
Battery Remaining Longevity: 85.2
Brady Statistic RV Percent Paced: 0 %
Date Time Interrogation Session: 20170424161902
HIGH POWER IMPEDANCE MEASURED VALUE: 75.375
Lead Channel Impedance Value: 637.5 Ohm
Lead Channel Pacing Threshold Amplitude: 1 V
Lead Channel Sensing Intrinsic Amplitude: 12 mV
Lead Channel Setting Pacing Pulse Width: 0.5 ms
Lead Channel Setting Sensing Sensitivity: 0.5 mV
MDC IDC LEAD IMPLANT DT: 20101228
MDC IDC LEAD LOCATION: 753860
MDC IDC LEAD MODEL: 7122
MDC IDC MSMT LEADCHNL RV PACING THRESHOLD AMPLITUDE: 1 V
MDC IDC MSMT LEADCHNL RV PACING THRESHOLD PULSEWIDTH: 0.5 ms
MDC IDC MSMT LEADCHNL RV PACING THRESHOLD PULSEWIDTH: 0.5 ms
MDC IDC SET LEADCHNL RV PACING AMPLITUDE: 2.5 V
Pulse Gen Serial Number: 7152745

## 2015-09-15 ENCOUNTER — Telehealth: Payer: Self-pay

## 2015-09-15 ENCOUNTER — Ambulatory Visit (INDEPENDENT_AMBULATORY_CARE_PROVIDER_SITE_OTHER): Payer: BLUE CROSS/BLUE SHIELD

## 2015-09-15 DIAGNOSIS — I5022 Chronic systolic (congestive) heart failure: Secondary | ICD-10-CM | POA: Diagnosis not present

## 2015-09-15 DIAGNOSIS — Z95810 Presence of automatic (implantable) cardiac defibrillator: Secondary | ICD-10-CM | POA: Diagnosis not present

## 2015-09-15 NOTE — Telephone Encounter (Signed)
Call to patient and requested to send a manual transmission today.  She stated she would do so.

## 2015-09-16 ENCOUNTER — Telehealth: Payer: Self-pay

## 2015-09-16 NOTE — Telephone Encounter (Signed)
error 

## 2015-09-16 NOTE — Telephone Encounter (Signed)
Attempted call to patient and she was sleeping.  Will attempted call back.

## 2015-09-16 NOTE — Progress Notes (Signed)
EPIC Encounter for ICM Monitoring  Patient Name: Emily Mays is a 55 y.o. female Date: 09/16/2015 Primary Care Physican: Bonnita Nasuti, MD Primary Cardiologist: Burt Knack Electrophysiologist: Allred Dry Weight: 162 lb       In the past month, have you:  1. Gained more than 2 pounds in a day or more than 5 pounds in a week? Fluctuates 2 lbs up   2. Had changes in your medications (with verification of current medications)? no  3. Had more shortness of breath than is usual for you? no  4. Limited your activity because of shortness of breath? no  5. Not been able to sleep because of shortness of breath? no  6. Had increased swelling in your feet or ankles? no  7. Had symptoms of dehydration (dizziness, dry mouth, increased thirst, decreased urine output) no  8. Had changes in sodium restriction? no  9. Been compliant with medication? Yes  ICM trend: 3 month view for 09/15/2015  ICM trend: 1 year view for 09/15/2015   Follow-up plan: ICM clinic phone appointment 09/23/2015 and office appointment with Dr Burt Knack 10/29/2015.    FLUID LEVELS: Corvue thoracic impedance decreased 09/14/2015 to 09/15/2015 suggesting fluid accumulation.    SYMPTOMS:    Denied any fluid symptoms at this time but weight has fluctuated by 2 lbs this week.    Recommended to increase Furosemide 20 mg to bid x 3 days and then return to prescribed dosage after 3rd day.  Repeat transmission 09/23/2015.   Advised will send to Dr. Burt Knack and Dr. Rayann Heman for review and will call back if any recommendations.   [name], RN, CCM 09/16/2015 9:51 AM

## 2015-09-23 ENCOUNTER — Ambulatory Visit (INDEPENDENT_AMBULATORY_CARE_PROVIDER_SITE_OTHER): Payer: BLUE CROSS/BLUE SHIELD

## 2015-09-23 ENCOUNTER — Telehealth: Payer: Self-pay

## 2015-09-23 DIAGNOSIS — Z95810 Presence of automatic (implantable) cardiac defibrillator: Secondary | ICD-10-CM

## 2015-09-23 DIAGNOSIS — I5022 Chronic systolic (congestive) heart failure: Secondary | ICD-10-CM

## 2015-09-23 NOTE — Telephone Encounter (Signed)
Spoke with patient and requested to send ICM remote transmission today to recheck fluid levels.   She stated she would do so and I could call her tomorrow morning with the results.

## 2015-09-24 NOTE — Telephone Encounter (Signed)
This encounter was created in error - please disregard.

## 2015-09-24 NOTE — Addendum Note (Signed)
Addended by: [name] on: 09/24/2015 10:29 AM   Modules accepted: Level of Service, SmartSet

## 2015-09-24 NOTE — Telephone Encounter (Signed)
error 

## 2015-09-24 NOTE — Progress Notes (Signed)
EPIC Encounter for ICM Monitoring  Patient Name: Emily Mays is a 55 y.o. female Date: 09/24/2015 Primary Care Physican: Bonnita Nasuti, MD Primary Cardiologist: Burt Knack Electrophysiologist: Allred Dry Weight: unknown   In the past month, have you:  1. Gained more than 2 pounds in a day or more than 5 pounds in a week? N/A  2. Had changes in your medications (with verification of current medications)? N/A  3. Had more shortness of breath than is usual for you? N/A  4. Limited your activity because of shortness of breath? N/A  5. Not been able to sleep because of shortness of breath? N/A  6. Had increased swelling in your feet or ankles? N/A  7. Had symptoms of dehydration (dizziness, dry mouth, increased thirst, decreased urine output) N/A  8. Had changes in sodium restriction? N/A  9. Been compliant with medication? N/A   ICM trend: 3 month view for 09/23/2015   ICM trend: 1 year view for 09/23/2015   Follow-up plan: ICM clinic phone appointment on  09/30/2015.  Attempted call to patient and unable to reach.  Transmission reviewed.  Since last ICM transmission on 09/15/2015, thoracic impedance returned to baseline after 3 days of increased Furosemide and has returned to below baseline from 09/21/2015 to 09/23/2015 suggesting return of fluid accumulation.    Call to Dr Dwana Curd office and requested to fax results of latest labs. She stated the latest labs are from 08/2015 and she will fax them.     Copy of note sent to patient's primary care physician, primary cardiologist, and device following physician.  [name], RN, CCM 09/24/2015 10:33 AM

## 2015-09-27 NOTE — Progress Notes (Signed)
Received following lab results from Dr Dwana Curd office.   09/03/2015 Creatinine 0.78, Potassium 3.7, Sodium 137, eGFR 101 05/21/2015 Creatinine 0.78, Potassium 3.7, Sodium 138, eGFR 101

## 2015-09-30 ENCOUNTER — Ambulatory Visit (INDEPENDENT_AMBULATORY_CARE_PROVIDER_SITE_OTHER): Payer: BLUE CROSS/BLUE SHIELD

## 2015-09-30 DIAGNOSIS — I5022 Chronic systolic (congestive) heart failure: Secondary | ICD-10-CM

## 2015-09-30 DIAGNOSIS — Z95810 Presence of automatic (implantable) cardiac defibrillator: Secondary | ICD-10-CM

## 2015-10-04 NOTE — Progress Notes (Signed)
EPIC Encounter for ICM Monitoring  Patient Name: Emily Mays is a 55 y.o. female Date: 10/04/2015 Primary Care Physican: Bonnita Nasuti, MD Primary Cardiologist: Burt Knack Electrophysiologist: Allred Dry Weight: unknown      In the past month, have you:  1. Gained more than 2 pounds in a day or more than 5 pounds in a week? no  2. Had changes in your medications (with verification of current medications)? no  3. Had more shortness of breath than is usual for you? no  4. Limited your activity because of shortness of breath? no  5. Not been able to sleep because of shortness of breath? no  6. Had increased swelling in your feet, ankles, legs or stomach area? no  7. Had symptoms of dehydration (dizziness, dry mouth, increased thirst, decreased urine output) no  8. Had changes in sodium restriction? no  9. Been compliant with medication? Yes  ICM trend: 3 month view for 10/02/2015   ICM trend: 1 year view for 10/02/2015   Follow-up plan: ICM clinic phone appointment 11/04/2015.    FLUID LEVELS: Since last ICM transmission 09/23/2015, Corvue thoracic impedance has returned to baseline suggesting stable fluid levels.    SYMPTOMS:   None.  Denied any symptoms such as weight gain of 3 pounds overnight or 5 pounds within a week, SOB and/or lower extremity swelling.  Encouraged to call for any fluid symptoms.   RECOMMENDATIONS: No changes today.    [name], RN, CCM 10/04/2015 2:37 PM

## 2015-10-25 ENCOUNTER — Other Ambulatory Visit (INDEPENDENT_AMBULATORY_CARE_PROVIDER_SITE_OTHER): Payer: BLUE CROSS/BLUE SHIELD | Admitting: *Deleted

## 2015-10-25 ENCOUNTER — Encounter: Payer: Self-pay | Admitting: Cardiovascular Disease

## 2015-10-25 ENCOUNTER — Ambulatory Visit (INDEPENDENT_AMBULATORY_CARE_PROVIDER_SITE_OTHER): Payer: BLUE CROSS/BLUE SHIELD | Admitting: Cardiovascular Disease

## 2015-10-25 VITALS — BP 112/56 | HR 76 | Ht 64.0 in | Wt 162.0 lb

## 2015-10-25 DIAGNOSIS — I5022 Chronic systolic (congestive) heart failure: Secondary | ICD-10-CM | POA: Diagnosis not present

## 2015-10-25 DIAGNOSIS — E876 Hypokalemia: Secondary | ICD-10-CM | POA: Diagnosis not present

## 2015-10-25 DIAGNOSIS — I10 Essential (primary) hypertension: Secondary | ICD-10-CM | POA: Diagnosis not present

## 2015-10-25 DIAGNOSIS — I2510 Atherosclerotic heart disease of native coronary artery without angina pectoris: Secondary | ICD-10-CM

## 2015-10-25 LAB — BASIC METABOLIC PANEL
BUN: 11 mg/dL (ref 7–25)
CHLORIDE: 102 mmol/L (ref 98–110)
CO2: 24 mmol/L (ref 20–31)
CREATININE: 0.77 mg/dL (ref 0.50–1.05)
Calcium: 9.2 mg/dL (ref 8.6–10.4)
Glucose, Bld: 236 mg/dL — ABNORMAL HIGH (ref 65–99)
POTASSIUM: 3.7 mmol/L (ref 3.5–5.3)
SODIUM: 139 mmol/L (ref 135–146)

## 2015-10-25 LAB — BRAIN NATRIURETIC PEPTIDE: Brain Natriuretic Peptide: 52.6 pg/mL (ref <100)

## 2015-10-25 NOTE — Addendum Note (Signed)
Addended by: Eulis Foster on: 10/25/2015 11:27 AM   Modules accepted: Orders

## 2015-10-25 NOTE — Patient Instructions (Signed)

## 2015-10-25 NOTE — Progress Notes (Signed)
Cardiology Office Note Date:  10/25/2015   ID:  Emily Mays, DOB 1961-02-05, MRN [id number]  PCP:  Bonnita Nasuti, MD  Cardiologist:  Sherren Mocha, MD    Chief Complaint  Patient presents with  . Shortness of Breath    History of Present Illness: Emily Mays is a 55 y.o. female who presents for CAD and chronic systolic heart failure. 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 This required extensive stenting of the LAD and left circumflex. She went on to develop a severe ischemic cardiomyopathy and was treated with an ICD for primary prevention of sudden cardiac death.  The patient is doing okay. She still has a lot of stress at work. She's working night shifts. She denies chest pain or pressure, orthopnea, PND, leg swelling. She does admit to exertional dyspnea with walking a moderate distance. No other complaints today.  Past Medical History  Diagnosis Date  . Anemia   . Other specified forms of chronic ischemic heart disease     echo 07/26/2008 EfF 30% with perapical HK  . Hypokalemia   . Unspecified essential hypertension   . Esophageal reflux   . [place]al[place] ([place]   . CAD (coronary artery disease)     spontaneous LAD dissection presenting with anterior STEMI. s/p MI 07/24/08 with PCI Endeavor DES to Prox. LAD and BMA to prox. LCX.. relook cath 07/28/08 showing cont. patency of LAD to LCX.  Marland Kitchen Chronic systolic HF (heart failure) ([place])   . DDD (degenerative disc disease)   . GERD (gastroesophageal reflux disease)     Past Surgical History  Procedure Laterality Date  . Cholecystectomy      27 years ago  . Cardiac catheterization  March 2010    2 cardiac stent placement in March 2010.   . Cardiac defibrillator placement  december 2010    [place]  . Abdominal hysterectomy    . Bilateral salpingoophorectomy    . Total abdominal hysterectomy w/ bilateral salpingoophorectomy    . Left pelvic lymphadenectomy      by Dr. Marti Sleigh   . Implantable cardioverter defibrillator generator change N/A 05/16/2013    Generator change (SJM Fortify Assura VR) for prior device malfunction (capacitors unable to charge)    Current Outpatient Prescriptions  Medication Sig Dispense Refill  . ALPRAZolam (XANAX) 1 MG tablet Take 1 tablet by mouth as directed. Takes as needed for anxiety    . aspirin EC 81 MG tablet Take 81 mg by mouth daily.    . carvedilol (COREG) 25 MG tablet TAKE 1 TABLET (25 MG TOTAL) BY MOUTH 2 (TWO) TIMES DAILY WITH A MEAL. 180 tablet 1  . clopidogrel (PLAVIX) 75 MG tablet Take 1 tablet (75 mg total) by mouth at bedtime. 90 tablet 3  . DIGOX 125 MCG tablet Take 1 tablet by mouth daily.    Marland Kitchen FARXIGA 10 MG TABS tablet Take 10 mg by mouth daily.    . furosemide (LASIX) 20 MG tablet Take 1 tablet (20 mg total) by mouth daily. 30 tablet 6  . glipiZIDE (GLUCOTROL XL) 10 MG 24 hr tablet Take 1 tablet by mouth as directed.    . nitroGLYCERIN (NITROSTAT) 0.4 MG SL tablet Place 0.4 mg under the tongue every 5 (five) minutes as needed for chest pain (up to 3 doses MAX).    . ONE TOUCH ULTRA TEST test strip     . oxyCODONE-acetaminophen (PERCOCET) 10-325 MG per tablet Take 1  tablet by mouth every 8 (eight) hours as needed for pain.     . pantoprazole (PROTONIX) 40 MG tablet Take 40 mg by mouth daily.     . sitaGLIPtan-metformin (JANUMET) 50-1000 MG per tablet Take 1 tablet by mouth 2 (two) times daily with a meal.     . spironolactone (ALDACTONE) 25 MG tablet Take 0.5 tablets (12.5 mg total) by mouth daily. 45 tablet 1  . valsartan (DIOVAN) 40 MG tablet Take 1 tablet (40 mg total) by mouth daily. 90 tablet 3   No current facility-administered medications for this visit.    Allergies:   Duloxetine hcl and Penicillins   Social History:  The patient  reports that she has quit smoking. She does not have any smokeless tobacco history on file. She reports that she drinks alcohol.   Family History:  The patient's   family history includes Breast cancer in her paternal aunt; CAD in her mother; Cancer in her father; Kidney cancer in her paternal aunt; Other (age of onset: 63) in her father; Throat cancer in her paternal uncle.   ROS:  Please see the history of present illness.  Otherwise, review of systems is positive for Fatigue.  All other systems are reviewed and negative.   PHYSICAL EXAM: VS:  BP 112/56 mmHg  Pulse 76  Ht 5\' 4"  (1.626 m)  Wt 162 lb (73.483 kg)  BMI 27.79 kg/m2 , BMI Body mass index is 27.79 kg/(m^2). GEN: Well nourished, well developed, in no acute distress HEENT: normal Neck: no JVD, no masses. No carotid bruits Cardiac: RRR without murmur or gallop                Respiratory:  clear to auscultation bilaterally, normal work of breathing GI: soft, nontender, nondistended, + BS MS: no deformity or atrophy Ext: no pretibial edema, pedal pulses 2+= bilaterally Skin: warm and dry, no rash Neuro:  Strength and sensation are intact Psych: euthymic mood, full affect  EKG:  EKG is not ordered today.  Recent Labs: 12/14/2014: BUN 10; Creatinine, Ser 0.78; Hemoglobin 12.4; Platelets 248.0; Potassium 3.9; Pro B Natriuretic peptide (BNP) 35.0; Sodium 138   Lipid Panel     Component Value Date/Time   CHOL 117 04/04/2010 0935   TRIG 151.0* 04/04/2010 0935   HDL 31.70* 04/04/2010 0935   CHOLHDL 4 04/04/2010 0935   VLDL 30.2 04/04/2010 0935   LDLCALC 55 04/04/2010 0935   LDLDIRECT 71.4 09/17/2009 1044      Wt Readings from Last 3 Encounters:  10/25/15 162 lb (73.483 kg)  09/06/15 161 lb 12.8 oz (73.392 kg)  03/05/15 163 lb 12.8 oz (74.299 kg)     Cardiac Studies Reviewed: 2D Echo 12-22-2014: Study Conclusions  - Left ventricle: The cavity size was moderately dilated. Systolic  function was moderately reduced. The estimated ejection fraction  was in the range of 35% to 40%. Akinesis of the  mid-apicalanteroseptal and apical myocardium. Doppler parameters  are consistent  with abnormal left ventricular relaxation (grade 1  diastolic dysfunction).  ASSESSMENT AND PLAN: 1.  Chronic systolic heart failure, NYHA functional class II: Medical regimen reviewed includes carvedilol, Aldactone, digoxin, valsartan. Metabolic panel and BNP are checked today. Patient also continues on furosemide. No signs of volume overload on exam. Will see back in 6 months for follow-up evaluation.  2. CAD, native vessel, with old MI: Extensive past stenting of the LAD and left circumflex. Continue long-term dual antiplatelet therapy and an absence of bleeding problems.  3. Severe  ischemic cardiomyopathy, status post ICD: Last echo reviewed with LVEF 35-40%. Last office visit of Dr. Rayann Heman reviewed.  Current medicines are reviewed with the patient today.  The patient does not have concerns regarding medicines.  Labs/ tests ordered today include:  No orders of the defined types were placed in this encounter.    Disposition:   FU 6 months  Signed, Sherren Mocha, MD  10/25/2015 1:18 PM    [facility] [address], [place], [place]  [address] Phone: [phone]; Fax: [phone]

## 2015-10-29 ENCOUNTER — Ambulatory Visit: Payer: BLUE CROSS/BLUE SHIELD | Admitting: Cardiovascular Disease

## 2015-10-29 ENCOUNTER — Other Ambulatory Visit: Payer: BLUE CROSS/BLUE SHIELD

## 2015-11-01 ENCOUNTER — Telehealth: Payer: Self-pay | Admitting: Cardiology

## 2015-11-01 NOTE — Telephone Encounter (Signed)
Spoke w/ pt and requested that she send a manual transmission b/c her home monitor has not updated in at least 8 days.   

## 2015-11-04 ENCOUNTER — Ambulatory Visit (INDEPENDENT_AMBULATORY_CARE_PROVIDER_SITE_OTHER): Payer: BLUE CROSS/BLUE SHIELD

## 2015-11-04 ENCOUNTER — Telehealth: Payer: Self-pay | Admitting: Cardiology

## 2015-11-04 DIAGNOSIS — Z95810 Presence of automatic (implantable) cardiac defibrillator: Secondary | ICD-10-CM | POA: Diagnosis not present

## 2015-11-04 DIAGNOSIS — I5022 Chronic systolic (congestive) heart failure: Secondary | ICD-10-CM

## 2015-11-04 NOTE — Telephone Encounter (Signed)
Spoke with pt and reminded pt of remote transmission that is due today. Pt verbalized understanding.   

## 2015-11-04 NOTE — Progress Notes (Signed)
EPIC Encounter for ICM Monitoring  Patient Name: Emily Mays is a 55 y.o. female Date: 11/04/2015 Primary Care Physican: Bonnita Nasuti, MD Primary Cardiologist: Burt Knack Electrophysiologist: Allred Dry Weight: 159 lbs      In the past month, have you:  1. Gained more than 2 pounds in a day or more than 5 pounds in a week? No  2. Had changes in your medications (with verification of current medications)? No  3. Had more shortness of breath than is usual for you? No   4. Limited your activity because of shortness of breath? No   5. Not been able to sleep because of shortness of breath? No   6. Had increased swelling in your feet, ankles, legs or stomach area? No   7. Had symptoms of dehydration (dizziness, dry mouth, increased thirst, decreased urine output) No   8. Had changes in sodium restriction? No   9. Been compliant with medication? Yes   ICM trend: 3 month view for 11/04/2015   ICM trend: 1 year view for 11/04/2015   Follow-up plan: ICM clinic phone appointment 12/08/2015.    FLUID LEVELS: Corvue thoracic impedance decreased 10/12/2015 to 10/17/2015 and 10/25/2015 to 10/27/2015 suggesting fluid accumulation.  Thoracic impedance returned to baseline 10/27/2015 and above baseline suggesting dryness.      SYMPTOMS:  None, denied any fluid symptoms such as weight gain of 3 pounds overnight or 5 pounds within a week, SOB and/or lower extremity swelling. Encouraged to call for any fluid symptoms.  She denied any dehydration symptoms and stated she has not been drinking enough work.  EDUCATION: Limit sodium intake to < 2000 mg and fluid intake to 64 oz daily.     RECOMMENDATIONS: No changes today.     [name], RN, CCM 11/04/2015 2:54 PM

## 2015-11-15 ENCOUNTER — Telehealth: Payer: Self-pay | Admitting: Cardiology

## 2015-11-15 NOTE — Telephone Encounter (Signed)
Spoke w/ pt and requested that she send a manual transmission b/c her home monitor has not updated in at least 8 days.   

## 2015-11-26 ENCOUNTER — Telehealth: Payer: Self-pay | Admitting: Cardiology

## 2015-11-26 NOTE — Telephone Encounter (Signed)
Attempted to call pt and request pt send a manual transmission b/c her home monitor has not updated in at least 8 days. No answer and unable to leave a message.

## 2015-12-08 ENCOUNTER — Telehealth: Payer: Self-pay | Admitting: Cardiology

## 2015-12-08 ENCOUNTER — Ambulatory Visit (INDEPENDENT_AMBULATORY_CARE_PROVIDER_SITE_OTHER): Payer: BLUE CROSS/BLUE SHIELD | Admitting: *Deleted

## 2015-12-08 DIAGNOSIS — Z95810 Presence of automatic (implantable) cardiac defibrillator: Secondary | ICD-10-CM | POA: Diagnosis not present

## 2015-12-08 DIAGNOSIS — I255 Ischemic cardiomyopathy: Secondary | ICD-10-CM

## 2015-12-08 NOTE — Telephone Encounter (Signed)
Spoke with pt and reminded pt of remote transmission that is due today. Pt verbalized understanding.   

## 2015-12-10 ENCOUNTER — Encounter: Payer: Self-pay | Admitting: Cardiology

## 2015-12-10 ENCOUNTER — Telehealth: Payer: Self-pay | Admitting: Cardiology

## 2015-12-10 NOTE — Telephone Encounter (Signed)
Spoke w/ pt and requested that she send a manual transmission b/c her home monitor has not updated in at least 8 days.   

## 2015-12-13 ENCOUNTER — Ambulatory Visit (INDEPENDENT_AMBULATORY_CARE_PROVIDER_SITE_OTHER): Payer: BLUE CROSS/BLUE SHIELD

## 2015-12-13 ENCOUNTER — Encounter: Payer: Self-pay | Admitting: Cardiology

## 2015-12-13 DIAGNOSIS — Z95810 Presence of automatic (implantable) cardiac defibrillator: Secondary | ICD-10-CM

## 2015-12-13 DIAGNOSIS — I5022 Chronic systolic (congestive) heart failure: Secondary | ICD-10-CM | POA: Diagnosis not present

## 2015-12-13 NOTE — Progress Notes (Signed)
EPIC Encounter for ICM Monitoring  Patient Name: Emily Mays is a 55 y.o. female Date: 12/13/2015 Primary Care Physican: Bonnita Nasuti, MD Primary Cardiologist: [place] Electrophysiologist: Allred Dry Weight: 157 lb         Heart Failure questions reviewed, pt asymptomatic  Thoracic impedance decreased suggesting fluid accumulation 11/21/2015 to 12/04/2015 suggesting fluid accumulation.  Impedance returned to baseline 12/04/2015 and fluid stabilized.   Recommendations: No changes.  Low sodium diet education provided   ICM trend: 12/13/2015    Follow-up plan: ICM clinic phone appointment on 01/13/2016.  Copy of ICM check sent to device physician.   [name], RN 12/13/2015 10:04 AM

## 2015-12-13 NOTE — Progress Notes (Signed)
Remote ICD transmission.   

## 2015-12-15 LAB — CUP PACEART REMOTE DEVICE CHECK
Battery Remaining Longevity: 80 mo
Battery Remaining Percentage: 78 %
Brady Statistic RV Percent Paced: 0 %
HIGH POWER IMPEDANCE MEASURED VALUE: 75 Ohm
Implantable Lead Implant Date: 20101228
Implantable Lead Location: 753860
Lead Channel Impedance Value: 650 Ohm
Lead Channel Setting Sensing Sensitivity: 0.5 mV
MDC IDC LEAD MODEL: 7122
MDC IDC MSMT LEADCHNL RV SENSING INTR AMPL: 12 mV
MDC IDC PG SERIAL: 7152745
MDC IDC SESS DTM: 20170802165403
MDC IDC SET LEADCHNL RV PACING AMPLITUDE: 2.5 V
MDC IDC SET LEADCHNL RV PACING PULSEWIDTH: 0.5 ms

## 2015-12-27 ENCOUNTER — Other Ambulatory Visit: Payer: Self-pay | Admitting: Cardiovascular Disease

## 2016-01-20 NOTE — Progress Notes (Signed)
No ICM remote transmission received 01/13/2016.  Next ICM remote transmission scheduled for 01/27/2016.

## 2016-01-27 ENCOUNTER — Telehealth: Payer: Self-pay | Admitting: Cardiology

## 2016-01-27 ENCOUNTER — Ambulatory Visit (INDEPENDENT_AMBULATORY_CARE_PROVIDER_SITE_OTHER): Payer: BLUE CROSS/BLUE SHIELD

## 2016-01-27 DIAGNOSIS — I5022 Chronic systolic (congestive) heart failure: Secondary | ICD-10-CM | POA: Diagnosis not present

## 2016-01-27 DIAGNOSIS — Z95810 Presence of automatic (implantable) cardiac defibrillator: Secondary | ICD-10-CM

## 2016-01-27 NOTE — Telephone Encounter (Signed)
Spoke with pt and reminded pt of remote transmission that is due today. Pt verbalized understanding.   

## 2016-01-27 NOTE — Progress Notes (Addendum)
EPIC Encounter for ICM Monitoring  Patient Name: Emily Mays is a 55 y.o. female Date: 01/27/2016 Primary Care Physican: Bonnita Nasuti, MD Primary Cardiologist: Burt Knack Electrophysiologist: Allred Dry Weight: 165 lb        Heart Failure questions reviewed, pt symptomatic with stomach bloating and feeling congested in chest with cough  Thoracic impedance abnormal suggesting fluid accumulation since 01/26/2016.  Recommendations:   Increase Furosemide 20 mg to bid x 3 days and then return to prescribed dosage of 20 mg after 3rd day.    Follow-up plan: ICM clinic phone appointment on 02/03/2016.  Patient works nights and will send remote transmission early morning 02/03/2016.   Copy of ICM check sent to primary cardiologist and device physician.   ICM trend: 01/27/2016       [name], RN 01/27/2016 2:33 PM

## 2016-01-27 NOTE — Progress Notes (Signed)
thx Margarita Grizzle.   Lauren - might be best to just increase her lasix to 40 mg daily long-term to prevent further fluid excess. Let's check a BMET/BNP in a few weeks.

## 2016-01-28 NOTE — Progress Notes (Signed)
Attempted call to patient to advise of Dr Antionette Char recommendations to increase Furosemide to 40 mg daily and check a BMET/BNP in a few weeks but no answer.

## 2016-01-31 NOTE — Progress Notes (Signed)
Attempted call to patient to advise of Dr Antionette Char recommendations to increase Furosemide to 40 mg daily and check a BMET/BNP in a few weeks but no answer and no answering machine.

## 2016-02-01 ENCOUNTER — Telehealth: Payer: Self-pay

## 2016-02-01 DIAGNOSIS — I5022 Chronic systolic (congestive) heart failure: Secondary | ICD-10-CM

## 2016-02-01 MED ORDER — FUROSEMIDE 40 MG PO TABS: 40.0000 mg | ORAL_TABLET | Freq: Every day | ORAL | 6 refills | Status: DC

## 2016-02-01 NOTE — Progress Notes (Signed)
Attempted call to patient to advise of Dr Antionette Char recommendations to increase Furosemide to 40 mg daily and check a BMET/BNP in a few weeksbut no answer and no answering machine.

## 2016-02-01 NOTE — Telephone Encounter (Signed)
Emily Mocha, MD at 01/27/2016 11:10 AM   Status: Signed    thx Emily Mays.   Emily Mays - might be best to just increase her lasix to 40 mg daily long-term to prevent further fluid excess. Let's check a BMET/BNP in a few weeks.      I spoke with the pt and made her aware of medication change per Dr Burt Knack.  Rx sent to the pharmacy and the pt will have labs drawn on either 9/25 or 9/27 when she is near our office for another appointment.

## 2016-02-02 NOTE — Progress Notes (Signed)
  Barkley Boards, RN 14 hours ago (5:23 PM)    Sherren Mocha, MD at 01/27/2016 11:10 AM   Status: Signed    thx Margarita Grizzle.   Lauren - might be best to just increase her lasix to 40 mg daily long-term to prevent further fluid excess. Let's check a BMET/BNP in a few weeks.      I spoke with the pt and made her aware of medication change per Dr Burt Knack.  Rx sent to the pharmacy and the pt will have labs drawn on either 9/25 or 9/27 when she is near our office for another appointment.    Patient contacted by Theodosia Quay, RN.

## 2016-02-03 ENCOUNTER — Telehealth: Payer: Self-pay

## 2016-02-03 ENCOUNTER — Ambulatory Visit (INDEPENDENT_AMBULATORY_CARE_PROVIDER_SITE_OTHER): Payer: BLUE CROSS/BLUE SHIELD

## 2016-02-03 DIAGNOSIS — I5022 Chronic systolic (congestive) heart failure: Secondary | ICD-10-CM

## 2016-02-03 DIAGNOSIS — Z95810 Presence of automatic (implantable) cardiac defibrillator: Secondary | ICD-10-CM

## 2016-02-03 NOTE — Telephone Encounter (Signed)
Call to patient and requested to send remote ICM transmission to recheck fluid levels.  She will send this morning.  Will review this afternoon since patient works nights and will be sleeping this morning.

## 2016-02-04 NOTE — Progress Notes (Signed)
EPIC Encounter for ICM Monitoring  Patient Name: Emily Mays is a 55 y.o. female Date: 02/04/2016 Primary Care Physican: Bonnita Nasuti, MD Primary Cardiologist:Cooper Electrophysiologist: Allred Dry Weight: unknown      Heart Failure questions reviewed, pt asymptomatic   Thoracic impedance returned to normal 01/31/2016 but starting to trend below baseline again 02/03/2016.     Recommendations:  She confirmed she increased the Furosemide to 40 mg daily for long term as instructed by Dr Antionette Char nurse on 02/01/2016.   She is not checking food labels for salt amount and advised that she is probably getting more than 2000 mg daily which can cause fluid accumulation.    Follow-up plan: ICM clinic phone appointment on 02/21/2016 to recheck fluid levels after increase in Furosemide dosage to 40 mg as ordered.  Copy of ICM check sent to primary cardiologist and device physician.   ICM trend: 02/04/2016       [name], RN 02/04/2016 1:48 PM

## 2016-02-07 ENCOUNTER — Other Ambulatory Visit: Payer: BLUE CROSS/BLUE SHIELD

## 2016-02-09 ENCOUNTER — Other Ambulatory Visit: Payer: BLUE CROSS/BLUE SHIELD | Admitting: *Deleted

## 2016-02-09 DIAGNOSIS — I5022 Chronic systolic (congestive) heart failure: Secondary | ICD-10-CM

## 2016-02-09 LAB — BASIC METABOLIC PANEL
BUN: 17 mg/dL (ref 7–25)
CALCIUM: 9.9 mg/dL (ref 8.6–10.4)
CHLORIDE: 101 mmol/L (ref 98–110)
CO2: 24 mmol/L (ref 20–31)
CREATININE: 0.83 mg/dL (ref 0.50–1.05)
Glucose, Bld: 120 mg/dL — ABNORMAL HIGH (ref 65–99)
Potassium: 3.6 mmol/L (ref 3.5–5.3)
SODIUM: 139 mmol/L (ref 135–146)

## 2016-02-10 LAB — BRAIN NATRIURETIC PEPTIDE: Brain Natriuretic Peptide: 262.5 pg/mL — ABNORMAL HIGH (ref <100)

## 2016-02-21 ENCOUNTER — Telehealth: Payer: Self-pay

## 2016-02-21 ENCOUNTER — Telehealth: Payer: Self-pay | Admitting: Cardiology

## 2016-02-21 ENCOUNTER — Ambulatory Visit (INDEPENDENT_AMBULATORY_CARE_PROVIDER_SITE_OTHER): Payer: BLUE CROSS/BLUE SHIELD

## 2016-02-21 DIAGNOSIS — Z95810 Presence of automatic (implantable) cardiac defibrillator: Secondary | ICD-10-CM

## 2016-02-21 DIAGNOSIS — I5022 Chronic systolic (congestive) heart failure: Secondary | ICD-10-CM

## 2016-02-21 NOTE — Telephone Encounter (Signed)
Attempted to confirm remote transmission with pt. No answer and was unable to leave a message.   

## 2016-02-21 NOTE — Telephone Encounter (Signed)
Call to patient and requested to send ICM remote transmission 

## 2016-02-21 NOTE — Progress Notes (Signed)
EPIC Encounter for ICM Monitoring  Patient Name: Emily Mays is a 55 y.o. female Date: 02/21/2016 Primary Care Physican: Bonnita Nasuti, MD Primary Cardiologist:Cooper Electrophysiologist: Allred Dry Weight: 162 lb        Heart Failure questions reviewed pt asymptomatic.  She stated she is feeling fine at this time  Thoracic impedance normal.  Confirmed she is taking Furosemide 40 mg daily as directed by Dr Burt Knack.   Recommendations:  Reviewed with Dr Burt Knack in the office and no changes recommended at this time.  Follow-up plan: ICM clinic phone appointment on 03/23/2016.  Copy of ICM check sent to primary cardiologist and device physician.   ICM trend: 02/21/2016       [name], RN 02/21/2016 3:33 PM

## 2016-03-23 ENCOUNTER — Ambulatory Visit (INDEPENDENT_AMBULATORY_CARE_PROVIDER_SITE_OTHER): Payer: BLUE CROSS/BLUE SHIELD | Admitting: *Deleted

## 2016-03-23 ENCOUNTER — Telehealth: Payer: Self-pay | Admitting: Cardiology

## 2016-03-23 DIAGNOSIS — I5022 Chronic systolic (congestive) heart failure: Secondary | ICD-10-CM

## 2016-03-23 DIAGNOSIS — Z95810 Presence of automatic (implantable) cardiac defibrillator: Secondary | ICD-10-CM | POA: Diagnosis not present

## 2016-03-23 DIAGNOSIS — I255 Ischemic cardiomyopathy: Secondary | ICD-10-CM

## 2016-03-23 NOTE — Telephone Encounter (Signed)
Spoke with pt and reminded pt of remote transmission that is due today. Pt verbalized understanding.   

## 2016-03-24 ENCOUNTER — Encounter: Payer: Self-pay | Admitting: Cardiology

## 2016-03-24 NOTE — Progress Notes (Signed)
EPIC Encounter for ICM Monitoring  Patient Name: Emily Mays is a 55 y.o. female Date: 03/24/2016 Primary Care Physican: Bonnita Nasuti, MD Primary Cardiologist:Cooper Electrophysiologist: Allred Dry Weight:    171 lb       Heart Failure questions reviewed, pt asymptomatic   Thoracic impedance normal since 03/09/2016   Recommendations: No changes.  Advised to limit salt intake to 2000 mg daily.  Encouraged to call for fluid symptoms.    Follow-up plan: ICM clinic phone appointment on 04/24/2016.  Copy of ICM check sent to device physician.   ICM trend: 03/24/2016       [name], RN 03/24/2016 2:38 PM

## 2016-03-24 NOTE — Progress Notes (Signed)
Remote ICD transmission.   

## 2016-03-31 ENCOUNTER — Telehealth: Payer: Self-pay | Admitting: Cardiology

## 2016-03-31 NOTE — Telephone Encounter (Signed)
Spoke w/ pt and requested that she send a manual transmission b/c her home monitor has not updated in at least 7 days.   

## 2016-04-13 ENCOUNTER — Telehealth: Payer: Self-pay | Admitting: Cardiology

## 2016-04-13 NOTE — Telephone Encounter (Signed)
Spoke w/ pt and requested that she send a manual transmission b/c her home monitor has not updated in at least 7 days.   

## 2016-04-20 LAB — CUP PACEART REMOTE DEVICE CHECK
Battery Remaining Longevity: 82 mo
Battery Voltage: 2.99 V
HighPow Impedance: 70 Ohm
HighPow Impedance: 70 Ohm
Implantable Lead Location: 753860
Lead Channel Sensing Intrinsic Amplitude: 11.7 mV
Lead Channel Setting Pacing Amplitude: 2.5 V
Lead Channel Setting Pacing Pulse Width: 0.5 ms
Lead Channel Setting Sensing Sensitivity: 0.5 mV
MDC IDC LEAD IMPLANT DT: 20101228
MDC IDC LEAD MODEL: 7122
MDC IDC MSMT BATTERY REMAINING PERCENTAGE: 76 %
MDC IDC MSMT LEADCHNL RV IMPEDANCE VALUE: 650 Ohm
MDC IDC MSMT LEADCHNL RV PACING THRESHOLD AMPLITUDE: 1 V
MDC IDC MSMT LEADCHNL RV PACING THRESHOLD PULSEWIDTH: 0.5 ms
MDC IDC PG IMPLANT DT: 20150102
MDC IDC SESS DTM: 20171109232622
MDC IDC STAT BRADY RV PERCENT PACED: 1 %
Pulse Gen Serial Number: 7152745

## 2016-04-24 ENCOUNTER — Telehealth: Payer: Self-pay | Admitting: Cardiology

## 2016-04-24 NOTE — Telephone Encounter (Signed)
LMOVM reminding pt to send remote transmission.   

## 2016-04-28 ENCOUNTER — Telehealth: Payer: Self-pay | Admitting: Cardiology

## 2016-04-28 NOTE — Telephone Encounter (Signed)
Attempted to call pt and request a manual transmission b/c her home monitor has not updated in at least 7 days. No answer and unable to leave a message.

## 2016-04-28 NOTE — Progress Notes (Signed)
No ICM remote transmission received on 04/24/2016.   Next ICM transmission scheduled for 05/23/2016.

## 2016-05-05 ENCOUNTER — Telehealth: Payer: Self-pay | Admitting: Cardiology

## 2016-05-05 NOTE — Telephone Encounter (Signed)
Spoke w/ pt and requested that she send a manual transmission b/c her home monitor has not updated in at least 7 days.   

## 2016-05-23 ENCOUNTER — Telehealth: Payer: Self-pay | Admitting: Cardiology

## 2016-05-23 NOTE — Telephone Encounter (Signed)
Spoke with pt and reminded pt of remote transmission that is due today. Pt verbalized understanding.   

## 2016-06-02 NOTE — Progress Notes (Signed)
No ICM remote transmission received on 05/23/2016.   Next ICM transmission scheduled for 06/22/2016.

## 2016-06-22 ENCOUNTER — Ambulatory Visit (INDEPENDENT_AMBULATORY_CARE_PROVIDER_SITE_OTHER): Payer: BLUE CROSS/BLUE SHIELD | Admitting: *Deleted

## 2016-06-22 ENCOUNTER — Telehealth: Payer: Self-pay | Admitting: Cardiology

## 2016-06-22 DIAGNOSIS — I255 Ischemic cardiomyopathy: Secondary | ICD-10-CM

## 2016-06-22 DIAGNOSIS — I5022 Chronic systolic (congestive) heart failure: Secondary | ICD-10-CM | POA: Diagnosis not present

## 2016-06-22 DIAGNOSIS — Z95810 Presence of automatic (implantable) cardiac defibrillator: Secondary | ICD-10-CM

## 2016-06-22 NOTE — Telephone Encounter (Signed)
Attempted to confirm remote transmission with pt. No answer and was unable to leave a message.   

## 2016-06-23 ENCOUNTER — Encounter: Payer: Self-pay | Admitting: Cardiology

## 2016-06-23 NOTE — Progress Notes (Signed)
Remote ICD transmission.   

## 2016-06-23 NOTE — Progress Notes (Signed)
EPIC Encounter for ICM Monitoring  Patient Name: Emily Mays is a 56 y.o. female Date: 06/23/2016 Primary Care Physican: Bonnita Nasuti, MD Primary Cardiologist:Cooper Electrophysiologist: Allred Dry Weight:183 lb            Heart Failure questions reviewed, pt symptomatic with weight gain.  She thinks she has gained up to 10 pounds but she does not weigh at home and uses scales when she is out somewhere else.  Advised to weigh daily at home after she gets out of bed and empties her bladder with same amount of clothes on because that will provide a more accurate reading.    Thoracic impedance abnormal suggesting fluid accumulation.  Recommendations: Advised to Increase Furosemide 40 mg to 1 tablet bid x 3 days and after 3rd day return to 1 tablet daily.  Follow-up plan: ICM clinic phone appointment on 06/29/2016.  Copy of ICM check sent to primary cardiologist and device physician.   3 month ICM trend: 06/22/2016   1 Year ICM trend:      [name], RN 06/23/2016 4:51 PM

## 2016-06-27 LAB — CUP PACEART REMOTE DEVICE CHECK
Date Time Interrogation Session: 20180209121854
HIGH POWER IMPEDANCE MEASURED VALUE: 61 Ohm
HighPow Impedance: 61 Ohm
Implantable Lead Location: 753860
Implantable Pulse Generator Implant Date: 20150102
Lead Channel Impedance Value: 700 Ohm
Lead Channel Pacing Threshold Pulse Width: 0.5 ms
Lead Channel Setting Pacing Amplitude: 2.5 V
Lead Channel Setting Pacing Pulse Width: 0.5 ms
MDC IDC LEAD IMPLANT DT: 20101228
MDC IDC MSMT BATTERY REMAINING LONGEVITY: 79 mo
MDC IDC MSMT BATTERY REMAINING PERCENTAGE: 73 %
MDC IDC MSMT BATTERY VOLTAGE: 2.99 V
MDC IDC MSMT LEADCHNL RV PACING THRESHOLD AMPLITUDE: 1 V
MDC IDC MSMT LEADCHNL RV SENSING INTR AMPL: 12 mV
MDC IDC SET LEADCHNL RV SENSING SENSITIVITY: 0.5 mV
MDC IDC STAT BRADY RV PERCENT PACED: 1 %
Pulse Gen Serial Number: 7152745

## 2016-06-29 ENCOUNTER — Ambulatory Visit (INDEPENDENT_AMBULATORY_CARE_PROVIDER_SITE_OTHER): Payer: BLUE CROSS/BLUE SHIELD

## 2016-06-29 DIAGNOSIS — I5022 Chronic systolic (congestive) heart failure: Secondary | ICD-10-CM

## 2016-06-29 DIAGNOSIS — Z95810 Presence of automatic (implantable) cardiac defibrillator: Secondary | ICD-10-CM

## 2016-06-29 NOTE — Progress Notes (Signed)
EPIC Encounter for ICM Monitoring  Patient Name: Emily Mays is a 56 y.o. female Date: 06/29/2016 Primary Care Physican: Bonnita Nasuti, MD Primary Cardiologist:Cooper Electrophysiologist: Allred Dry Weight:176 lb       Heart Failure questions reviewed, pt asymptomatic.    Thoracic impedance returned to normal after 3 days of increased Furosemide.  Current prescribed dose of Furosemide 40 mg 1 tablet daily.   Recommendations: No changes. Reminded to limit dietary salt intake to 2000 mg/day and fluid intake to < 2 liters/day. Encouraged to call for fluid symptoms.  Follow-up plan: ICM clinic phone appointment on 07/24/2016. Office appointment with Dr Burt Knack 07/24/2016.   Copy of ICM check sent to device physician.   3 month ICM trend: 06/29/2016   1 Year ICM trend:      [name], RN 06/29/2016 9:16 AM

## 2016-07-07 ENCOUNTER — Telehealth: Payer: Self-pay | Admitting: Cardiology

## 2016-07-07 NOTE — Telephone Encounter (Signed)
Spoke w/ pt and requested that she send a manual transmission b/c her home monitor has not updated in at least 7 days.   

## 2016-07-24 ENCOUNTER — Ambulatory Visit (INDEPENDENT_AMBULATORY_CARE_PROVIDER_SITE_OTHER): Payer: BLUE CROSS/BLUE SHIELD

## 2016-07-24 ENCOUNTER — Telehealth: Payer: Self-pay

## 2016-07-24 ENCOUNTER — Encounter: Payer: Self-pay | Admitting: Cardiovascular Disease

## 2016-07-24 ENCOUNTER — Ambulatory Visit (INDEPENDENT_AMBULATORY_CARE_PROVIDER_SITE_OTHER): Payer: BLUE CROSS/BLUE SHIELD | Admitting: Cardiovascular Disease

## 2016-07-24 VITALS — BP 110/68 | HR 78 | Ht 65.0 in | Wt 181.8 lb

## 2016-07-24 DIAGNOSIS — I5022 Chronic systolic (congestive) heart failure: Secondary | ICD-10-CM

## 2016-07-24 DIAGNOSIS — I10 Essential (primary) hypertension: Secondary | ICD-10-CM

## 2016-07-24 DIAGNOSIS — Z95810 Presence of automatic (implantable) cardiac defibrillator: Secondary | ICD-10-CM | POA: Diagnosis not present

## 2016-07-24 MED ORDER — SPIRONOLACTONE 25 MG PO TABS: 25.0000 mg | ORAL_TABLET | Freq: Every day | ORAL | 6 refills | Status: DC

## 2016-07-24 MED ORDER — FUROSEMIDE 40 MG PO TABS: ORAL_TABLET | ORAL | 6 refills | Status: DC

## 2016-07-24 MED ORDER — NITROGLYCERIN 0.4 MG SL SUBL: 0.4000 mg | SUBLINGUAL_TABLET | SUBLINGUAL | 2 refills | Status: DC | PRN

## 2016-07-24 NOTE — Patient Instructions (Addendum)
Medication Instructions:  Your physician has recommended you make the following change in your medication:  1. INCREASE Spironolactone to 25mg  take one tablet by mouth daily 2. INCREASE Furosemide to 40mg  take one and one-half tablet by mouth daily  Labwork: Your physician recommends that you return for lab work in: 2 WEEKS (BMP)  Testing/Procedures: No new orders.   Follow-Up: Your physician recommends that you schedule a follow-up appointment in: 3 MONTHS with Richardson Dopp PA-C   Any Other Special Instructions Will Be Listed Below (If Applicable).     If you need a refill on your cardiac medications before your next appointment, please call your pharmacy.   Low-Sodium Eating Plan Sodium, which is an element that makes up salt, helps you maintain a healthy balance of fluids in your body. Too much sodium can increase your blood pressure and cause fluid and waste to be held in your body. Your health care provider or dietitian may recommend following this plan if you have high blood pressure (hypertension), kidney disease, liver disease, or heart failure. Eating less sodium can help lower your blood pressure, reduce swelling, and protect your heart, liver, and kidneys. What are tips for following this plan? General guidelines   Most people on this plan should limit their sodium intake to 1,500-2,000 mg (milligrams) of sodium each day. Reading food labels   The Nutrition Facts label lists the amount of sodium in one serving of the food. If you eat more than one serving, you must multiply the listed amount of sodium by the number of servings.  Choose foods with less than 140 mg of sodium per serving.  Avoid foods with 300 mg of sodium or more per serving. Shopping   Look for lower-sodium products, often labeled as "low-sodium" or "no salt added."  Always check the sodium content even if foods are labeled as "unsalted" or "no salt added".  Buy fresh foods.  Avoid canned foods  and premade or frozen meals.  Avoid canned, cured, or processed meats  Buy breads that have less than 80 mg of sodium per slice. Cooking   Eat more home-cooked food and less restaurant, buffet, and fast food.  Avoid adding salt when cooking. Use salt-free seasonings or herbs instead of table salt or sea salt. Check with your health care provider or pharmacist before using salt substitutes.  Cook with plant-based oils, such as canola, sunflower, or olive oil. Meal planning   When eating at a restaurant, ask that your food be prepared with less salt or no salt, if possible.  Avoid foods that contain MSG (monosodium glutamate). MSG is sometimes added to [place] food, bouillon, and some canned foods. What foods are recommended? The items listed may not be a complete list. Talk with your dietitian about what dietary choices are best for you. Grains  Low-sodium cereals, including oats, puffed wheat and rice, and shredded wheat. Low-sodium crackers. Unsalted rice. Unsalted pasta. Low-sodium bread. Whole-grain breads and whole-grain pasta. Vegetables  Fresh or frozen vegetables. "No salt added" canned vegetables. "No salt added" tomato sauce and paste. Low-sodium or reduced-sodium tomato and vegetable juice. Fruits  Fresh, frozen, or canned fruit. Fruit juice. Meats and other protein foods  Fresh or frozen (no salt added) meat, poultry, seafood, and fish. Low-sodium canned tuna and salmon. Unsalted nuts. Dried peas, beans, and lentils without added salt. Unsalted canned beans. Eggs. Unsalted nut butters. Dairy  Milk. Soy milk. Cheese that is naturally low in sodium, such as ricotta cheese, fresh mozzarella, or  Swiss cheese Low-sodium or reduced-sodium cheese. Cream cheese. Yogurt. Fats and oils  Unsalted butter. Unsalted margarine with no trans fat. Vegetable oils such as canola or olive oils. Seasonings and other foods  Fresh and dried herbs and spices. Salt-free seasonings. Low-sodium  mustard and ketchup. Sodium-free salad dressing. Sodium-free light mayonnaise. Fresh or refrigerated horseradish. Lemon juice. Vinegar. Homemade, reduced-sodium, or low-sodium soups. Unsalted popcorn and pretzels. Low-salt or salt-free chips. What foods are not recommended? The items listed may not be a complete list. Talk with your dietitian about what dietary choices are best for you. Grains  Instant hot cereals. Bread stuffing, pancake, and biscuit mixes. Croutons. Seasoned rice or pasta mixes. Noodle soup cups. Boxed or frozen macaroni and cheese. Regular salted crackers. Self-rising flour. Vegetables  Sauerkraut, pickled vegetables, and relishes. Olives. [place] fries. Onion rings. Regular canned vegetables (not low-sodium or reduced-sodium). Regular canned tomato sauce and paste (not low-sodium or reduced-sodium). Regular tomato and vegetable juice (not low-sodium or reduced-sodium). Frozen vegetables in sauces. Meats and other protein foods  Meat or fish that is salted, canned, smoked, spiced, or pickled. Bacon, ham, sausage, hotdogs, corned beef, chipped beef, packaged lunch meats, salt pork, jerky, pickled herring, anchovies, regular canned tuna, sardines, salted nuts. Dairy  Processed cheese and cheese spreads. Cheese curds. Blue cheese. Feta cheese. String cheese. Regular cottage cheese. Buttermilk. Canned milk. Fats and oils  Salted butter. Regular margarine. Ghee. Bacon fat. Seasonings and other foods  Onion salt, garlic salt, seasoned salt, table salt, and sea salt. Canned and packaged gravies. Worcestershire sauce. Tartar sauce. Barbecue sauce. Teriyaki sauce. Soy sauce, including reduced-sodium. Steak sauce. Fish sauce. Oyster sauce. Cocktail sauce. Horseradish that you find on the shelf. Regular ketchup and mustard. Meat flavorings and tenderizers. Bouillon cubes. Hot sauce and Tabasco sauce. Premade or packaged marinades. Premade or packaged taco seasonings. Relishes. Regular salad  dressings. Salsa. Potato and tortilla chips. Corn chips and puffs. Salted popcorn and pretzels. Canned or dried soups. Pizza. Frozen entrees and pot pies. Summary  Eating less sodium can help lower your blood pressure, reduce swelling, and protect your heart, liver, and kidneys.  Most people on this plan should limit their sodium intake to 1,500-2,000 mg (milligrams) of sodium each day.  Canned, boxed, and frozen foods are high in sodium. Restaurant foods, fast foods, and pizza are also very high in sodium. You also get sodium by adding salt to food.  Try to cook at home, eat more fresh fruits and vegetables, and eat less fast food, canned, processed, or prepared foods. This information is not intended to replace advice given to you by your health care provider. Make sure you discuss any questions you have with your health care provider. Document Released: 10/21/2001 Document Revised: 04/24/2016 Document Reviewed: 04/24/2016 Elsevier Interactive Patient Education  2017 [employer].

## 2016-07-24 NOTE — Progress Notes (Addendum)
EPIC Encounter for ICM Monitoring  Patient Name: Emily Mays is a 56 y.o. female Date: 07/24/2016 Primary Care Physican: Bonnita Nasuti, MD Primary Cardiologist:Cooper Electrophysiologist: Allred Dry Weight:unknown                   Transmission reviewed.  Patient has office appointment with Dr Burt Knack today.    Thoracic impedance abnormal suggesting fluid accumulation x 14 day since 07/02/2016 but trending back toward baseline today.  Prescribed dosage: Furosemide 40 mg 1 tablet daily  Recommendations:  Patient has office appointment with Dr Burt Knack today and any recommendations will be given at that time.   Follow-up plan: ICM clinic phone appointment on 08/29/2016.  Copy of ICM check sent to Dr Burt Knack and Dr Rayann Heman.   3 month ICM trend: 07/24/2016   1 Year ICM trend:      [name], RN 07/24/2016 8:01 AM

## 2016-07-24 NOTE — Progress Notes (Signed)
Cardiology Office Note Date:  07/24/2016   ID:  Emily Mays, DOB [birth year], MRN [id number]  PCP:  Bonnita Nasuti, MD  Cardiologist:  Sherren Mocha, MD    Chief Complaint  Patient presents with  . chronic systolic heart failure     History of Present Illness: Emily Mays is a 56 y.o. female who presents for follow-up of CAD and chronic systolic heart failure. 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 This required extensive stenting of the LAD and left circumflex. She went on to develop a severe ischemic cardiomyopathy and was treated with an ICD for primary prevention of sudden cardiac death.  The patient is here alone today she continues to have a lot of stress at work. She is working night shift. Has not been following a low-sodium diet. Device check since shown that her thoracic impedance is indicative of fluid overload. She has had some abdominal swelling. Denies leg swelling, orthopnea, or PND. Complains of generalized fatigue. No shortness of breath or chest pain. She is compliant with her medications.  Past Medical History:  Diagnosis Date  . Anemia   . 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 s/p MI 07/24/08 with PCI Endeavor DES to Prox. LAD and BMA to prox. LCX.. relook cath 07/28/08 showing cont. patency of LAD to LCX.  Marland Kitchen Chronic systolic HF (heart failure) ([place])   . DDD (degenerative disc disease)   . Esophageal reflux   . GERD (gastroesophageal reflux disease)   . Hypokalemia   . Other specified forms of chronic ischemic heart disease    echo 07/26/2008 EfF 30% with perapical HK  . Paralytic ileus ([place])   . Unspecified essential hypertension     Past Surgical History:  Procedure Laterality Date  . ABDOMINAL HYSTERECTOMY    . BILATERAL SALPINGOOPHORECTOMY    . CARDIAC CATHETERIZATION  March 2010   2 cardiac stent placement in March 2010.   Marland Kitchen CARDIAC DEFIBRILLATOR PLACEMENT  december  2010   St. Jude  . CHOLECYSTECTOMY     27 years ago  . IMPLANTABLE CARDIOVERTER DEFIBRILLATOR GENERATOR CHANGE N/A 05/16/2013   Generator change (SJM Fortify Assura VR) for prior device malfunction (capacitors unable to charge)  . left pelvic lymphadenectomy     by Dr. Marti Sleigh   . TOTAL ABDOMINAL HYSTERECTOMY W/ BILATERAL SALPINGOOPHORECTOMY      Current Outpatient Prescriptions  Medication Sig Dispense Refill  . aspirin EC 81 MG tablet Take 81 mg by mouth daily.    . carvedilol (COREG) 25 MG tablet TAKE 1 TABLET (25 MG TOTAL) BY MOUTH 2 (TWO) TIMES DAILY WITH A MEAL. 180 tablet 2  . clopidogrel (PLAVIX) 75 MG tablet Take 1 tablet (75 mg total) by mouth at bedtime. 90 tablet 3  . DIGOX 125 MCG tablet Take 1 tablet by mouth daily.    Marland Kitchen FARXIGA 10 MG TABS tablet Take 10 mg by mouth daily.    . furosemide (LASIX) 40 MG tablet Take one and one-half tablet by mouth daily 45 tablet 6  . glipiZIDE (GLUCOTROL XL) 10 MG 24 hr tablet Take 1 tablet by mouth as directed.    . nitroGLYCERIN (NITROSTAT) 0.4 MG SL tablet Place 1 tablet (0.4 mg total) under the tongue every 5 (five) minutes as needed for chest pain (up to 3 doses MAX). 25 tablet 2  . ONE TOUCH ULTRA TEST test strip     . oxyCODONE-acetaminophen (PERCOCET)  10-325 MG per tablet Take 1 tablet by mouth every 8 (eight) hours as needed for pain.     . pantoprazole (PROTONIX) 40 MG tablet Take 40 mg by mouth daily.     . sitaGLIPtan-metformin (JANUMET) 50-1000 MG per tablet Take 1 tablet by mouth 2 (two) times daily with a meal.     . spironolactone (ALDACTONE) 25 MG tablet Take 1 tablet (25 mg total) by mouth daily. 30 tablet 6  . valsartan (DIOVAN) 40 MG tablet Take 1 tablet (40 mg total) by mouth daily. 90 tablet 3   No current facility-administered medications for this visit.     Allergies:   Duloxetine hcl and Penicillins   Social History:  The patient  reports that she has quit smoking. She smoked 0.50 packs per day. She  does not have any smokeless tobacco history on file. She reports that she drinks alcohol.   Family History:  The patient's  family history includes Breast cancer in her paternal aunt; CAD in her mother; Cancer in her father; Kidney cancer in her paternal aunt; Other (age of onset: 1) in her father; Throat cancer in her paternal uncle.    ROS:  Please see the history of present illness.  All other systems are reviewed and negative.    PHYSICAL EXAM: VS:  BP 110/68   Pulse 78   Ht 5\' 5"  (1.651 m)   Wt 181 lb 12.8 oz (82.5 kg)   BMI 30.25 kg/m  , BMI Body mass index is 30.25 kg/m. GEN: Well nourished, well developed, in no acute distress  HEENT: normal  Neck: Jugular venous pressure is mildly elevated, no masses. No carotid bruits Cardiac: RRR without murmur or gallop                Respiratory:  clear to auscultation bilaterally, normal work of breathing GI: soft, nontender, nondistended, + BS MS: no deformity or atrophy  Ext: no pretibial edema, pedal pulses 2+= bilaterally Skin: warm and dry, no rash Neuro:  Strength and sensation are intact Psych: euthymic mood, full affect  EKG:  EKG is ordered today. The ekg ordered today shows normal sinus rhythm 79 bpm, low-voltage QRS, age-indeterminate anteroseptal infarct.  Recent Labs: 02/09/2016: Brain Natriuretic Peptide 262.5; BUN 17; Creat 0.83; Potassium 3.6; Sodium 139   Lipid Panel     Component Value Date/Time   CHOL 117 04/04/2010 0935   TRIG 151.0 (H) 04/04/2010 0935   HDL 31.70 (L) 04/04/2010 0935   CHOLHDL 4 04/04/2010 0935   VLDL 30.2 04/04/2010 0935   LDLCALC 55 04/04/2010 0935   LDLDIRECT 71.4 09/17/2009 1044      Wt Readings from Last 3 Encounters:  07/24/16 181 lb 12.8 oz (82.5 kg)  10/25/15 162 lb (73.5 kg)  09/06/15 161 lb 12.8 oz (73.4 kg)     Cardiac Studies Reviewed: 2D Echo 12-22-2014: Study Conclusions  - Left ventricle: The cavity size was moderately dilated. Systolic  function was moderately  reduced. The estimated ejection fraction  was in the range of 35% to 40%. Akinesis of the  mid-apicalanteroseptal and apical myocardium. Doppler parameters  are consistent with abnormal left ventricular relaxation (grade 1  diastolic dysfunction).  ASSESSMENT AND PLAN: 1.  Chronic systolic heart failure, NYHA functional class II: Medical regimen reviewed includes carvedilol, Aldactone, digoxin, valsartan. Will increase furosemide to 60 mg daily and increase aldactone to 25 mg daily.   We had a lengthy discussion today about the importance of sodium restriction. She is adding  salt to her food and not following the sodium content of other foods. She understands this is an important part of managing her volume status in the context of chronic heart failure. Will check a metabolic panel in about 2 weeks after increasing her diuretics as described above.  2. CAD, native vessel, with old MI: Extensive past stenting of the LAD and left circumflex. Continue long-term dual antiplatelet therapy in the absence of bleeding problems.  3. Severe ischemic cardiomyopathy, status post ICD: Last echo reviewed with LVEF 35-40%.   Current medicines are reviewed with the patient today.  The patient does not have concerns regarding medicines.  Labs/ tests ordered today include:   Orders Placed This Encounter  Procedures  . Basic metabolic panel  . EKG 12-Lead    Disposition:   FU 3 months with Richardson Dopp, PA-C  Signed, Sherren Mocha, MD  07/24/2016 10:44 AM    [facility] [place] [place], [place], [place]  [address] Phone: [phone]; Fax: [phone]

## 2016-07-24 NOTE — Telephone Encounter (Signed)
Call to patient and requested to send remote ICM transmission.

## 2016-07-30 ENCOUNTER — Other Ambulatory Visit: Payer: Self-pay | Admitting: Cardiovascular Disease

## 2016-07-30 DIAGNOSIS — I5022 Chronic systolic (congestive) heart failure: Secondary | ICD-10-CM

## 2016-07-31 NOTE — Telephone Encounter (Signed)
Medication Detail    Disp Refills Start End   furosemide (LASIX) 40 MG tablet 45 tablet 6 07/24/2016    Sig: Take one and one-half tablet by mouth daily   E-Prescribing Status: Receipt confirmed by pharmacy (07/24/2016 9:43 AM EDT)   Pharmacy   [place], [place]

## 2016-08-07 ENCOUNTER — Other Ambulatory Visit: Payer: BLUE CROSS/BLUE SHIELD

## 2016-08-09 ENCOUNTER — Other Ambulatory Visit: Payer: BLUE CROSS/BLUE SHIELD

## 2016-08-10 ENCOUNTER — Other Ambulatory Visit: Payer: BLUE CROSS/BLUE SHIELD | Admitting: *Deleted

## 2016-08-10 DIAGNOSIS — I5022 Chronic systolic (congestive) heart failure: Secondary | ICD-10-CM

## 2016-08-10 DIAGNOSIS — I10 Essential (primary) hypertension: Secondary | ICD-10-CM

## 2016-08-11 LAB — BASIC METABOLIC PANEL
BUN/Creatinine Ratio: 11 (ref 9–23)
BUN: 9 mg/dL (ref 6–24)
CALCIUM: 9.8 mg/dL (ref 8.7–10.2)
CO2: 25 mmol/L (ref 18–29)
Chloride: 101 mmol/L (ref 96–106)
Creatinine, Ser: 0.8 mg/dL (ref 0.57–1.00)
GFR, EST AFRICAN AMERICAN: 97 mL/min/{1.73_m2} (ref >59)
GFR, EST NON AFRICAN AMERICAN: 84 mL/min/{1.73_m2} (ref >59)
Glucose: 145 mg/dL — ABNORMAL HIGH (ref 65–99)
Potassium: 3.6 mmol/L (ref 3.5–5.2)
Sodium: 144 mmol/L (ref 134–144)

## 2016-08-18 ENCOUNTER — Telehealth: Payer: Self-pay | Admitting: Cardiology

## 2016-08-18 NOTE — Telephone Encounter (Signed)
Spoke w/ pt and requested that she send a manual transmission b/c her home monitor has not updated in at least 7 days.   

## 2016-08-29 ENCOUNTER — Telehealth: Payer: Self-pay | Admitting: Cardiology

## 2016-08-29 ENCOUNTER — Ambulatory Visit (INDEPENDENT_AMBULATORY_CARE_PROVIDER_SITE_OTHER): Payer: BLUE CROSS/BLUE SHIELD

## 2016-08-29 DIAGNOSIS — I5022 Chronic systolic (congestive) heart failure: Secondary | ICD-10-CM | POA: Diagnosis not present

## 2016-08-29 DIAGNOSIS — Z95810 Presence of automatic (implantable) cardiac defibrillator: Secondary | ICD-10-CM

## 2016-08-29 NOTE — Progress Notes (Signed)
EPIC Encounter for ICM Monitoring  Patient Name: Emily Mays is a 56 y.o. female Date: 08/29/2016 Primary Care Physican: Bonnita Nasuti, MD Primary Cardiologist:Cooper Electrophysiologist: Allred Dry Weight:unknown       Heart Failure questions reviewed, pt asymptomatic.    Thoracic impedance abnormal suggesting fluid accumulation for the last 3 days.  Prescribed dosage: Furosemide 40 mg 1.5 (60 mg total) tablet daily.  Recommendations:  Increase Furosemide to 60 mg in am and 40 mg in the evening x 2 days and then return to 60 mg daily.  She verbalized understanding.   Follow-up plan: ICM clinic phone appointment on 09/07/2016.  Office appointment scheduled on 10/24/2016 with Richardson Dopp, PA.  Copy of ICM check sent to primary cardiologist and device physician.   3 month ICM trend: 08/29/2016   1 Year ICM trend:      [name], RN 08/29/2016 4:29 PM

## 2016-08-29 NOTE — Telephone Encounter (Signed)
Spoke with pt and reminded pt of remote transmission that is due today. Pt verbalized understanding.   

## 2016-09-07 ENCOUNTER — Telehealth: Payer: Self-pay | Admitting: Cardiology

## 2016-09-07 ENCOUNTER — Ambulatory Visit (INDEPENDENT_AMBULATORY_CARE_PROVIDER_SITE_OTHER): Payer: Self-pay

## 2016-09-07 DIAGNOSIS — Z95810 Presence of automatic (implantable) cardiac defibrillator: Secondary | ICD-10-CM

## 2016-09-07 DIAGNOSIS — I5022 Chronic systolic (congestive) heart failure: Secondary | ICD-10-CM

## 2016-09-07 NOTE — Telephone Encounter (Signed)
Spoke with pt and reminded pt of remote transmission that is due today. Pt verbalized understanding.   

## 2016-09-08 ENCOUNTER — Telehealth: Payer: Self-pay

## 2016-09-08 NOTE — Progress Notes (Signed)
EPIC Encounter for ICM Monitoring  Patient Name: Emily Mays is a 56 y.o. female Date: 09/08/2016 Primary Care Physican: Bonnita Nasuti, MD Primary Cardiologist:Cooper Electrophysiologist: Allred Dry Weight:unknown       Attempted call to patient and unable to reach.   Transmission reviewed.    Thoracic impedance returned to normal after taking extra Furosemide x 2 days.  Prescribed dosage: Furosemide 40 mg 1.5 (60 mg total) tablet daily.  Recommendations: NONE - Unable to reach patient   Follow-up plan: ICM clinic phone appointment on 09/29/2016.  Office appointment scheduled on 10/24/2016 with Richardson Dopp, PA.  Copy of ICM check sent to device physician.   3 month ICM trend: 09/07/2016   1 Year ICM trend:      [name], RN 09/08/2016 8:41 AM

## 2016-09-08 NOTE — Telephone Encounter (Signed)
Remote ICM transmission received.  Attempted patient call and no answer, no voice mail set up.

## 2016-09-20 ENCOUNTER — Other Ambulatory Visit: Payer: Self-pay | Admitting: Cardiovascular Disease

## 2016-09-29 ENCOUNTER — Ambulatory Visit (INDEPENDENT_AMBULATORY_CARE_PROVIDER_SITE_OTHER): Payer: BLUE CROSS/BLUE SHIELD

## 2016-09-29 ENCOUNTER — Telehealth: Payer: Self-pay | Admitting: Cardiology

## 2016-09-29 DIAGNOSIS — Z95810 Presence of automatic (implantable) cardiac defibrillator: Secondary | ICD-10-CM | POA: Diagnosis not present

## 2016-09-29 DIAGNOSIS — I5022 Chronic systolic (congestive) heart failure: Secondary | ICD-10-CM

## 2016-09-29 NOTE — Telephone Encounter (Signed)
Confirmed remote transmission w/ pt roomamte.

## 2016-09-29 NOTE — Progress Notes (Signed)
EPIC Encounter for ICM Monitoring  Patient Name: Emily Mays is a 56 y.o. female Date: 09/29/2016 Primary Care Physican: Raelyn Number, MD Primary Cardiologist:Cooper Electrophysiologist: Allred Dry Weight:unknown      Heart Failure questions reviewed, pt asymptomatic.   Thoracic impedance normal.  Prescribed dosage: Furosemide 40 mg 1.5 (60 mg total)tablet daily.  Recommendations: No changes.  Discussed to limit salt intake to 2000 mg/day and fluid intake to < 2 liters/day.  Encouraged to call for fluid symptoms or use local ER for any urgent symptoms.  Follow-up plan: ICM clinic phone appointment on 09/29/2016.  Office appointment scheduled on 10/24/2016 with Richardson Dopp, PA.  Copy of ICM check sent to device physician.   3 month ICM trend: 09/29/2016   1 Year ICM trend:      [name], RN 09/29/2016 3:57 PM

## 2016-10-05 ENCOUNTER — Encounter: Payer: Self-pay | Admitting: Physician Assistant

## 2016-10-23 ENCOUNTER — Telehealth: Payer: Self-pay | Admitting: Cardiology

## 2016-10-23 ENCOUNTER — Ambulatory Visit (INDEPENDENT_AMBULATORY_CARE_PROVIDER_SITE_OTHER): Payer: Self-pay

## 2016-10-23 DIAGNOSIS — I5022 Chronic systolic (congestive) heart failure: Secondary | ICD-10-CM

## 2016-10-23 DIAGNOSIS — Z95810 Presence of automatic (implantable) cardiac defibrillator: Secondary | ICD-10-CM

## 2016-10-23 NOTE — Telephone Encounter (Signed)
Spoke with pt and reminded pt of remote transmission that is due today. Pt verbalized understanding.   

## 2016-10-24 ENCOUNTER — Ambulatory Visit (INDEPENDENT_AMBULATORY_CARE_PROVIDER_SITE_OTHER): Payer: BLUE CROSS/BLUE SHIELD | Admitting: Physician Assistant

## 2016-10-24 ENCOUNTER — Encounter (INDEPENDENT_AMBULATORY_CARE_PROVIDER_SITE_OTHER): Payer: Self-pay

## 2016-10-24 ENCOUNTER — Encounter: Payer: Self-pay | Admitting: Physician Assistant

## 2016-10-24 VITALS — BP 110/62 | HR 72 | Ht 64.0 in | Wt 179.4 lb

## 2016-10-24 DIAGNOSIS — I10 Essential (primary) hypertension: Secondary | ICD-10-CM

## 2016-10-24 DIAGNOSIS — Z95810 Presence of automatic (implantable) cardiac defibrillator: Secondary | ICD-10-CM

## 2016-10-24 DIAGNOSIS — I5042 Chronic combined systolic (congestive) and diastolic (congestive) heart failure: Secondary | ICD-10-CM | POA: Insufficient documentation

## 2016-10-24 DIAGNOSIS — I2510 Atherosclerotic heart disease of native coronary artery without angina pectoris: Secondary | ICD-10-CM | POA: Diagnosis not present

## 2016-10-24 MED ORDER — SACUBITRIL-VALSARTAN 24-26 MG PO TABS: 1.0000 | ORAL_TABLET | Freq: Two times a day (BID) | ORAL | 3 refills | Status: DC

## 2016-10-24 MED ORDER — FUROSEMIDE 40 MG PO TABS: 40.0000 mg | ORAL_TABLET | Freq: Every day | ORAL | 3 refills | Status: DC

## 2016-10-24 NOTE — Progress Notes (Signed)
EPIC Encounter for ICM Monitoring  Patient Name: Emily Mays is a 56 y.o. female Date: 10/24/2016 Primary Care Physican: Raelyn Number, MD Primary Cardiologist:Cooper Electrophysiologist: Allred Dry Weight:unknown      Heart Failure questions reviewed, pt was symptomatic with weight gain when impedance was decreased.   Thoracic impedance normal but was abnormal suggesting fluid accumulation from 10/06/2016 to 10/13/2016.  Prescribed dosage: Furosemide 40 mg 1 tablet (40 mg total)daily.   Decreased from 1.5 tablets at office visit 10/24/2016 since she is starting Entresto.    Labs: 08/10/2016 Creatinine 0.80, BUN 9, Potassium 3.6, Sodium 144, EGFR 84-97  Recommendations:  Changes were made today at office appointment with Richardson Dopp, PA.  Advised to limit salt intake to 2000 mg/day and fluid intake to < 2 liters/day.  Encouraged to call for fluid symptoms.  Follow-up plan: ICM clinic phone appointment on 11/27/2016.  Advised patient to call the office to set up appointment because she was due to see Dr Rayann Heman in April 2018.  Copy of ICM check sent to device physician.   3 month ICM trend: 10/24/2016   1 Year ICM trend:      [name], RN 10/24/2016 10:30 AM

## 2016-10-24 NOTE — Progress Notes (Signed)
Cardiology Office Note:    Date:  10/24/2016   ID:  Emily Mays, DOB 04-18-1961, MRN [id number]  PCP:  Emily Number, MD  Cardiologist:  Dr. Sherren Mays   Electrophysiologist:  Dr. Thompson Mays      Referring MD: Emily Number, MD   Chief Complaint  Patient presents with  . Congestive Heart Failure    follow up    History of Present Illness:    Emily Mays is a 56 y.o. female with a hx of CAD, ischemic cardiomyopathy, chronic systolic CHF, status post ICD. She presented in 2010 with spontaneous dissection of the LAD and required extensive stenting of the LAD and LCx. EF by cardiac MRI in 2010 was 35%. Last echo in 8/16 demonstrated an EF of 35-40%. She was last seen by Dr. Burt Mays 3/18. She was volume overloaded and her diuretics were adjusted. She also admitted to a diet high in salt and she was counseled on this. She is followed in the [facility] clinic. Her last device check 09/29/16 demonstrated normal breast sick impedance.  Emily Mays returns for close follow up on congestive heart failure.  She is here alone.  She remains short of breath with more mod activities.  She thinks she should have doubled her diuretic.  She gets out of breath at the top of a flight of stairs.  She has not had leg edema or significant increase in abdominal girth.  She has not had chest pain, or syncope.  She denies cough, bleeding issues, vomiting.    Prior CV studies:   The following studies were reviewed today:  Echo 12/22/14 EF 35-40, anteroseptal and apical AK, grade 1 diastolic dysfunction  Myoview 3/11 Anterior, anterolateral, anteroseptal, inferior, inferolateral, apical scar, no ischemia, EF 40  cMRI 02/2009 Impression 1)    Moderate LVE with anterior, apical and septal akineis.  EF 35% 2)    Full thickness scar with thinning of the mid and distal anterior wall, septum and apex. 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 Anemia   . 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 s/p MI 07/24/08 with PCI Endeavor DES to Prox. LAD and BMA to prox. LCX.. relook cath 07/28/08 showing cont. patency of LAD to LCX.  Marland Kitchen Chronic systolic HF (heart failure) ([place])   . DDD (degenerative disc disease)   . Esophageal reflux   . GERD (gastroesophageal reflux disease)   . Hypokalemia   . Other specified forms of chronic ischemic heart disease    echo 07/26/2008 EfF 30% with perapical HK  . Paralytic ileus ([place])   . Unspecified essential hypertension     Past Surgical History:  Procedure Laterality Date  . ABDOMINAL HYSTERECTOMY    . BILATERAL SALPINGOOPHORECTOMY    . CARDIAC CATHETERIZATION  March 2010   2 cardiac stent placement in March 2010.   Marland Kitchen CARDIAC DEFIBRILLATOR PLACEMENT  december 2010   St. Jude  . CHOLECYSTECTOMY     27 years ago  . IMPLANTABLE CARDIOVERTER DEFIBRILLATOR GENERATOR CHANGE N/A 05/16/2013   Generator change (SJM Fortify Assura VR) for prior device malfunction (capacitors unable to charge)  . left pelvic lymphadenectomy     by Dr. Marti Mays   .  TOTAL ABDOMINAL HYSTERECTOMY W/ BILATERAL SALPINGOOPHORECTOMY      Current Medications: Current Meds  Medication Sig  . aspirin EC 81 MG tablet Take 81 mg by mouth daily.  . carvedilol (COREG) 25 MG tablet Take 1 tablet (25 mg total) by mouth 2 (two) times daily with a meal. Please keep 10/24/16 appt for further refills.  . carvedilol (COREG) 25 MG tablet Take 25 mg by mouth 2 (two) times daily.  . clopidogrel (PLAVIX) 75 MG tablet Take 1 tablet (75 mg total) by mouth at bedtime.  Marland Kitchen DIGOX 125 MCG tablet Take 1 tablet by mouth daily.  Marland Kitchen FARXIGA 10 MG TABS tablet Take 10 mg by mouth daily.  Marland Kitchen glipiZIDE (GLUCOTROL XL) 10 MG 24 hr tablet Take 1 tablet by mouth daily.     . nitroGLYCERIN (NITROSTAT) 0.4 MG SL tablet Place 1 tablet (0.4 mg total) under the tongue every 5 (five) minutes as needed for chest pain (up to 3 doses MAX).  . ONE TOUCH ULTRA TEST test strip   . oxyCODONE-acetaminophen (PERCOCET) 10-325 MG per tablet Take 1 tablet by mouth every 6 (six) hours as needed for pain.   . pantoprazole (PROTONIX) 40 MG tablet Take 40 mg by mouth daily.   . pioglitazone (ACTOS) 30 MG tablet Take 30 mg by mouth daily.  . sitaGLIPtan-metformin (JANUMET) 50-1000 MG per tablet Take 1 tablet by mouth 2 (two) times daily with a meal.   . spironolactone (ALDACTONE) 25 MG tablet Take 1 tablet (25 mg total) by mouth daily.  . [DISCONTINUED] furosemide (LASIX) 40 MG tablet Take one and one-half tablet by mouth daily  . [DISCONTINUED] valsartan (DIOVAN) 40 MG tablet Take 1 tablet (40 mg total) by mouth daily.     Allergies:   Duloxetine hcl and Penicillins   Social History   Social History  . Marital status: Single    Spouse name: N/A  . Mays of children: N/A  . Years of education: N/A   Occupational History  . 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 Smoking status: Former Smoker    Packs/day: 0.50  . Smokeless tobacco: Never Used     Comment: used to smoke 1/2 ppd but has not smoked since discharge.   . Alcohol use Yes     Comment: Occasional   . Drug use: No  . Sexual activity: No   Other Topics Concern  . 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 Lives in [place] with a roommate.      Family Hx: The patient's family history includes Breast cancer in her paternal aunt; CAD in her mother; Cancer in her father; Kidney cancer in her paternal aunt; Other (age of onset: 56) in her father; Throat cancer in her paternal uncle.  ROS:   Please see the history of present illness.    Review of Systems  Constitution: Positive for weight loss.  Cardiovascular: Positive for  dyspnea on exertion.  Musculoskeletal: Positive for back pain.  Gastrointestinal: Positive for nausea.   All other systems reviewed and are negative.   EKGs/Labs/Other Test Reviewed:    EKG:  EKG is  ordered today.  The ekg ordered today demonstrates NSR, HR 72, normal axis, anteroseptal Q waves, low voltage, QTc 383 ms, no change from prior tracing  Recent Labs: 02/09/2016: Brain Natriuretic Peptide 262.5 08/10/2016: BUN 9; Creatinine, Ser 0.80; Potassium 3.6; Sodium 144   Recent Lipid  Panel Lab Results  Component Value Date/Time   CHOL 117 04/04/2010 09:35 AM   TRIG 151.0 (H) 04/04/2010 09:35 AM   HDL 31.70 (L) 04/04/2010 09:35 AM   CHOLHDL 4 04/04/2010 09:35 AM   LDLCALC 55 04/04/2010 09:35 AM   LDLDIRECT 71.4 09/17/2009 10:44 AM    Physical Exam:    VS:  BP 110/62   Pulse 72   Ht 5\' 4"  (1.626 m)   Wt 179 lb 6.4 oz (81.4 kg)   BMI 30.79 kg/m     Wt Readings from Last 3 Encounters:  10/24/16 179 lb 6.4 oz (81.4 kg)  07/24/16 181 lb 12.8 oz (82.5 kg)  10/25/15 162 lb (73.5 kg)     Physical Exam  Constitutional: She is oriented to person, place, and time. She appears well-developed and well-nourished. No distress.  HENT:  Head: Normocephalic and atraumatic.  Eyes: No scleral icterus.  Neck: No JVD present.  Cardiovascular: Normal rate and regular rhythm.   No murmur heard. Pulmonary/Chest: Effort normal. She has no rales.  Abdominal: There is no hepatomegaly. There is no tenderness.  Musculoskeletal: She exhibits no edema.  Neurological: She is alert and oriented to person, place, and time.  Skin: Skin is warm and dry.  Psychiatric: She has a normal mood and affect.    ASSESSMENT:    1. Chronic combined systolic and diastolic CHF (congestive heart failure) ([place])   2. Coronary artery disease involving native coronary artery of native heart without angina pectoris   3. Essential hypertension   4. Implantable cardioverter-defibrillator (ICD) in situ    PLAN:     In order of problems listed above:  1. Chronic combined systolic and diastolic CHF (congestive heart failure) (HCC) -  2/2 ischemic cardiomyopathy.  She is NYHA 2-2b.  Her EF has been generally been < 40%.  She is trying to limit her salt more.  I recommend we try her on Entresto to further advance her congestive heart failure therapy.  -  Decrease Furosemide to 40 mg QD  -  DC Valsartan  -  Start Entresto 24/26 mg bid   -  Continue current dose of beta-blocker, Digoxin, aldosterone antagonist   -  BMET 1 week  -  She will call if her BP starts to run too low  -  FU with me ~ 6 weeks.  2. Coronary artery disease involving native coronary artery of native heart without angina pectoris -  S/p SCAD in 2010 tx with stenting of the LAD and LCx.  She denies angina. Continue ASA, Clopidogrel, beta-blocker.   3. Essential hypertension -  BP optimal.  I will reduce her loop diuretic to help with tolerating ARNI.    4. Implantable cardioverter-defibrillator (ICD) in situ - FU with EP as planned.   Dispo:  Return in about 6 weeks (around 12/05/2016) for w/ Richardson Dopp, PA-C.   Medication Adjustments/Labs and Tests Ordered: Current medicines are reviewed at length with the patient today.  Concerns regarding medicines are outlined above.  Orders/Tests:  Orders Placed This Encounter  Procedures  . Basic Metabolic Panel (BMET)  . EKG 12-Lead   Medication changes: Meds ordered this encounter  Medications  . sacubitril-valsartan (ENTRESTO) 24-26 MG    Sig: Take 1 tablet by mouth 2 (two) times daily.    Dispense:  180 tablet    Refill:  3  . furosemide (LASIX) 40 MG tablet    Sig: Take 1 tablet (40 mg total) by mouth daily.  Dispense:  90 tablet    Refill:  3   Signed, Richardson Dopp, PA-C  10/24/2016 11:11 AM    [address], [place], [place]  [address] Phone: [phone]; Fax: [phone]

## 2016-10-24 NOTE — Patient Instructions (Addendum)
Medication Instructions:  1. STOP DIOVAN  2. START ENTRESTO 24/26 MG 1 TABLET TWICE DAILY (SPACE OUT 12 HOURS APART); YOU WILL START 24 HOURS AFTER YOUR LAST DOSE OF DIOVAN  Labwork: BMET TO BE DONE IN 1 WEEK   Testing/Procedures: NONE ORDRED  Follow-Up: 12/05/16 @ 3:45 WITH SCOTT WEAVER, PAC   Any Other Special Instructions Will Be Listed Below (If Applicable).     If you need a refill on your cardiac medications before your next appointment, please call your pharmacy.

## 2016-10-24 NOTE — Progress Notes (Signed)
Agree. Richardson Dopp, PA-C    10/24/2016 5:12 PM

## 2016-10-25 ENCOUNTER — Telehealth: Payer: Self-pay

## 2016-10-25 NOTE — Telephone Encounter (Signed)
**Note De-Identified Dannie Woolen Obfuscation** PA for [name] has been done through covermymeds. Awaiting response.

## 2016-10-25 NOTE — Telephone Encounter (Signed)
**Note De-Identified Kallee Nam Obfuscation** PA on [name] has been approved by [employer] [employer]. Approval good from 09/24/16 until 10/24/2019. PA# [id number] SS

## 2016-11-02 ENCOUNTER — Other Ambulatory Visit: Payer: BLUE CROSS/BLUE SHIELD

## 2016-11-10 ENCOUNTER — Telehealth: Payer: Self-pay | Admitting: Cardiology

## 2016-11-10 NOTE — Telephone Encounter (Signed)
Spoke w/ pt and requested that she send a manual transmission b/c her home monitor has not updated in at least 7 days.   

## 2016-11-20 ENCOUNTER — Other Ambulatory Visit: Payer: Self-pay | Admitting: Physician Assistant

## 2016-11-20 ENCOUNTER — Telehealth: Payer: Self-pay | Admitting: Cardiology

## 2016-11-20 NOTE — Telephone Encounter (Signed)
Attempted to call pt b/c her home monitor has not updated in at least 7 days. No answer and unable to leave a message.  

## 2016-12-04 ENCOUNTER — Ambulatory Visit (INDEPENDENT_AMBULATORY_CARE_PROVIDER_SITE_OTHER): Payer: BLUE CROSS/BLUE SHIELD | Admitting: *Deleted

## 2016-12-04 ENCOUNTER — Telehealth: Payer: Self-pay

## 2016-12-04 DIAGNOSIS — I255 Ischemic cardiomyopathy: Secondary | ICD-10-CM | POA: Diagnosis not present

## 2016-12-04 NOTE — Telephone Encounter (Signed)
Attempted ICM call to request patient send remote transmission and voice mail has not been set up.

## 2016-12-05 ENCOUNTER — Encounter (INDEPENDENT_AMBULATORY_CARE_PROVIDER_SITE_OTHER): Payer: Self-pay

## 2016-12-05 ENCOUNTER — Encounter: Payer: Self-pay | Admitting: Physician Assistant

## 2016-12-05 ENCOUNTER — Ambulatory Visit (INDEPENDENT_AMBULATORY_CARE_PROVIDER_SITE_OTHER): Payer: BLUE CROSS/BLUE SHIELD | Admitting: Physician Assistant

## 2016-12-05 VITALS — BP 110/60 | HR 85 | Ht 64.0 in | Wt 176.8 lb

## 2016-12-05 DIAGNOSIS — I5042 Chronic combined systolic (congestive) and diastolic (congestive) heart failure: Secondary | ICD-10-CM

## 2016-12-05 DIAGNOSIS — I2510 Atherosclerotic heart disease of native coronary artery without angina pectoris: Secondary | ICD-10-CM | POA: Diagnosis not present

## 2016-12-05 DIAGNOSIS — I10 Essential (primary) hypertension: Secondary | ICD-10-CM

## 2016-12-05 DIAGNOSIS — Z95810 Presence of automatic (implantable) cardiac defibrillator: Secondary | ICD-10-CM | POA: Diagnosis not present

## 2016-12-05 NOTE — Progress Notes (Signed)
Cardiology Office Note:    Date:  12/05/2016   ID:  Emily Mays, DOB Feb 20, 1961, MRN [id number]  PCP:  Emily Number, MD  Cardiologist:  Dr. Sherren Mays   Electrophysiologist:  Dr. Thompson Mays      Referring MD: Emily Number, MD   Chief Complaint  Patient presents with  . Congestive Heart Failure    follow up    History of Present Illness:    Emily Mays is a 56 y.o. female with a hx of CAD, ischemic cardiomyopathy, chronic systolic CHF, status post ICD. She presented in 2010 with spontaneous dissection of the LAD and required extensive stenting of the LAD and LCx. EF by cardiac MRI in 2010 was 35%. Last echo in 8/16 demonstrated an EF of 35-40%.  She is followed in the [facility] clinic.  Last seen 6/18.  I changed her angiotensin receptor blocker to ARNI Emily Mays).    Emily Mays returns for follow-up.  She is here alone.  She denies chest pain, shortness of breath, syncope, orthopnea, PND or significant pedal edema.  She feels that her breathing has improved somewhat on Entresto.    Prior CV studies:   The following studies were reviewed today:  Echo 12/22/14 EF 35-40, anteroseptal and apical AK, grade 1 diastolic dysfunction   Myoview 3/11 Anterior, anterolateral, anteroseptal, inferior, inferolateral, apical scar, no ischemia, EF 40   cMRI 02/2009 Impression 1)    Moderate LVE with anterior, apical and septal akineis.  EF 35% 2)    Full thickness scar with thinning of the mid and distal anterior wall, septum and apex. 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 Anemia   . 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 s/p MI 07/24/08 with PCI Endeavor DES to Prox. LAD and BMA to prox. LCX.. relook cath 07/28/08 showing cont.  patency of LAD to LCX.  Marland Kitchen Chronic systolic HF (heart failure) ([place])   . DDD (degenerative disc disease)   . Esophageal reflux   . GERD (gastroesophageal reflux disease)   . Hypokalemia   . Other specified forms of chronic ischemic heart disease    echo 07/26/2008 EfF 30% with perapical HK  . Paralytic ileus ([place])   . Unspecified essential hypertension     Past Surgical History:  Procedure Laterality Date  . ABDOMINAL HYSTERECTOMY    . BILATERAL SALPINGOOPHORECTOMY    . CARDIAC CATHETERIZATION  March 2010   2 cardiac stent placement in March 2010.   Marland Kitchen CARDIAC DEFIBRILLATOR PLACEMENT  december 2010   St. Jude  . CHOLECYSTECTOMY     27 years ago  . IMPLANTABLE CARDIOVERTER DEFIBRILLATOR GENERATOR CHANGE N/A 05/16/2013   Generator change (SJM Fortify Assura VR) for prior device malfunction (capacitors unable to charge)  . left pelvic lymphadenectomy     by Dr. Marti Mays   . TOTAL ABDOMINAL HYSTERECTOMY W/ BILATERAL SALPINGOOPHORECTOMY      Current Medications: Current Meds  Medication Sig  . aspirin EC 81 MG tablet Take 81 mg by mouth daily.  . carvedilol (COREG) 25 MG tablet Take 25 mg by mouth 2 (two) times daily.  . clopidogrel (PLAVIX) 75 MG tablet Take 1 tablet (75 mg total) by mouth at  bedtime.  Marland Kitchen DIGOX 125 MCG tablet Take 1 tablet by mouth daily.  Marland Kitchen FARXIGA 10 MG TABS tablet Take 10 mg by mouth daily.  . furosemide (LASIX) 40 MG tablet Take 1 tablet (40 mg total) by mouth daily.  Marland Kitchen glipiZIDE (GLUCOTROL XL) 10 MG 24 hr tablet Take 1 tablet by mouth daily.   . nitroGLYCERIN (NITROSTAT) 0.4 MG SL tablet Place 1 tablet (0.4 mg total) under the tongue every 5 (five) minutes as needed for chest pain (up to 3 doses MAX).  . ONE TOUCH ULTRA TEST test strip   . oxyCODONE-acetaminophen (PERCOCET) 10-325 MG per tablet Take 1 tablet by mouth every 6 (six) hours as needed for pain.   . pantoprazole (PROTONIX) 40 MG tablet Take 40 mg by mouth daily.   . pioglitazone (ACTOS)  30 MG tablet Take 30 mg by mouth daily.  . sacubitril-valsartan (ENTRESTO) 24-26 MG Take 1 tablet by mouth 2 (two) times daily.  . sitaGLIPtan-metformin (JANUMET) 50-1000 MG per tablet Take 1 tablet by mouth 2 (two) times daily with a meal.   . spironolactone (ALDACTONE) 25 MG tablet Take 1 tablet (25 mg total) by mouth daily.     Allergies:   Duloxetine hcl and Penicillins   Social History   Social History  . Marital status: Single    Spouse name: N/A  . Mays of children: N/A  . Years of education: N/A   Occupational History  . 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 Smoking status: Former Smoker    Packs/day: 0.50  . Smokeless tobacco: Never Used     Comment: used to smoke 1/2 ppd but has not smoked since discharge.   . Alcohol use Yes     Comment: Occasional   . Drug use: No  . Sexual activity: No   Other Topics Concern  . 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 Lives in [place] with a roommate.      Family Hx: The patient's family history includes Breast cancer in her paternal aunt; CAD in her mother; Cancer in her father; Kidney cancer in her paternal aunt; Other (age of onset: 71) in her father; Throat cancer in her paternal uncle.  ROS:   Please see the history of present illness.    ROS All other systems reviewed and are negative.   EKGs/Labs/Other Test Reviewed:    EKG:  EKG is  ordered today.  The ekg ordered today demonstrateNSR, HR 85, normal axis, anteroseptal Q waves, QTc 416 ms, no change from prior tracing   Recent Labs: 02/09/2016: Brain Natriuretic Peptide 262.5 08/10/2016: BUN 9; Creatinine, Ser 0.80; Potassium 3.6; Sodium 144   Recent Lipid Panel Lab Results  Component Value Date/Time   CHOL 117 04/04/2010 09:35 AM   TRIG 151.0 (H) 04/04/2010 09:35 AM   HDL 31.70 (L) 04/04/2010 09:35 AM   CHOLHDL 4 04/04/2010 09:35 AM   LDLCALC 55 04/04/2010 09:35 AM    LDLDIRECT 71.4 09/17/2009 10:44 AM    Physical Exam:    VS:  BP 110/60   Pulse 85   Ht 5\' 4"  (1.626 m)   Wt 176 lb 12.8 oz (80.2 kg)   BMI 30.35 kg/m     Wt Readings from Last 3 Encounters:  12/05/16 176 lb 12.8 oz (80.2 kg)  10/24/16 179 lb 6.4 oz (81.4 kg)  07/24/16 181 lb 12.8 oz (82.5 kg)  Physical Exam  Constitutional: She is oriented to person, place, and time. She appears well-developed and well-nourished. No distress.  HENT:  Head: Normocephalic and atraumatic.  Eyes: No scleral icterus.  Neck: Normal range of motion. No JVD present.  Cardiovascular: Normal rate, regular rhythm, S1 normal, S2 normal and normal heart sounds.   No murmur heard. Pulmonary/Chest: Breath sounds normal. She has no wheezes. She has no rhonchi. She has no rales.  Abdominal: Soft. There is no tenderness.  Musculoskeletal: She exhibits no edema.  Neurological: She is alert and oriented to person, place, and time.  Skin: Skin is warm and dry.  Psychiatric: She has a normal mood and affect.    ASSESSMENT:    1. Chronic combined systolic and diastolic CHF (congestive heart failure) ([place])   2. Coronary artery disease involving native coronary artery of native heart without angina pectoris   3. Essential hypertension   4. Implantable cardioverter-defibrillator (ICD) in situ    PLAN:    In order of problems listed above:  1. Chronic combined systolic and diastolic CHF (congestive heart failure) ([place]) -  Secondary to ischemic cardiomyopathy. EF 35-40. Overall volume stable on current therapy which includes carvedilol, Entresto, spironolactone, digoxin. She is NYHA 2. Continue current regimen.   2. Coronary artery disease involving native coronary artery of native heart without angina pectoris -  Status post spontaneous coronary artery dissection 2010 treated with stenting of the LAD and LCx. She denies angina. Continue aspirin, clopidogrel, beta blocker.  3. Essential hypertension The  patient's blood pressure is controlled on her current regimen.  Continue current therapy.   4. Implantable cardioverter-defibrillator (ICD) in situ She is followed in the [facility] clinic. I have asked her to go ahead and make a transmission. Follow-up with EP as planned.  Dispo:  Return in about 6 months (around 06/07/2017) for Routine Follow Up, w/ Dr. Burt Knack.   Medication Adjustments/Labs and Tests Ordered: Current medicines are reviewed at length with the patient today.  Concerns regarding medicines are outlined above.  Tests Ordered: Orders Placed This Encounter  Procedures  . Basic metabolic panel  . EKG 12-Lead   Medication Changes: No orders of the defined types were placed in this encounter.   Signed, Richardson Dopp, PA-C  12/05/2016 4:15 PM    [facility] [address], [place], [place]  [address] Phone: [phone]; Fax: [phone]

## 2016-12-05 NOTE — Patient Instructions (Signed)
Medication Instructions:  No changes  Labwork: Today - BMET   Testing/Procedures: None   Follow-Up: Dr. Sherren Mocha in 6 months.   Any Other Special Instructions Will Be Listed Below (If Applicable). Go ahead and send a transmission in to [facility] from your device.  If you need a refill on your cardiac medications before your next appointment, please call your pharmacy.

## 2016-12-06 LAB — BASIC METABOLIC PANEL
BUN/Creatinine Ratio: 13 (ref 9–23)
BUN: 11 mg/dL (ref 6–24)
CALCIUM: 9.5 mg/dL (ref 8.7–10.2)
CO2: 23 mmol/L (ref 20–29)
Chloride: 100 mmol/L (ref 96–106)
Creatinine, Ser: 0.86 mg/dL (ref 0.57–1.00)
GFR, EST AFRICAN AMERICAN: 89 mL/min/{1.73_m2} (ref >59)
GFR, EST NON AFRICAN AMERICAN: 77 mL/min/{1.73_m2} (ref >59)
Glucose: 137 mg/dL — ABNORMAL HIGH (ref 65–99)
POTASSIUM: 3.9 mmol/L (ref 3.5–5.2)
Sodium: 139 mmol/L (ref 134–144)

## 2016-12-07 NOTE — Progress Notes (Signed)
No ICM remote transmission received for 12/04/2016 and next ICM transmission scheduled for 12/19/2016.

## 2016-12-13 NOTE — Progress Notes (Signed)
Remote ICD transmission.   

## 2016-12-14 ENCOUNTER — Encounter: Payer: Self-pay | Admitting: Cardiology

## 2016-12-19 ENCOUNTER — Ambulatory Visit (INDEPENDENT_AMBULATORY_CARE_PROVIDER_SITE_OTHER): Payer: BLUE CROSS/BLUE SHIELD

## 2016-12-19 ENCOUNTER — Telehealth: Payer: Self-pay

## 2016-12-19 DIAGNOSIS — I5042 Chronic combined systolic (congestive) and diastolic (congestive) heart failure: Secondary | ICD-10-CM

## 2016-12-19 DIAGNOSIS — Z95810 Presence of automatic (implantable) cardiac defibrillator: Secondary | ICD-10-CM | POA: Diagnosis not present

## 2016-12-19 NOTE — Progress Notes (Signed)
EPIC Encounter for ICM Monitoring  Patient Name: Emily Mays is a 56 y.o. female Date: 12/19/2016 Primary Care Physican: Raelyn Number, MD Primary Cardiologist:Cooper/Weaver PA Electrophysiologist: Allred Dry Weight:unknown          Heart Failure questions reviewed, pt asymptomatic.   Thoracic impedance abnormal suggesting fluid accumulation since 12/08/2016.  Patient has been eating a lot of saltine crackers for convenience. She has been working overtime and is difficult to follow low salt diet.   Prescribed dosage: Furosemide 40 mg 1 tablet (40 mg total)daily.     Labs: 12/05/2016 Creatinine 0.86, BUN 11, Potassium 3.9, Sodium 139, EGFR 77-89 08/10/2016 Creatinine 0.80, BUN 9,   Potassium 3.6, Sodium 144, EGFR 84-97  Recommendations: Advised to increased Furosemide 40 mg to 1 tablet twice a day x 3 days and then return to 1 tab daily. Advised to find replacement for saltine crackers that has less sodium such as fruit.  Follow-up plan: ICM clinic phone appointment on 12/22/2016.   Copy of ICM check sent to Richardson Dopp, [place], Dr Burt Knack and Dr Rayann Heman.   3 month ICM trend: 12/19/2016   1 Year ICM trend:      [name], RN 12/19/2016 11:50 AM

## 2016-12-19 NOTE — Telephone Encounter (Signed)
Spoke with pt and reminded pt of remote transmission that is due today. Pt verbalized understanding.   

## 2016-12-19 NOTE — Progress Notes (Signed)
Agree with plan. Thanks, Margarita Grizzle. Richardson Dopp, PA-C    12/19/2016 3:39 PM

## 2016-12-22 ENCOUNTER — Ambulatory Visit (INDEPENDENT_AMBULATORY_CARE_PROVIDER_SITE_OTHER): Payer: Self-pay

## 2016-12-22 DIAGNOSIS — Z95810 Presence of automatic (implantable) cardiac defibrillator: Secondary | ICD-10-CM

## 2016-12-22 DIAGNOSIS — I5042 Chronic combined systolic (congestive) and diastolic (congestive) heart failure: Secondary | ICD-10-CM

## 2016-12-22 MED ORDER — FUROSEMIDE 40 MG PO TABS: ORAL_TABLET | ORAL | 3 refills | Status: DC

## 2016-12-22 NOTE — Progress Notes (Signed)
Increase Lasix to 60 mg QD BMET 1 week. Recheck thoracic impedance as planned 01/01/17. Richardson Dopp, PA-C    12/22/2016 2:18 PM

## 2016-12-22 NOTE — Progress Notes (Signed)
Call to patient and advised Richardson Dopp, PA increased long term Furosemide 40 mg to 1.5 tablets (60 mg total) daily and have BMET drawn in a week.  She verbalized understanding.  She agreed to have BMET drawn next Friday.  Recheck ICM transmission 01/01/2017.

## 2016-12-22 NOTE — Progress Notes (Signed)
EPIC Encounter for ICM Monitoring  Patient Name: Emily Mays is a 56 y.o. female Date: 12/22/2016 Primary Care Physican: Raelyn Number, MD Primary Cardiologist:Cooper/Weaver PA Electrophysiologist: Allred Dry Weight:177.4 lbs       Heart Failure questions reviewed, pt asymptomatic.  She denied any fluid symptoms.     Thoracic impedance unchanged since last ICM transmission, 8/7.  It continues to be abnormal suggesting fluid accumulation after taking increased Furosemide 40 mg twice a day x 3 days.    Prescribed dosage: Furosemide 40 mg 1 tablet(40 mg total)daily.   Labs: 12/05/2016 Creatinine 0.86, BUN 11, Potassium 3.9, Sodium 139, EGFR 77-89 08/10/2016 Creatinine 0.80, BUN 9,   Potassium 3.6, Sodium 144, EGFR 84-97  Recommendations: No changes. She reported eliminating some of the high salty foods such as saltines since last ICM call.    Encouraged to call for fluid symptoms.  Follow-up plan: ICM clinic phone appointment on 01/01/2017 to recheck fluid levels.    Copy of ICM check sent to Dr Burt Knack, Richardson Dopp, PA and Dr Rayann Heman for review and any further recommendations since 3 days of increased Furosemide did not improve thoracic impedance.    3 month ICM trend: 12/22/2016   1 Year ICM trend:      [name], RN 12/22/2016 8:33 AM

## 2016-12-29 ENCOUNTER — Other Ambulatory Visit: Payer: BLUE CROSS/BLUE SHIELD | Admitting: *Deleted

## 2016-12-29 DIAGNOSIS — I5042 Chronic combined systolic (congestive) and diastolic (congestive) heart failure: Secondary | ICD-10-CM

## 2016-12-29 LAB — BASIC METABOLIC PANEL
BUN/Creatinine Ratio: 11 (ref 9–23)
BUN: 9 mg/dL (ref 6–24)
CALCIUM: 10 mg/dL (ref 8.7–10.2)
CO2: 26 mmol/L (ref 20–29)
CREATININE: 0.82 mg/dL (ref 0.57–1.00)
Chloride: 103 mmol/L (ref 96–106)
GFR calc Af Amer: 94 mL/min/{1.73_m2} (ref >59)
GFR calc non Af Amer: 82 mL/min/{1.73_m2} (ref >59)
GLUCOSE: 119 mg/dL — AB (ref 65–99)
POTASSIUM: 4.2 mmol/L (ref 3.5–5.2)
Sodium: 142 mmol/L (ref 134–144)

## 2017-01-01 ENCOUNTER — Telehealth: Payer: Self-pay | Admitting: *Deleted

## 2017-01-01 ENCOUNTER — Ambulatory Visit: Payer: BLUE CROSS/BLUE SHIELD

## 2017-01-01 NOTE — Telephone Encounter (Signed)
-----   Message from Liliane Shi, [place] sent at 12/29/2016  4:22 PM EDT ----- Please call patient. Kidney function is normal. Continue with current treatment plan. Richardson Dopp, PA-C   12/29/2016 4:22 PM

## 2017-01-01 NOTE — Telephone Encounter (Signed)
Patient informed. 

## 2017-01-02 LAB — CUP PACEART REMOTE DEVICE CHECK
Battery Remaining Longevity: 74 mo
Date Time Interrogation Session: 20180821084850
Implantable Lead Implant Date: 20101228
Implantable Lead Model: 7122
Lead Channel Setting Pacing Amplitude: 2.5 V
Lead Channel Setting Pacing Pulse Width: 0.5 ms
Lead Channel Setting Sensing Sensitivity: 0.5 mV
MDC IDC LEAD LOCATION: 753860
MDC IDC PG IMPLANT DT: 20150102
MDC IDC PG SERIAL: 7152745

## 2017-01-08 ENCOUNTER — Telehealth: Payer: Self-pay

## 2017-01-08 ENCOUNTER — Ambulatory Visit (INDEPENDENT_AMBULATORY_CARE_PROVIDER_SITE_OTHER): Payer: Self-pay

## 2017-01-08 DIAGNOSIS — Z95810 Presence of automatic (implantable) cardiac defibrillator: Secondary | ICD-10-CM

## 2017-01-08 DIAGNOSIS — I5042 Chronic combined systolic (congestive) and diastolic (congestive) heart failure: Secondary | ICD-10-CM

## 2017-01-08 NOTE — Telephone Encounter (Signed)
Remote ICM transmission received.  Attempted call to patient and voice mail has not been set up.

## 2017-01-08 NOTE — Progress Notes (Signed)
EPIC Encounter for ICM Monitoring  Patient Name: Emily Mays is a 56 y.o. female Date: 01/08/2017 Primary Care Physican: Raelyn Number, MD Primary Cardiologist:Cooper/Weaver PA Electrophysiologist: Allred Dry Weight:Previous ICM weight 177.4 lbs       Attempted call to patient and unable to reach.   Transmission reviewed.    Thoracic impedance returned to normal since last transmission on 12/22/2016  Prescribed dosage: Furosemide 40 mg to 1.5 tablets (60 mg total) daily   Recommendations: NONE - Unable to reach patient   Follow-up plan: ICM clinic phone appointment on 01/19/2017.    Copy of ICM check sent to Dr. Rayann Heman.   3 month ICM trend: 01/05/2017   1 Year ICM trend:      [name], RN 01/08/2017 11:57 AM

## 2017-01-09 ENCOUNTER — Other Ambulatory Visit: Payer: Self-pay | Admitting: Orthopedic Surgery

## 2017-01-09 DIAGNOSIS — M25511 Pain in right shoulder: Secondary | ICD-10-CM

## 2017-01-09 DIAGNOSIS — M75101 Unspecified rotator cuff tear or rupture of right shoulder, not specified as traumatic: Secondary | ICD-10-CM

## 2017-01-19 ENCOUNTER — Ambulatory Visit (INDEPENDENT_AMBULATORY_CARE_PROVIDER_SITE_OTHER): Payer: BLUE CROSS/BLUE SHIELD

## 2017-01-19 ENCOUNTER — Telehealth: Payer: Self-pay

## 2017-01-19 DIAGNOSIS — I5042 Chronic combined systolic (congestive) and diastolic (congestive) heart failure: Secondary | ICD-10-CM | POA: Diagnosis not present

## 2017-01-19 DIAGNOSIS — Z95810 Presence of automatic (implantable) cardiac defibrillator: Secondary | ICD-10-CM | POA: Diagnosis not present

## 2017-01-19 NOTE — Telephone Encounter (Signed)
Spoke with pt and reminded pt of remote transmission that is due today. Pt verbalized understanding.   

## 2017-01-19 NOTE — Progress Notes (Signed)
EPIC Encounter for ICM Monitoring  Patient Name: Emily Mays is a 56 y.o. female Date: 01/19/2017 Primary Care Physican: Raelyn Number, MD Primary Cardiologist:Cooper/Weaver PA Electrophysiologist: Allred Dry Weight:Previous ICM weight 177.4 lbs      Heart Failure questions reviewed, pt asymptomatic.   Thoracic impedance normal.  Prescribed dosage: Furosemide 40 mg to 1.5 tablets (60 mg total) daily  Recommendations: No changes.    Encouraged to call for fluid symptoms.  Follow-up plan: ICM clinic phone appointment on 02/19/2017.    Copy of ICM check sent to Dr. Rayann Heman.   3 month ICM trend: 01/19/2017   1 Year ICM trend:      [name], RN 01/19/2017 9:50 AM

## 2017-01-24 ENCOUNTER — Ambulatory Visit
Admission: RE | Admit: 2017-01-24 | Discharge: 2017-01-24 | Disposition: A | Discharge status: Home / Self Care | Payer: BLUE CROSS/BLUE SHIELD | Source: Ambulatory Visit | Attending: Orthopedic Surgery | Admitting: Orthopedic Surgery

## 2017-01-24 DIAGNOSIS — M25511 Pain in right shoulder: Secondary | ICD-10-CM

## 2017-01-24 DIAGNOSIS — M75101 Unspecified rotator cuff tear or rupture of right shoulder, not specified as traumatic: Secondary | ICD-10-CM

## 2017-01-24 MED ORDER — IOPAMIDOL (ISOVUE-M 200) INJECTION 41%
15.0000 mL | Freq: Once | INTRAMUSCULAR | Status: AC
  Administered 2017-01-24: 15 mL via INTRA_ARTICULAR

## 2017-02-09 ENCOUNTER — Telehealth: Payer: Self-pay | Admitting: Cardiology

## 2017-02-09 NOTE — Telephone Encounter (Signed)
Spoke w/ pt and requested that she send a manual transmission b/c her home monitor has not updated in at least 7 days.   

## 2017-02-19 ENCOUNTER — Telehealth: Payer: Self-pay

## 2017-02-19 ENCOUNTER — Ambulatory Visit (INDEPENDENT_AMBULATORY_CARE_PROVIDER_SITE_OTHER): Payer: BLUE CROSS/BLUE SHIELD

## 2017-02-19 DIAGNOSIS — I5042 Chronic combined systolic (congestive) and diastolic (congestive) heart failure: Secondary | ICD-10-CM

## 2017-02-19 DIAGNOSIS — Z95810 Presence of automatic (implantable) cardiac defibrillator: Secondary | ICD-10-CM

## 2017-02-19 NOTE — Telephone Encounter (Signed)
Remote ICM transmission received.  Attempted call to patient and no answer   

## 2017-02-19 NOTE — Progress Notes (Signed)
EPIC Encounter for ICM Monitoring  Patient Name: Emily Mays is a 56 y.o. female Date: 02/19/2017 Primary Care Physican: Raelyn Number, MD Primary Cardiologist:Cooper/Weaver PA Electrophysiologist: Allred Dry Weight:Previous ICM weight 177.4 lbs         Attempted call to patient and unable to reach.  Transmission reviewed.    Thoracic impedance normal.  Prescribed dosage: Furosemide 40 mg to 1.5 tablets (60 mg total) daily  Recommendations:  NONE - Unable to reach patient   Follow-up plan: ICM clinic phone appointment on 03/22/2017.  Copy of ICM check sent to Dr. Rayann Heman.   3 month ICM trend: 02/13/2017   1 Year ICM trend:      [name], RN 02/19/2017 3:14 PM

## 2017-03-05 ENCOUNTER — Telehealth: Payer: Self-pay | Admitting: Cardiology

## 2017-03-05 NOTE — Telephone Encounter (Signed)
Spoke w/ pt and requested that she send a manual transmission b/c her home monitor has not updated in at least 7 days.   

## 2017-03-21 ENCOUNTER — Other Ambulatory Visit: Payer: Self-pay | Admitting: *Deleted

## 2017-03-21 MED ORDER — SPIRONOLACTONE 25 MG PO TABS: 25.0000 mg | ORAL_TABLET | Freq: Every day | ORAL | 6 refills | Status: DC

## 2017-03-22 ENCOUNTER — Telehealth: Payer: Self-pay | Admitting: Cardiology

## 2017-03-22 ENCOUNTER — Encounter: Payer: BLUE CROSS/BLUE SHIELD | Admitting: *Deleted

## 2017-03-22 NOTE — Telephone Encounter (Signed)
Attempted to confirm remote transmission with pt. No answer and was unable to leave a message.   

## 2017-03-23 ENCOUNTER — Encounter: Payer: Self-pay | Admitting: Cardiology

## 2017-04-02 NOTE — Progress Notes (Signed)
No ICM remote transmission received for 03/22/2017 and next ICM transmission scheduled for 04/16/2017.

## 2017-04-03 ENCOUNTER — Telehealth: Payer: Self-pay | Admitting: Cardiology

## 2017-04-03 NOTE — Telephone Encounter (Signed)
Attempted to call pt b/c his home monitor has not updated in at least 7 days. No answer and unable to leave a message.  

## 2017-04-16 ENCOUNTER — Telehealth: Payer: Self-pay | Admitting: Cardiology

## 2017-04-16 NOTE — Telephone Encounter (Signed)
Attempted to confirm remote transmission with pt. No answer and was unable to leave a message.   

## 2017-04-20 NOTE — Progress Notes (Signed)
No ICM remote transmission received for 04/16/2017 and next ICM transmission scheduled for 05/29/2017.

## 2017-05-29 ENCOUNTER — Telehealth: Payer: Self-pay | Admitting: Cardiology

## 2017-05-29 NOTE — Telephone Encounter (Signed)
Spoke with pt and reminded pt of remote transmission that is due today. Pt verbalized understanding.   

## 2017-06-11 ENCOUNTER — Telehealth: Payer: Self-pay | Admitting: Cardiovascular Disease

## 2017-06-11 NOTE — Telephone Encounter (Signed)
New message    Patient calling the office for samples of medication:   1.  What medication and dosage are you requesting samples for?sacubitril-valsartan (ENTRESTO) 24-26 MG  2.  Are you currently out of this medication? Yes  Pt c/o medication issue:  1. Name of Medication: sacubitril-valsartan (ENTRESTO) 24-26 MG  2. How are you currently taking this medication (dosage and times per day)? As prescribed  3. Are you having a reaction (difficulty breathing--STAT)? no  4. What is your medication issue? Too costly

## 2017-06-11 NOTE — Telephone Encounter (Signed)
Pt states the medication  [name] is very expensive. Pt would like to have samples because she is almost out of them.  2 weeks supply of medications were placed at the front desk. Also pt is aware that an application for [facility] pt assistant application was placed with the medication for her to fill it out and return back to the office. Pt verbalized understanding.

## 2017-06-13 ENCOUNTER — Ambulatory Visit (INDEPENDENT_AMBULATORY_CARE_PROVIDER_SITE_OTHER): Payer: BLUE CROSS/BLUE SHIELD | Admitting: Cardiovascular Disease

## 2017-06-13 ENCOUNTER — Encounter: Payer: Self-pay | Admitting: Cardiovascular Disease

## 2017-06-13 VITALS — BP 116/56 | HR 85 | Ht 64.0 in | Wt 186.4 lb

## 2017-06-13 DIAGNOSIS — I5042 Chronic combined systolic (congestive) and diastolic (congestive) heart failure: Secondary | ICD-10-CM | POA: Diagnosis not present

## 2017-06-13 DIAGNOSIS — I2510 Atherosclerotic heart disease of native coronary artery without angina pectoris: Secondary | ICD-10-CM

## 2017-06-13 DIAGNOSIS — I10 Essential (primary) hypertension: Secondary | ICD-10-CM

## 2017-06-13 MED ORDER — LOSARTAN POTASSIUM 50 MG PO TABS: 50.0000 mg | ORAL_TABLET | Freq: Every day | ORAL | 3 refills | Status: DC

## 2017-06-13 NOTE — Patient Instructions (Signed)
Medication Instructions:  1) STOP ENTRESTO 2) START LOSARTAN 50 daily  Labwork: Please have a digoxin level drawn at your PCP tomorrow. Take your Digoxin after you have your labs drawn.  Testing/Procedures: None  Follow-Up: Your provider wants you to follow-up in: 6 months with Dr. Burt Knack. You will receive a reminder letter in the mail two months in advance. If you don't receive a letter, please call our office to schedule the follow-up appointment.    Any Other Special Instructions Will Be Listed Below (If Applicable).     If you need a refill on your cardiac medications before your next appointment, please call your pharmacy.

## 2017-06-13 NOTE — Progress Notes (Signed)
Cardiology Office Note Date:  06/13/2017   ID:  CHYLER CREELY, DOB [birth year], MRN [id number]  PCP:  Raelyn Number, MD  Cardiologist:  Sherren Mocha, MD    Chief Complaint  Patient presents with  . Shortness of Breath     History of Present Illness: Emily Mays is a 57 y.o. female who presents for follow-up of CHF.   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 This required extensive stenting of the LAD and left circumflex. She went on to develop a severe ischemic cardiomyopathy and was treated with an ICD for primary prevention of sudden cardiac death.  She's here with her daughter today. Doing ok, but chronic complaints of intermittent edema, fatigue, and exertional dyspnea are unchanged.  She denies chest pain, orthopnea, or PND.  She continues to do her job without any cardiopulmonary limitation.  She is very frustrated with the cost of Entresto and has not been able to take this medication because it is over $500 at her pharmacy.  Past Medical History:  Diagnosis Date  . Anemia   . 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 s/p MI 07/24/08 with PCI Endeavor DES to Prox. LAD and BMA to prox. LCX.. relook cath 07/28/08 showing cont. patency of LAD to LCX.  Marland Kitchen Chronic systolic HF (heart failure) ([place])   . DDD (degenerative disc disease)   . Esophageal reflux   . GERD (gastroesophageal reflux disease)   . Hypokalemia   . Other specified forms of chronic ischemic heart disease    echo 07/26/2008 EfF 30% with perapical HK  . Paralytic ileus ([place])   . Unspecified essential hypertension     Past Surgical History:  Procedure Laterality Date  . ABDOMINAL HYSTERECTOMY    . BILATERAL SALPINGOOPHORECTOMY    . CARDIAC CATHETERIZATION  March 2010   2 cardiac stent placement in March 2010.   Marland Kitchen CARDIAC DEFIBRILLATOR PLACEMENT  december 2010   [employer] Jude  . CHOLECYSTECTOMY     27 years ago  . IMPLANTABLE  CARDIOVERTER DEFIBRILLATOR GENERATOR CHANGE N/A 05/16/2013   Generator change (SJM Fortify Assura VR) for prior device malfunction (capacitors unable to charge)  . left pelvic lymphadenectomy     by Dr. Marti Sleigh   . TOTAL ABDOMINAL HYSTERECTOMY W/ BILATERAL SALPINGOOPHORECTOMY      Current Outpatient Medications  Medication Sig Dispense Refill  . aspirin EC 81 MG tablet Take 81 mg by mouth daily.    . Black Cohosh 40 MG CAPS Take 40 mg by mouth 2 (two) times daily.    . carvedilol (COREG) 25 MG tablet Take 25 mg by mouth 2 (two) times daily.    . clopidogrel (PLAVIX) 75 MG tablet Take 1 tablet (75 mg total) by mouth at bedtime. 90 tablet 3  . DIGOX 125 MCG tablet Take 2 tablets by mouth daily.     Marland Kitchen FARXIGA 10 MG TABS tablet Take 10 mg by mouth daily.    . furosemide (LASIX) 40 MG tablet Take 1.5 tablets (60 mg total) daily. 135 tablet 3  . glipiZIDE (GLUCOTROL XL) 10 MG 24 hr tablet Take 1 tablet by mouth 2 (two) times daily.     . hydrOXYzine (ATARAX/VISTARIL) 25 MG tablet Take 25 mg by mouth daily.    . nitroGLYCERIN (NITROSTAT) 0.4 MG SL tablet Place 1 tablet (0.4 mg total) under the tongue every 5 (five) minutes as needed for chest pain (up to  3 doses MAX). 25 tablet 2  . ONE TOUCH ULTRA TEST test strip     . oxyCODONE-acetaminophen (PERCOCET) 10-325 MG per tablet Take 1 tablet by mouth every 6 (six) hours as needed for pain.     . pantoprazole (PROTONIX) 40 MG tablet Take 40 mg by mouth daily.     . pioglitazone (ACTOS) 30 MG tablet Take 30 mg by mouth daily.    . pregabalin (LYRICA) 75 MG capsule Take 75 mg by mouth 2 (two) times daily.    . sitaGLIPtan-metformin (JANUMET) 50-1000 MG per tablet Take 1 tablet by mouth 2 (two) times daily with a meal.     . spironolactone (ALDACTONE) 25 MG tablet Take 1 tablet (25 mg total) daily by mouth. 30 tablet 6  . Vitamin D, Ergocalciferol, (DRISDOL) 50000 units CAPS capsule Take 50,000 Units by mouth once a week.    . losartan  (COZAAR) 50 MG tablet Take 1 tablet (50 mg total) by mouth daily. 90 tablet 3   No current facility-administered medications for this visit.     Allergies:   Duloxetine hcl and Penicillins   Social History:  The patient  reports that she has quit smoking. She smoked 0.50 packs per day. she has never used smokeless tobacco. She reports that she drinks alcohol. She reports that she does not use drugs.   Family History:  The patient's family history includes Breast cancer in her paternal aunt; CAD in her mother; Cancer in her father; Kidney cancer in her paternal aunt; Other (age of onset: 29) in her father; Throat cancer in her paternal uncle.    ROS:  Please see the history of present illness.  Otherwise, review of systems is positive for back pain, muscle pain, rash, excessive fatigue, leg pain, anxiety, balance problems.  All other systems are reviewed and negative.    PHYSICAL EXAM: VS:  BP (!) 116/56   Pulse 85   Ht 5\' 4"  (1.626 m)   Wt 186 lb 6.4 oz (84.6 kg)   BMI 32.00 kg/m  , BMI Body mass index is 32 kg/m. GEN: Well nourished, well developed, in no acute distress  HEENT: normal  Neck: no JVD, no masses. No carotid bruits Cardiac: RRR without murmur or gallop                Respiratory:  clear to auscultation bilaterally, normal work of breathing GI: soft, nontender, nondistended, + BS MS: no deformity or atrophy  Ext: trace bilateral pretibial edema, pedal pulses 2+= bilaterally Skin: warm and dry, no rash Neuro:  Strength and sensation are intact Psych: euthymic mood, full affect  EKG:  EKG is not ordered today.  Recent Labs: 12/29/2016: BUN 9; Creatinine, Ser 0.82; Potassium 4.2; Sodium 142   Lipid Panel     Component Value Date/Time   CHOL 117 04/04/2010 0935   TRIG 151.0 (H) 04/04/2010 0935   HDL 31.70 (L) 04/04/2010 0935   CHOLHDL 4 04/04/2010 0935   VLDL 30.2 04/04/2010 0935   LDLCALC 55 04/04/2010 0935   LDLDIRECT 71.4 09/17/2009 1044      Wt  Readings from Last 3 Encounters:  06/13/17 186 lb 6.4 oz (84.6 kg)  12/05/16 176 lb 12.8 oz (80.2 kg)  10/24/16 179 lb 6.4 oz (81.4 kg)     Cardiac Studies Reviewed: Echocardiogram Jan 01, 2015: Study Conclusions  - Left ventricle: The cavity size was moderately dilated. Systolic   function was moderately reduced. The estimated ejection fraction   was  in the range of 35% to 40%. Akinesis of the   mid-apicalanteroseptal and apical myocardium. Doppler parameters   are consistent with abnormal left ventricular relaxation (grade 1   diastolic dysfunction).  ASSESSMENT AND PLAN: 1.  Chronic systolic heart failure, New York Heart Association functional class II symptoms: The patient is unable to get Emily Mays because of cost issues outlined above.  We will start her back on losartan 50 mg daily.  Continue Spironolactone, digoxin, and carvedilol.  Advised that she have a digoxin level checked tomorrow with her scheduled labs.  Last echo reviewed as above.  Discussed sodium restriction.  Have reviewed her thoracic impedance data. Volume status looks ok by exam today.  2.  Coronary artery disease, native vessel, without angina: She remains on long-term dual antiplatelet therapy with aspirin and clopidogrel.  No medicine changes are made today.  3.  Hypertension: Blood pressure is controlled on her current regimen.  4.  Type 2 diabetes: Treated with multidrug oral hypoglycemic agents.  Managed by her primary physician.  Current medicines are reviewed with the patient today.  The patient does not have concerns regarding medicines.  Labs/ tests ordered today include:   Orders Placed This Encounter  Procedures  . EKG 12-Lead    Disposition:   FU 6 months  Signed, Sherren Mocha, MD  06/13/2017 9:32 PM    Emily Mays [address], [place], [place]  [address] Phone: [phone]; Fax: [phone]

## 2017-06-14 NOTE — Progress Notes (Signed)
No ICM remote transmission received for 05/29/2017 and next ICM transmission scheduled for 07/09/2017.

## 2017-07-09 ENCOUNTER — Telehealth: Payer: Self-pay | Admitting: Cardiology

## 2017-07-09 NOTE — Telephone Encounter (Signed)
Spoke with pt and reminded pt of remote transmission that is due today. Pt verbalized understanding.   

## 2017-07-24 NOTE — Progress Notes (Signed)
No ICM remote transmission received for 07/09/2017 and next ICM transmission scheduled for 08/09/2017.

## 2017-08-03 ENCOUNTER — Telehealth: Payer: Self-pay | Admitting: Cardiology

## 2017-08-03 NOTE — Telephone Encounter (Addendum)
Spoke w/ pt and requested that she send a manual transmission b/c her home monitor has not updated in at least 7 days.   

## 2017-08-09 ENCOUNTER — Telehealth: Payer: Self-pay

## 2017-08-09 ENCOUNTER — Ambulatory Visit (INDEPENDENT_AMBULATORY_CARE_PROVIDER_SITE_OTHER): Payer: BLUE CROSS/BLUE SHIELD

## 2017-08-09 DIAGNOSIS — I5042 Chronic combined systolic (congestive) and diastolic (congestive) heart failure: Secondary | ICD-10-CM

## 2017-08-09 DIAGNOSIS — Z95810 Presence of automatic (implantable) cardiac defibrillator: Secondary | ICD-10-CM

## 2017-08-09 NOTE — Telephone Encounter (Signed)
Attempted ICM call to patient to request to send manual transmission and no answer or answering machine.

## 2017-08-10 ENCOUNTER — Telehealth: Payer: Self-pay

## 2017-08-10 NOTE — Progress Notes (Signed)
EPIC Encounter for ICM Monitoring  Patient Name: Emily Mays is a 56 y.o. female Date: 08/10/2017 Primary Care Physican: Raelyn Number, MD Primary Cardiologist:Cooper/Weaver PA Electrophysiologist: Allred Dry Weight:unknown        Attempted call to patient and unable to reach.   Transmission reviewed.    Thoracic impedance normal but was abnormal suggesting fluid accumulation from 07/27/2017 to 08/07/2017.  Prescribed dosage: Furosemide 40 mg to 1.5 tablets (60 mg total) daily  Recommendations: NONE - Unable to reach.  Follow-up plan: ICM clinic phone appointment on 09/10/2017.  Overdue to make an appointment with Dr Rayann Heman (message sent to scheduler).  Last office appointment with Dr Rayann Heman was 09/06/2015  Copy of ICM check sent to Dr. Rayann Heman.   3 month ICM trend: 08/09/2017    1 Year ICM trend:       [name], RN 08/10/2017 8:04 AM

## 2017-08-10 NOTE — Telephone Encounter (Signed)
Remote ICM transmission received.  Attempted call to patient and unable to leave a message on cell phone due to no voice mail set up and no answer on home phone.

## 2017-08-23 ENCOUNTER — Telehealth: Payer: Self-pay | Admitting: Cardiology

## 2017-08-23 NOTE — Telephone Encounter (Signed)
Spoke w/ pt and requested that she send a manual transmission b/c her home monitor has not updated in at least 7 days.   

## 2017-09-07 ENCOUNTER — Telehealth: Payer: Self-pay | Admitting: Cardiology

## 2017-09-07 NOTE — Telephone Encounter (Signed)
Attempted to call pt b/c her home monitor has not updated in at least 7 days. No answer and unable to leave a message.

## 2017-09-10 ENCOUNTER — Telehealth: Payer: Self-pay

## 2017-09-10 NOTE — Telephone Encounter (Signed)
Attempted ICM call to request to send manual transmission and cell phone does not have voice mail set up and no answer on home phone.

## 2017-09-14 NOTE — Progress Notes (Signed)
No ICM remote transmission received for 09/10/2017 and next ICM transmission scheduled for 10/25/2017.  Defib office check with Dr Rayann Heman 09/24/2017.

## 2017-09-24 ENCOUNTER — Encounter: Payer: BLUE CROSS/BLUE SHIELD | Admitting: Internal Medicine

## 2017-09-28 ENCOUNTER — Telehealth: Payer: Self-pay | Admitting: Cardiology

## 2017-09-28 NOTE — Telephone Encounter (Signed)
Spoke w/ pt son and requested that she send a manual transmission b/c her home monitor has not updated in at least 14 days.   

## 2017-10-24 ENCOUNTER — Other Ambulatory Visit: Payer: Self-pay | Admitting: Physician Assistant

## 2017-10-25 ENCOUNTER — Telehealth: Payer: Self-pay | Admitting: Cardiology

## 2017-10-25 NOTE — Telephone Encounter (Signed)
Spoke with pt and reminded pt of remote transmission that is due today. Pt verbalized understanding.   

## 2017-10-26 NOTE — Progress Notes (Signed)
No ICM remote transmission received for 10/25/2017 and next ICM transmission scheduled for 11/08/2017.

## 2017-11-02 ENCOUNTER — Telehealth: Payer: Self-pay | Admitting: Cardiology

## 2017-11-02 NOTE — Telephone Encounter (Signed)
Spoke w/ pt and requested that she send a manual transmission b/c her home monitor has not updated in at least 7 days.   

## 2017-11-12 NOTE — Progress Notes (Signed)
No ICM remote transmission received for 11/08/2017 and next ICM transmission scheduled for 11/29/2017.

## 2017-11-17 IMAGING — CT CT SHOULDER*R* W/CM
1 series · 12 of 14 positions shown, 15 images · non-contrast
Comparison: None.

CLINICAL DATA: Right shoulder pain and decreased range of motion
for 2-3 years, worse over the past few months. Remote history of
shoulder dislocation. No recent injury.

EXAM:
CT ARTHROGRAPHY OF THE RIGHT SHOULDER
TECHNIQUE: Multidetector CT imaging was performed following the standard
protocol after injection of dilute contrast into the joint.

[Series 2: soft tissue · axial · 0.49mm/px · z∈[-258,-66]mm · 12 of 114 slices shown, 15 images]
[im 9/114  soft-tissue]
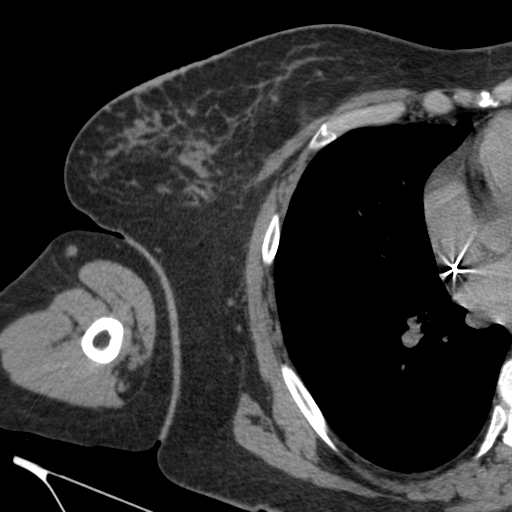
[im 9/114  bone]
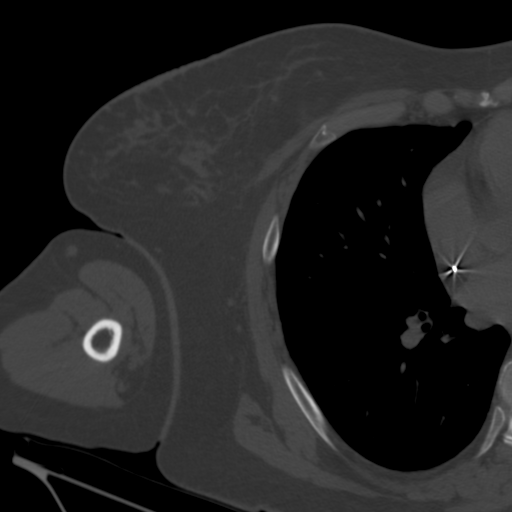
[im 18/114  bone]
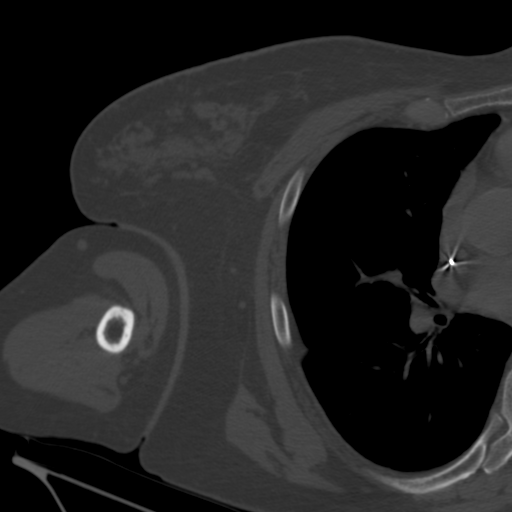
[im 27/114  bone]
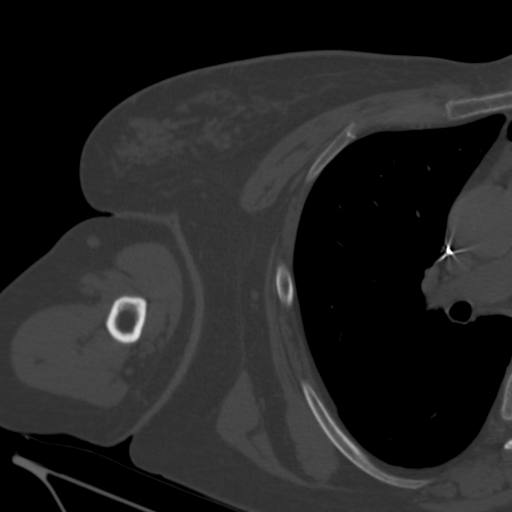
[im 35/114  bone]
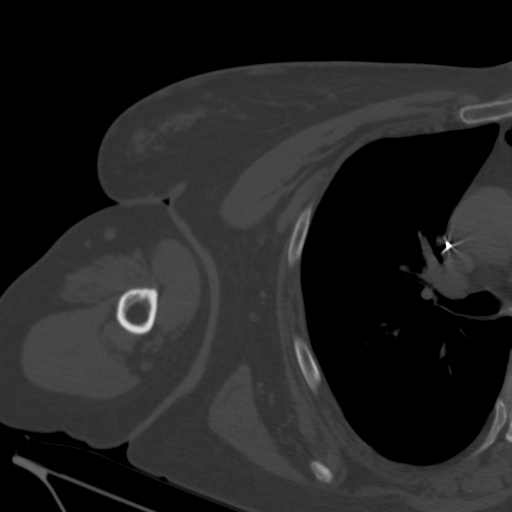
[im 44/114  soft-tissue]
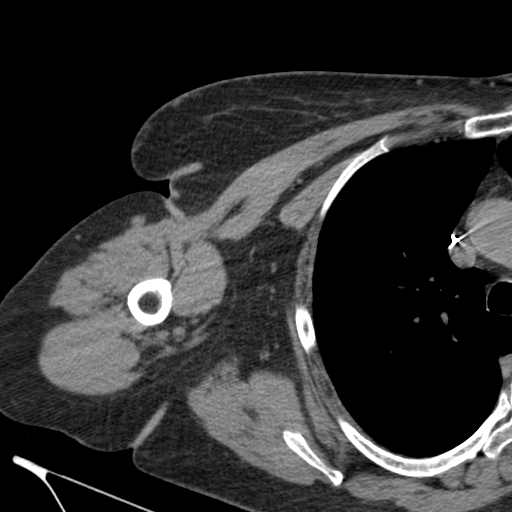
[im 44/114  bone]
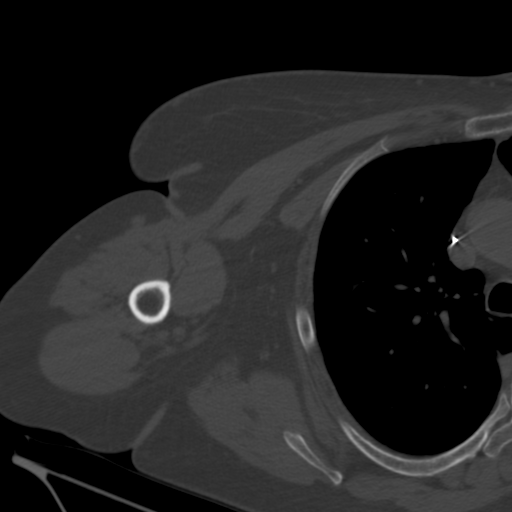
[im 53/114  bone]
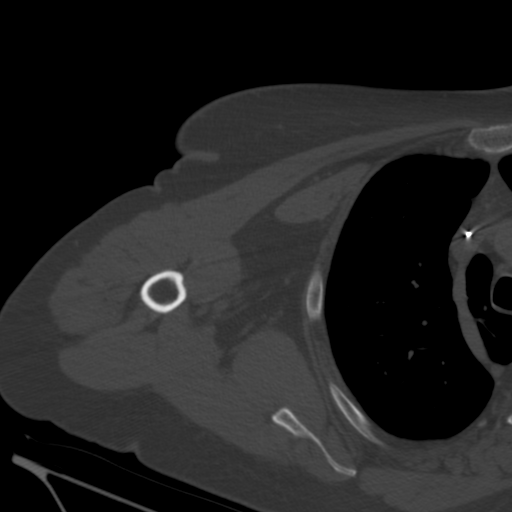
[im 61/114  bone]
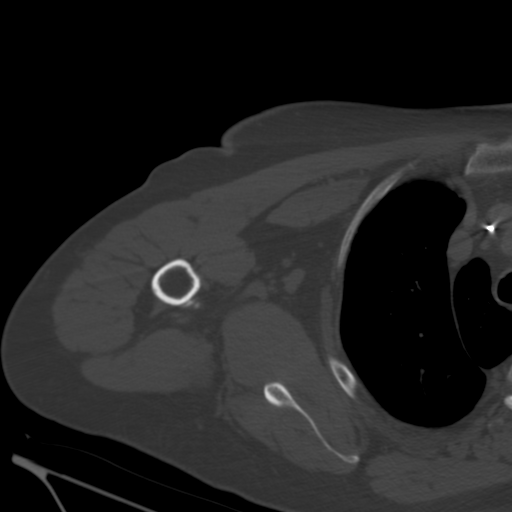
[im 70/114  bone]
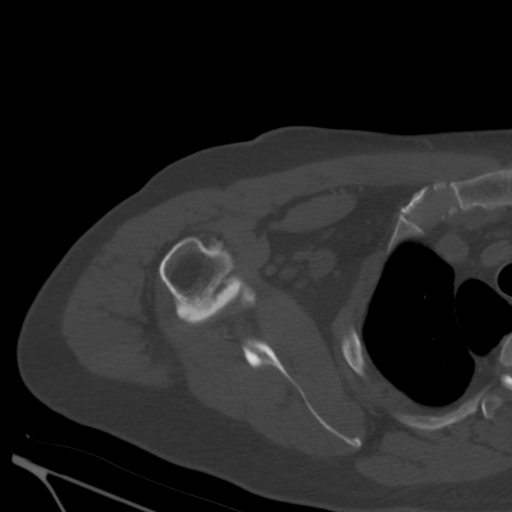
[im 79/114  soft-tissue]
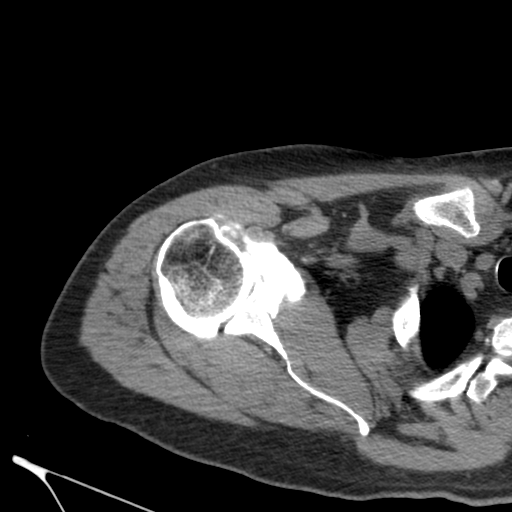
[im 79/114  bone]
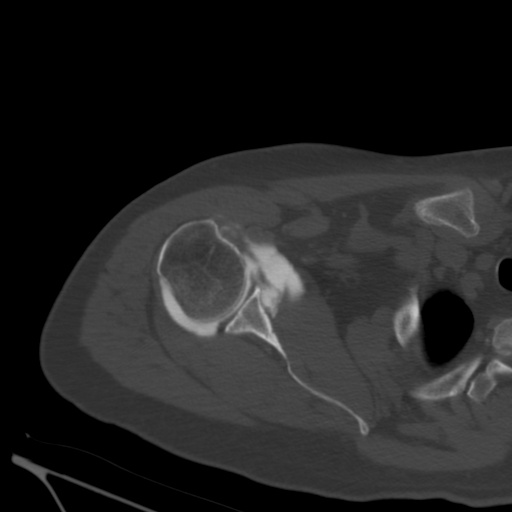
[im 87/114  bone]
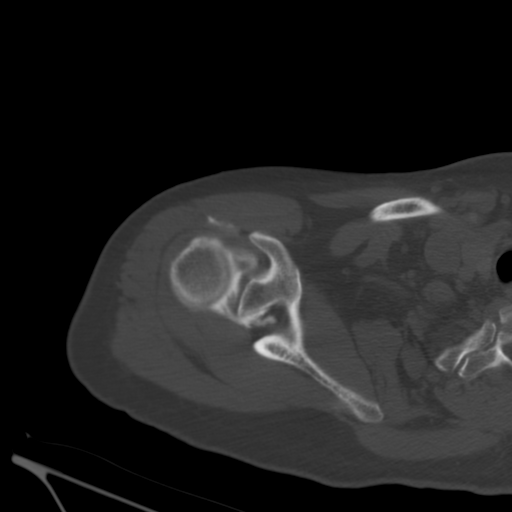
[im 96/114  bone]
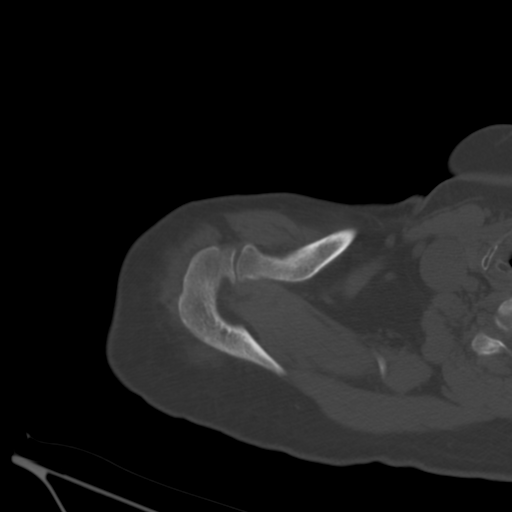
[im 105/114  bone]
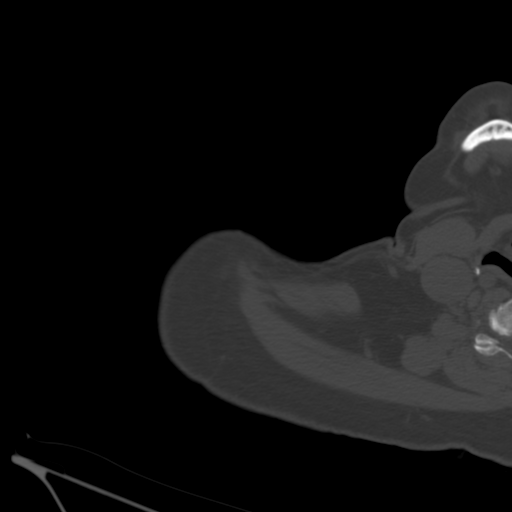

[12 of 14 positions shown; findings below may reference images not displayed]

FINDINGS: Bones/Joint/Cartilage

No fracture or focal bony lesion is identified. Articular cartilage
of the glenohumeral joint is preserved. The patient has moderate to
moderately severe acromioclavicular osteoarthritis resulting in some
mass effect on the supraspinatus muscle belly. The patient has a
type 1 acromion. The labrum is intact. Small superior sublabral
recess is noted.

Ligaments

Suboptimally assessed by CT.

Muscles and Tendons

The rotator cuff is intact. The long head of biceps tendon and
biceps anchor are also intact. No contrast extravasates into the
subacromial/subdeltoid bursa.

Soft tissues

Lung apices demonstrate some emphysematous disease but are clear.
Azygos fissure incidentally noted.
IMPRESSION: Negative for rotator cuff, long head of biceps or labral tear.

Acromioclavicular osteoarthritis results in some mass effect on the
supraspinatus.

## 2017-11-21 ENCOUNTER — Other Ambulatory Visit: Payer: Self-pay | Admitting: Physician Assistant

## 2017-11-21 DIAGNOSIS — I5042 Chronic combined systolic (congestive) and diastolic (congestive) heart failure: Secondary | ICD-10-CM

## 2017-11-29 ENCOUNTER — Telehealth: Payer: Self-pay

## 2017-11-29 NOTE — Telephone Encounter (Signed)
Attempted to confirm remote transmission with pt. No answer and was unable to leave a message.   

## 2017-11-30 NOTE — Progress Notes (Signed)
No ICM remote transmission received for 11/29/2017 and next ICM transmission scheduled for 01/10/2018.  Patient has defib office check 12/10/2017 with Dr Rayann Heman

## 2017-12-07 ENCOUNTER — Telehealth: Payer: Self-pay | Admitting: Cardiology

## 2017-12-07 NOTE — Telephone Encounter (Signed)
Spoke w/ pt and requested that she send a manual transmission b/c her home monitor has not updated in at least 7 days.   

## 2017-12-10 ENCOUNTER — Ambulatory Visit (INDEPENDENT_AMBULATORY_CARE_PROVIDER_SITE_OTHER): Payer: BLUE CROSS/BLUE SHIELD | Admitting: Internal Medicine

## 2017-12-10 ENCOUNTER — Encounter: Payer: Self-pay | Admitting: Internal Medicine

## 2017-12-10 VITALS — BP 90/50 | HR 80 | Ht 64.0 in | Wt 190.0 lb

## 2017-12-10 DIAGNOSIS — C541 Malignant neoplasm of endometrium: Secondary | ICD-10-CM | POA: Insufficient documentation

## 2017-12-10 DIAGNOSIS — Z95810 Presence of automatic (implantable) cardiac defibrillator: Secondary | ICD-10-CM | POA: Diagnosis not present

## 2017-12-10 DIAGNOSIS — I5042 Chronic combined systolic (congestive) and diastolic (congestive) heart failure: Secondary | ICD-10-CM

## 2017-12-10 DIAGNOSIS — I10 Essential (primary) hypertension: Secondary | ICD-10-CM | POA: Diagnosis not present

## 2017-12-10 DIAGNOSIS — N909 Noninflammatory disorder of vulva and perineum, unspecified: Secondary | ICD-10-CM | POA: Insufficient documentation

## 2017-12-10 DIAGNOSIS — I255 Ischemic cardiomyopathy: Secondary | ICD-10-CM | POA: Diagnosis not present

## 2017-12-10 DIAGNOSIS — N762 Acute vulvitis: Secondary | ICD-10-CM | POA: Insufficient documentation

## 2017-12-10 LAB — CUP PACEART INCLINIC DEVICE CHECK
Battery Remaining Longevity: 66 mo
Brady Statistic RV Percent Paced: 0 %
HighPow Impedance: 64.125
Implantable Lead Implant Date: 20101228
Implantable Lead Location: 753860
Implantable Lead Model: 7122
Implantable Pulse Generator Implant Date: 20150102
Lead Channel Pacing Threshold Amplitude: 1.25 V
Lead Channel Pacing Threshold Pulse Width: 0.5 ms
Lead Channel Pacing Threshold Pulse Width: 0.5 ms
Lead Channel Setting Pacing Pulse Width: 0.5 ms
Lead Channel Setting Sensing Sensitivity: 0.5 mV
MDC IDC MSMT LEADCHNL RV IMPEDANCE VALUE: 775 Ohm
MDC IDC MSMT LEADCHNL RV PACING THRESHOLD AMPLITUDE: 1.25 V
MDC IDC MSMT LEADCHNL RV SENSING INTR AMPL: 12 mV
MDC IDC SESS DTM: 20190729153848
MDC IDC SET LEADCHNL RV PACING AMPLITUDE: 2.5 V
Pulse Gen Serial Number: 7152745

## 2017-12-10 NOTE — Patient Instructions (Addendum)
Medication Instructions:  Your physician recommends that you continue on your current medications as directed. Please refer to the Current Medication list given to you today.  Labwork: None ordered.  Testing/Procedures: None ordered.  Follow-Up: Your physician wants you to follow-up in: one year with Tommye Standard, PA.   You will receive a reminder letter in the mail two months in advance. If you don't receive a letter, please call our office to schedule the follow-up appointment.  Remote monitoring is used to monitor your ICD from home. This monitoring reduces the number of office visits required to check your device to one time per year. It allows [place] to keep an eye on the functioning of your device to ensure it is working properly. You are scheduled for a device check from home on 01/10/2018. You may send your transmission at any time that day. If you have a wireless device, the transmission will be sent automatically. After your physician reviews your transmission, you will receive a postcard with your next transmission date.  Any Other Special Instructions Will Be Listed Below (If Applicable).  If you need a refill on your cardiac medications before your next appointment, please call your pharmacy.

## 2017-12-10 NOTE — Progress Notes (Signed)
PCP: Raelyn Number, MD Primary Cardiologist: Dr Burt Knack Primary EP: Dr Rayann Heman  Emily Mays is a 57 y.o. female who presents today for routine electrophysiology followup.  Since last being seen in our clinic, the patient reports doing very well.  Today, she denies symptoms of palpitations, chest pain, shortness of breath,  lower extremity edema, dizziness, presyncope, syncope, or ICD shocks.  The patient is otherwise without complaint today.   Past Medical History:  Diagnosis Date  . Anemia   . 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 s/p MI 07/24/08 with PCI Endeavor DES to Prox. LAD and BMA to prox. LCX.. relook cath 07/28/08 showing cont. patency of LAD to LCX.  Marland Kitchen Chronic systolic HF (heart failure) ([place])   . DDD (degenerative disc disease)   . Esophageal reflux   . GERD (gastroesophageal reflux disease)   . Hypokalemia   . Other specified forms of chronic ischemic heart disease    echo 07/26/2008 EfF 30% with perapical HK  . Paralytic ileus ([place])   . Unspecified essential hypertension    Past Surgical History:  Procedure Laterality Date  . ABDOMINAL HYSTERECTOMY    . BILATERAL SALPINGOOPHORECTOMY    . CARDIAC CATHETERIZATION  March 2010   2 cardiac stent placement in March 2010.   Marland Kitchen CARDIAC DEFIBRILLATOR PLACEMENT  december 2010   St. Jude  . CHOLECYSTECTOMY     27 years ago  . IMPLANTABLE CARDIOVERTER DEFIBRILLATOR GENERATOR CHANGE N/A 05/16/2013   Generator change (SJM Fortify Assura VR) for prior device malfunction (capacitors unable to charge)  . left pelvic lymphadenectomy     by Dr. Marti Sleigh   . TOTAL ABDOMINAL HYSTERECTOMY W/ BILATERAL SALPINGOOPHORECTOMY      ROS- all systems are reviewed and negative except as per HPI above  Current Outpatient Medications  Medication Sig Dispense Refill  . aspirin EC 81 MG tablet Take 81 mg by mouth daily.    . Black Cohosh 40 MG CAPS Take 40 mg by mouth 2 (two)  times daily.    . carvedilol (COREG) 25 MG tablet TAKE 1 TABLET BY MOUTH TWICE A DAY WITH A MEAL 60 tablet 9  . clopidogrel (PLAVIX) 75 MG tablet Take 1 tablet (75 mg total) by mouth at bedtime. 90 tablet 3  . DIGOX 125 MCG tablet Take 2 tablets by mouth daily.     Marland Kitchen FARXIGA 10 MG TABS tablet Take 10 mg by mouth daily.    . furosemide (LASIX) 40 MG tablet TAKE 1 AND 1/2 TABLETS BY MOUTH EVERY DAY 135 tablet 1  . glipiZIDE (GLUCOTROL XL) 10 MG 24 hr tablet Take 1 tablet by mouth 2 (two) times daily.     . hydrOXYzine (ATARAX/VISTARIL) 25 MG tablet Take 25 mg by mouth 2 (two) times daily as needed.     Marland Kitchen losartan (COZAAR) 50 MG tablet Take 1 tablet (50 mg total) by mouth daily. 90 tablet 3  . nitroGLYCERIN (NITROSTAT) 0.4 MG SL tablet Place 1 tablet (0.4 mg total) under the tongue every 5 (five) minutes as needed for chest pain (up to 3 doses MAX). 25 tablet 2  . ONE TOUCH ULTRA TEST test strip     . oxyCODONE-acetaminophen (PERCOCET) 10-325 MG per tablet Take 1 tablet by mouth every 6 (six) hours as needed for pain.     . pantoprazole (PROTONIX) 40 MG tablet Take 40 mg by mouth daily.     . pioglitazone (ACTOS) 30  MG tablet Take 30 mg by mouth daily.    . pregabalin (LYRICA) 75 MG capsule Take 75 mg by mouth 2 (two) times daily.    . sitaGLIPtan-metformin (JANUMET) 50-1000 MG per tablet Take 1 tablet by mouth 2 (two) times daily with a meal.     . spironolactone (ALDACTONE) 25 MG tablet Take 1 tablet (25 mg total) daily by mouth. 30 tablet 6  . Vitamin D, Ergocalciferol, (DRISDOL) 50000 units CAPS capsule Take 50,000 Units by mouth 2 (two) times a week.      No current facility-administered medications for this visit.     Physical Exam: Vitals:   12/10/17 1401  BP: (!) 90/50  Pulse: 80  Weight: 190 lb (86.2 kg)  Height: 5\' 4"  (1.626 m)    GEN- The patient is well appearing, alert and oriented x 3 today.   Head- normocephalic, atraumatic Eyes-  Sclera clear, conjunctiva pink Ears-  hearing intact Oropharynx- clear Lungs- Clear to ausculation bilaterally, normal work of breathing Chest- ICD pocket is well healed Heart- Regular rate and rhythm, no murmurs, rubs or gallops, PMI not laterally displaced GI- soft, NT, ND, + BS Extremities- no clubbing, cyanosis, or edema  ICD interrogation- reviewed in detail today,  See PACEART report  ekg tracing ordered today is personally reviewed and shows sinus rhythm 80 bpm, PR 172 msec, QRS 92 msec, Qtc 389 msec, anteroseptal infarct pattern  Wt Readings from Last 3 Encounters:  12/10/17 190 lb (86.2 kg)  06/13/17 186 lb 6.4 oz (84.6 kg)  12/05/16 176 lb 12.8 oz (80.2 kg)    Assessment and Plan:  1.  Chronic systolic dysfunction/ CAD/ ischemic CM euvolemic today No ischemic symptoms Stable on an appropriate medical regimen Normal ICD function See [facility] Art report No changes today followed in ICM device clinic  2. HTN Stable No change required today  We have had difficulty with her device follow-up due to issues with her land line.  She reports that she has good cell reception at her house.  We will therefore get her a cell adapter for her device. Merlin Return to see EP PA every year I will see when needed  Thompson Grayer MD, [facility] 12/10/2017 2:15 PM

## 2017-12-14 ENCOUNTER — Telehealth: Payer: Self-pay

## 2017-12-14 NOTE — Telephone Encounter (Signed)
Attempted to confirm remote transmission with pt. No answer and was unable to leave a message.  Monitor has not updated in 7 days.

## 2018-01-07 ENCOUNTER — Telehealth: Payer: Self-pay | Admitting: Internal Medicine

## 2018-01-07 NOTE — Telephone Encounter (Signed)
Hi, Emily Mays with patient about this process. She stated she was taking herself out of work for disability. And once she got all paperwork she would distribute a copy to [place] for signing. I made her aware if he see's no cardiac reason for her to be put on disability paperwork may not get signed.

## 2018-01-07 NOTE — Telephone Encounter (Signed)
New Message:      Pt is calling and would like to let us know that she is filing for disability. Pt states she needs to know how this process works and which doctor would have to fill out the papers and is there someone else in the office that she needs to talk to for assistance.

## 2018-01-07 NOTE — Telephone Encounter (Signed)
Will await completed paperwork for Dr. Burt Knack to review.

## 2018-01-10 ENCOUNTER — Ambulatory Visit (INDEPENDENT_AMBULATORY_CARE_PROVIDER_SITE_OTHER): Payer: Self-pay | Admitting: *Deleted

## 2018-01-10 ENCOUNTER — Telehealth: Payer: Self-pay

## 2018-01-10 DIAGNOSIS — I255 Ischemic cardiomyopathy: Secondary | ICD-10-CM

## 2018-01-10 NOTE — Telephone Encounter (Signed)
I spoke with the patient about this.

## 2018-01-11 ENCOUNTER — Encounter: Payer: Self-pay | Admitting: Cardiology

## 2018-01-11 NOTE — Progress Notes (Signed)
Remote ICD transmission.   

## 2018-01-18 ENCOUNTER — Telehealth: Payer: Self-pay

## 2018-01-18 NOTE — Telephone Encounter (Signed)
Attempted to confirm remote transmission with pt. No answer and was unable to leave a message.  Pt monitor has not updated within 7 days.  

## 2018-01-21 ENCOUNTER — Telehealth: Payer: Self-pay

## 2018-01-21 NOTE — Telephone Encounter (Signed)
Spoke with pt and reminded pt of remote transmission that is due today. Pt verbalized understanding.   

## 2018-01-29 NOTE — Progress Notes (Signed)
No ICM remote transmission received for 01/21/2018 and next ICM transmission scheduled for 02/14/2018.

## 2018-02-11 LAB — CUP PACEART REMOTE DEVICE CHECK
Battery Remaining Longevity: 68 mo
Battery Voltage: 2.96 V
Brady Statistic RV Percent Paced: 1 %
Date Time Interrogation Session: 20190829210846
HighPow Impedance: 75 Ohm
HighPow Impedance: 75 Ohm
Implantable Lead Implant Date: 20101228
Implantable Lead Location: 753860
Implantable Lead Model: 7122
Lead Channel Pacing Threshold Amplitude: 1.25 V
Lead Channel Sensing Intrinsic Amplitude: 12 mV
Lead Channel Setting Pacing Pulse Width: 0.5 ms
Lead Channel Setting Sensing Sensitivity: 0.5 mV
MDC IDC MSMT BATTERY REMAINING PERCENTAGE: 62 %
MDC IDC MSMT LEADCHNL RV IMPEDANCE VALUE: 850 Ohm
MDC IDC MSMT LEADCHNL RV PACING THRESHOLD PULSEWIDTH: 0.5 ms
MDC IDC PG IMPLANT DT: 20150102
MDC IDC PG SERIAL: 7152745
MDC IDC SET LEADCHNL RV PACING AMPLITUDE: 2.5 V

## 2018-02-14 ENCOUNTER — Telehealth: Payer: Self-pay | Admitting: Cardiology

## 2018-02-14 NOTE — Telephone Encounter (Signed)
Spoke with pt and reminded pt of remote transmission that is due today. Pt verbalized understanding.   

## 2018-02-25 NOTE — Progress Notes (Signed)
No ICM remote transmission received for 02/14/2018 and next ICM transmission scheduled for 03/05/2018.

## 2018-03-05 ENCOUNTER — Telehealth: Payer: Self-pay | Admitting: Cardiology

## 2018-03-05 NOTE — Telephone Encounter (Signed)
Spoke with pt and reminded pt of remote transmission that is due today. Pt verbalized understanding.   

## 2018-03-07 NOTE — Progress Notes (Signed)
No ICM remote transmission received for 03/05/2018 and next ICM transmission scheduled for 03/18/2018.

## 2018-03-08 ENCOUNTER — Telehealth: Payer: Self-pay

## 2018-03-08 NOTE — Telephone Encounter (Signed)
Attempted to confirm remote transmission with pt. No answer and was unable to leave a message.  Pt monitor has not updated within 7 days.  

## 2018-03-15 ENCOUNTER — Telehealth: Payer: Self-pay | Admitting: Cardiology

## 2018-03-15 NOTE — Telephone Encounter (Signed)
Spoke w/ pt and requested that she send a manual transmission b/c her home monitor has not updated in at least 7 days.   

## 2018-03-18 ENCOUNTER — Ambulatory Visit (INDEPENDENT_AMBULATORY_CARE_PROVIDER_SITE_OTHER): Payer: Self-pay

## 2018-03-18 DIAGNOSIS — Z95810 Presence of automatic (implantable) cardiac defibrillator: Secondary | ICD-10-CM

## 2018-03-18 DIAGNOSIS — I5042 Chronic combined systolic (congestive) and diastolic (congestive) heart failure: Secondary | ICD-10-CM

## 2018-03-19 NOTE — Progress Notes (Signed)
Unable to reach patient since February and will disenroll in [facility] clinic.  Device clinic will continue to follow every 3 months per protocol.

## 2018-04-18 ENCOUNTER — Telehealth: Payer: Self-pay

## 2018-04-18 NOTE — Telephone Encounter (Signed)
Spoke with pt and reminded pt of remote transmission that is due today. Pt verbalized understanding.   

## 2018-04-23 ENCOUNTER — Encounter: Payer: Self-pay | Admitting: Cardiology

## 2018-04-25 ENCOUNTER — Telehealth: Payer: Self-pay | Admitting: Cardiology

## 2018-04-25 NOTE — Telephone Encounter (Signed)
Spoke w/ pt and requested that she send a manual transmission b/c her home monitor has not updated in at least 7 days.   

## 2018-05-03 ENCOUNTER — Telehealth: Payer: Self-pay

## 2018-05-03 NOTE — Telephone Encounter (Signed)
Attempted to confirm remote transmission with pt. No answer and was unable to leave a message.  Pt monitor has not updated within 7 days.

## 2018-05-06 ENCOUNTER — Other Ambulatory Visit: Payer: Self-pay | Admitting: Physician Assistant

## 2018-05-06 DIAGNOSIS — I5042 Chronic combined systolic (congestive) and diastolic (congestive) heart failure: Secondary | ICD-10-CM

## 2018-07-09 ENCOUNTER — Other Ambulatory Visit: Payer: Self-pay | Admitting: Physician Assistant

## 2018-09-26 ENCOUNTER — Other Ambulatory Visit: Payer: Self-pay

## 2018-09-26 ENCOUNTER — Ambulatory Visit (INDEPENDENT_AMBULATORY_CARE_PROVIDER_SITE_OTHER): Payer: Self-pay | Admitting: *Deleted

## 2018-09-26 DIAGNOSIS — I255 Ischemic cardiomyopathy: Secondary | ICD-10-CM

## 2018-09-27 ENCOUNTER — Telehealth: Payer: Self-pay

## 2018-09-27 NOTE — Telephone Encounter (Signed)
Left message for patient to remind of missed remote transmission.  

## 2018-09-30 LAB — CUP PACEART REMOTE DEVICE CHECK
Date Time Interrogation Session: 20200518170638
Implantable Lead Implant Date: 20101228
Implantable Lead Location: 753860
Implantable Lead Model: 7122
Implantable Pulse Generator Implant Date: 20150102
Pulse Gen Serial Number: 7152745

## 2018-10-01 ENCOUNTER — Other Ambulatory Visit: Payer: Self-pay | Admitting: Physician Assistant

## 2018-10-03 ENCOUNTER — Encounter: Payer: Self-pay | Admitting: Cardiology

## 2018-10-03 NOTE — Progress Notes (Signed)
Remote ICD transmission.   

## 2018-12-26 ENCOUNTER — Encounter: Payer: Self-pay | Admitting: *Deleted

## 2019-01-06 ENCOUNTER — Encounter: Payer: Self-pay | Admitting: Cardiology

## 2019-01-08 ENCOUNTER — Ambulatory Visit (INDEPENDENT_AMBULATORY_CARE_PROVIDER_SITE_OTHER): Payer: Self-pay | Admitting: *Deleted

## 2019-01-08 DIAGNOSIS — I255 Ischemic cardiomyopathy: Secondary | ICD-10-CM

## 2019-01-10 LAB — CUP PACEART REMOTE DEVICE CHECK
Battery Remaining Longevity: 59 mo
Battery Remaining Percentage: 54 %
Battery Voltage: 2.95 V
Brady Statistic RV Percent Paced: 1 %
Date Time Interrogation Session: 20200827004456
HighPow Impedance: 75 Ohm
HighPow Impedance: 75 Ohm
Implantable Lead Implant Date: 20101228
Implantable Lead Location: 753860
Implantable Lead Model: 7122
Implantable Pulse Generator Implant Date: 20150102
Lead Channel Impedance Value: 850 Ohm
Lead Channel Sensing Intrinsic Amplitude: 12 mV
Lead Channel Setting Pacing Amplitude: 2.5 V
Lead Channel Setting Pacing Pulse Width: 0.5 ms
Lead Channel Setting Sensing Sensitivity: 0.5 mV
Pulse Gen Serial Number: 7152745

## 2019-01-21 ENCOUNTER — Encounter: Payer: Self-pay | Admitting: Cardiology

## 2019-01-21 NOTE — Progress Notes (Signed)
Remote ICD transmission.   

## 2019-03-29 ENCOUNTER — Other Ambulatory Visit: Payer: Self-pay | Admitting: Physician Assistant

## 2019-04-23 ENCOUNTER — Other Ambulatory Visit: Payer: Self-pay

## 2019-04-23 ENCOUNTER — Ambulatory Visit (INDEPENDENT_AMBULATORY_CARE_PROVIDER_SITE_OTHER): Payer: Self-pay | Admitting: Physician Assistant

## 2019-04-23 ENCOUNTER — Encounter: Payer: Self-pay | Admitting: Physician Assistant

## 2019-04-23 VITALS — BP 98/58 | HR 82 | Ht 64.0 in | Wt 185.4 lb

## 2019-04-23 DIAGNOSIS — I10 Essential (primary) hypertension: Secondary | ICD-10-CM

## 2019-04-23 DIAGNOSIS — Z79899 Other long term (current) drug therapy: Secondary | ICD-10-CM

## 2019-04-23 DIAGNOSIS — I5042 Chronic combined systolic (congestive) and diastolic (congestive) heart failure: Secondary | ICD-10-CM

## 2019-04-23 DIAGNOSIS — I255 Ischemic cardiomyopathy: Secondary | ICD-10-CM

## 2019-04-23 DIAGNOSIS — I2510 Atherosclerotic heart disease of native coronary artery without angina pectoris: Secondary | ICD-10-CM

## 2019-04-23 NOTE — Patient Instructions (Signed)
Medication Instructions:  Please call our office when you get home so we can go over your medications    *If you need a refill on your cardiac medications before your next appointment, please call your pharmacy*  Lab Work: TODAY: BMET & DIGOXIN LEVEL   If you have labs (blood work) drawn today and your tests are completely normal, you will receive your results only by: Marland Kitchen MyChart Message (if you have MyChart) OR . A paper copy in the mail If you have any lab test that is abnormal or we need to change your treatment, we will call you to review the results.  Testing/Procedures: None   Follow-Up: At [facility], you and your health needs are our priority.  As part of our continuing mission to provide you with exceptional heart care, we have created designated Provider Care Teams.  These Care Teams include your primary Cardiologist (physician) and Advanced Practice Providers (APPs -  Physician Assistants and Nurse Practitioners) who all work together to provide you with the care you need, when you need it.  Your next appointment:   12 month(s)  The format for your next appointment:   In Person  Provider:   You may see Sherren Mocha, MD or one of the following Advanced Practice Providers on your designated Care Team:    Richardson Dopp, PA-C  [place], [place]  Daune Perch, NP   Other Instructions

## 2019-04-23 NOTE — Progress Notes (Signed)
Cardiology Office Note    Date:  04/23/2019   ID:  Emily Mays, DOB [birth year], MRN [id number]  PCP:  Bonnita Nasuti, MD  Primary Cardiologist: Dr Burt Knack Primary EP: Dr Rayann Heman  Chief Complaint:  22 Months follow up  History of Present Illness:   Emily Mays is a 58 y.o. female with hx of CAD with coronary dissection, chronic systolic CHF, DM, HLD and HTN seen for follow up.   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 This required extensive stenting of the LAD and left circumflex. She went on to develop a severe ischemic cardiomyopathy and was treated with an ICD for primary prevention of sudden cardiac death. Last seen by Dr. Burt Knack 05/2017. Unable to afford Entresto>> started losartan. Reduce digoxin to daily. Device function normal by last check 12/2018.  Seen for follow up.  Patient denies chest pain, shortness of breath, orthopnea, PND, syncope, lower extremity edema or melena.  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  She reports that she never cut back on digoxin, she is taking twice a day.  She requested she is back on Entresto.  Medication provided by PCP with assistant.  She does not remember list of her all medications.    Past Medical History:  Diagnosis Date  . Anemia   . 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 s/p MI 07/24/08 with PCI Endeavor DES to Prox. LAD and BMA to prox. LCX.. relook cath 07/28/08 showing cont. patency of LAD to LCX.  Marland Kitchen Chronic systolic HF (heart failure) ([place])   . DDD (degenerative disc disease)   . Esophageal reflux   . GERD (gastroesophageal reflux disease)   . Hypokalemia   . Other specified forms of chronic ischemic heart disease    echo 07/26/2008 EfF 30% with perapical HK  . Paralytic ileus ([place])   . Unspecified essential hypertension     Past Surgical History:  Procedure Laterality Date  . ABDOMINAL HYSTERECTOMY    . BILATERAL  SALPINGOOPHORECTOMY    . CARDIAC CATHETERIZATION  March 2010   2 cardiac stent placement in March 2010.   Marland Kitchen CARDIAC DEFIBRILLATOR PLACEMENT  december 2010   St. Jude  . CHOLECYSTECTOMY     27 years ago  . IMPLANTABLE CARDIOVERTER DEFIBRILLATOR GENERATOR CHANGE N/A 05/16/2013   Generator change (SJM Fortify Assura VR) for prior device malfunction (capacitors unable to charge)  . left pelvic lymphadenectomy     by Dr. Marti Sleigh   . TOTAL ABDOMINAL HYSTERECTOMY W/ BILATERAL SALPINGOOPHORECTOMY      Current Medications: Prior to Admission medications   Medication Sig Start Date End Date Taking? Authorizing Provider  aspirin EC 81 MG tablet Take 81 mg by mouth daily.    [provider]  Black Cohosh 40 MG CAPS Take 40 mg by mouth 2 (two) times daily.    [provider]  carvedilol (COREG) 25 MG tablet Take 1 tablet (25 mg total) by mouth 2 (two) times daily with a meal. Please make overdue appt with Dr. Rayann Heman before anymore refills. 1st attempt 03/31/19   Richardson Dopp T, PA-C  clopidogrel (PLAVIX) 75 MG tablet Take 1 tablet (75 mg total) by mouth at bedtime. 12/14/14   Burtis Junes, NP  DIGOX 125 MCG tablet Take 2 tablets by mouth daily.  11/24/14   [provider]  FARXIGA 10 MG TABS tablet Take 10 mg by mouth daily. 11/27/14  [provider]  furosemide (LASIX) 40 MG tablet Take 1.5 tablets (60 mg total) by mouth daily. TAKE 1 1/2 TABLETS BY MOUTH EVERY DAY 05/06/18   Sherren Mocha, MD  glipiZIDE (GLUCOTROL XL) 10 MG 24 hr tablet Take 1 tablet by mouth 2 (two) times daily.  08/06/15   [provider]  hydrOXYzine (ATARAX/VISTARIL) 25 MG tablet Take 25 mg by mouth 2 (two) times daily as needed.  05/09/17   [provider]  losartan (COZAAR) 50 MG tablet Take 1 tablet (50 mg total) by mouth daily. 06/13/17 06/08/18  Sherren Mocha, MD  nitroGLYCERIN (NITROSTAT) 0.4 MG SL tablet Place 1 tablet (0.4 mg total) under the tongue  every 5 (five) minutes as needed for chest pain (up to 3 doses MAX). 07/24/16   Sherren Mocha, MD  ONE TOUCH ULTRA TEST test strip  11/27/14   [provider]  oxyCODONE-acetaminophen (PERCOCET) 10-325 MG per tablet Take 1 tablet by mouth every 6 (six) hours as needed for pain.  11/14/14   [provider]  pantoprazole (PROTONIX) 40 MG tablet Take 40 mg by mouth daily.     [provider]  pioglitazone (ACTOS) 30 MG tablet Take 30 mg by mouth daily.    [provider]  pregabalin (LYRICA) 75 MG capsule Take 75 mg by mouth 2 (two) times daily.    [provider]  sitaGLIPtan-metformin (JANUMET) 50-1000 MG per tablet Take 1 tablet by mouth 2 (two) times daily with a meal.     [provider]  spironolactone (ALDACTONE) 25 MG tablet Take 1 tablet (25 mg total) daily by mouth. 03/21/17   Sherren Mocha, MD  Vitamin D, Ergocalciferol, (DRISDOL) 50000 units CAPS capsule Take 50,000 Units by mouth 2 (two) times a week.  06/05/17   [provider]    Allergies:   Duloxetine hcl and Penicillins   Social History   Socioeconomic History  . Marital status: Single    Spouse name: Not on file  . Number of children: Not on file  . Years of education: Not on file  . Highest education level: Not on file  Occupational History  . Occupation: Glass blower/designer in Cabin crew: [employer]  Social Needs  . Financial resource strain: Not on file  . Food insecurity    Worry: Not on file    Inability: Not on file  . Transportation needs    Medical: Not on file    Non-medical: Not on file  Tobacco Use  . Smoking status: Former Smoker    Packs/day: 0.50  . Smokeless tobacco: Never Used  . Tobacco comment: used to smoke 1/2 ppd but has not smoked since discharge.   Substance and Sexual Activity  . Alcohol use: Yes    Comment: Occasional   . Drug use: No  . Sexual activity: Never  Lifestyle  . Physical activity    Days per week:  Not on file    Minutes per session: Not on file  . Stress: Not on file  Relationships  . Social Herbalist on phone: Not on file    Gets together: Not on file    Attends religious service: Not on file    Active member of club or organization: Not on file    Attends meetings of clubs or organizations: Not on file    Relationship status: Not on file  Other Topics Concern  . Not on file  Social History Narrative  Works as a Glass blower/designer in a [employer]. Lives in [place] with a roommate.      Family History:  The patient's family history includes Breast cancer in her paternal aunt; CAD in her mother; Cancer in her father; Kidney cancer in her paternal aunt; Other (age of onset: 93) in her father; Throat cancer in her paternal uncle.   ROS:   Please see the history of present illness.    ROS All other systems reviewed and are negative.   PHYSICAL EXAM:   VS:  BP (!) 98/58   Pulse 82   Ht 5\' 4"  (1.626 m)   Wt 185 lb 6.4 oz (84.1 kg)   SpO2 96%   BMI 31.82 kg/m    GEN: Well nourished, well developed, in no acute distress  HEENT: normal  Neck: no JVD, carotid bruits, or masses Cardiac: RRR; no murmurs, rubs, or gallops,no edema  Respiratory:  clear to auscultation bilaterally, normal work of breathing GI: soft, nontender, nondistended, + BS MS: no deformity or atrophy  Skin: warm and dry, no rash Neuro:  Alert and Oriented x 3, Strength and sensation are intact Psych: euthymic mood, full affect  Wt Readings from Last 3 Encounters:  04/23/19 185 lb 6.4 oz (84.1 kg)  12/10/17 190 lb (86.2 kg)  06/13/17 186 lb 6.4 oz (84.6 kg)      Studies/Labs Reviewed:   EKG:  EKG is not  ordered today.   Recent Labs: No results found for requested labs within last 8760 hours.   Lipid Panel    Component Value Date/Time   CHOL 117 04/04/2010 0935   TRIG 151.0 (H) 04/04/2010 0935   HDL 31.70 (L) 04/04/2010 0935   CHOLHDL 4 04/04/2010 0935   VLDL 30.2  04/04/2010 0935   LDLCALC 55 04/04/2010 0935   LDLDIRECT 71.4 09/17/2009 1044    Additional studies/ records that were reviewed today include:   Echocardiogram: 12/2014 Study Conclusions  - Left ventricle: The cavity size was moderately dilated. Systolic   function was moderately reduced. The estimated ejection fraction   was in the range of 35% to 40%. Akinesis of the   mid-apicalanteroseptal and apical myocardium. Doppler parameters   are consistent with abnormal left ventricular relaxation (grade 1   diastolic dysfunction).    ASSESSMENT & PLAN:    1. CAD No angina.  Continue current medical therapy.  2. Chronic systolic CHF Euvolemic.  Patient continue to take twice daily dose of digoxin to daily during last office visit.  Will recheck labs today.  3. HTN -Blood pressure soft low.  No change in therapy today.  4. HLD - No results found for requested labs within last 8760 hours.  -Unsure if she is taking or not.  5. DM -Managed by PCP.  Check digoxin level and kidney function.  Update medication list (she will call us) and make adjustment based on that.    Medication Adjustments/Labs and Tests Ordered: Current medicines are reviewed at length with the patient today.  Concerns regarding medicines are outlined above.  Medication changes, Labs and Tests ordered today are listed in the Patient Instructions below. Patient Instructions  Medication Instructions:  Please call our office when you get home so we can go over your medications    *If you need a refill on your cardiac medications before your next appointment, please call your pharmacy*  Lab Work: TODAY: BMET & DIGOXIN LEVEL   If you have labs (blood work) drawn today and your tests  are completely normal, you will receive your results only by: Marland Kitchen MyChart Message (if you have MyChart) OR . A paper copy in the mail If you have any lab test that is abnormal or we need to change your treatment, we will call  you to review the results.  Testing/Procedures: None   Follow-Up: At [facility], you and your health needs are our priority.  As part of our continuing mission to provide you with exceptional heart care, we have created designated Provider Care Teams.  These Care Teams include your primary Cardiologist (physician) and Advanced Practice Providers (APPs -  Physician Assistants and Nurse Practitioners) who all work together to provide you with the care you need, when you need it.  Your next appointment:   12 month(s)  The format for your next appointment:   In Person  Provider:   You may see Sherren Mocha, MD or one of the following Advanced Practice Providers on your designated Care Team:    Richardson Dopp, PA-C  [place], [place]  Daune Perch, NP   Other Instructions      Signed, Leanor Kail, [place]  04/23/2019 4:05 PM    [facility] [place], [place],   [address] Phone: [phone]; Fax: [phone]

## 2019-04-24 ENCOUNTER — Telehealth: Payer: Self-pay | Admitting: Cardiovascular Disease

## 2019-04-24 DIAGNOSIS — Z95810 Presence of automatic (implantable) cardiac defibrillator: Secondary | ICD-10-CM

## 2019-04-24 DIAGNOSIS — I2510 Atherosclerotic heart disease of native coronary artery without angina pectoris: Secondary | ICD-10-CM

## 2019-04-24 LAB — BASIC METABOLIC PANEL
BUN/Creatinine Ratio: 7 — ABNORMAL LOW (ref 9–23)
BUN: 6 mg/dL (ref 6–24)
CO2: 26 mmol/L (ref 20–29)
Calcium: 8.7 mg/dL (ref 8.7–10.2)
Chloride: 95 mmol/L — ABNORMAL LOW (ref 96–106)
Creatinine, Ser: 0.91 mg/dL (ref 0.57–1.00)
GFR calc Af Amer: 82 mL/min/{1.73_m2} (ref >59)
GFR calc non Af Amer: 71 mL/min/{1.73_m2} (ref >59)
Glucose: 273 mg/dL — ABNORMAL HIGH (ref 65–99)
Potassium: 3.6 mmol/L (ref 3.5–5.2)
Sodium: 139 mmol/L (ref 134–144)

## 2019-04-24 LAB — DIGOXIN LEVEL: Digoxin, Serum: 1.6 ng/mL — ABNORMAL HIGH (ref 0.5–0.9)

## 2019-04-24 NOTE — Telephone Encounter (Signed)
New message   Patient would like a call in reference to her medication DIGOX 125 MCG tablet. Patient has a question about the dosage of the medication. Please call.

## 2019-04-24 NOTE — Telephone Encounter (Signed)
Discussed patient's medications in detail.  Changes to highlight: She IS taking: Digoxin 125 mcg twice daily Entresto 24-26 mg BID  She is NOT taking: Losartan  Zocor   Medication list updated. See SnapShot for further details.

## 2019-04-28 ENCOUNTER — Other Ambulatory Visit: Payer: Self-pay

## 2019-04-28 DIAGNOSIS — I2510 Atherosclerotic heart disease of native coronary artery without angina pectoris: Secondary | ICD-10-CM

## 2019-04-28 DIAGNOSIS — Z95810 Presence of automatic (implantable) cardiac defibrillator: Secondary | ICD-10-CM

## 2019-04-28 MED ORDER — DIGOX 125 MCG PO TABS: 0.1250 mg | ORAL_TABLET | Freq: Every day | ORAL | 3 refills | Status: DC

## 2019-04-28 MED ORDER — CARVEDILOL 25 MG PO TABS: 25.0000 mg | ORAL_TABLET | Freq: Two times a day (BID) | ORAL | 3 refills | Status: DC

## 2019-04-28 MED ORDER — ATORVASTATIN CALCIUM 40 MG PO TABS: 40.0000 mg | ORAL_TABLET | Freq: Every day | ORAL | 3 refills | Status: DC

## 2019-04-28 NOTE — Telephone Encounter (Signed)
-----   Message from [place], [place] sent at 04/25/2019 11:06 AM EST ----- Please start Lipitor 40mg  daily  Get lipid panel and LFTs in 8 weeks Digoxin as Dr. Burt Knack recommended  ----- Message ----- From: Sherren Mocha, MD Sent: 04/24/2019  11:17 PM EST To: Theodoro Parma, RN, Leanor Kail, PA  Would hold x 3 days, then start back on 0.125 mg once daily. Repeat Dig level in 6 weeks. thanks

## 2019-04-28 NOTE — Telephone Encounter (Signed)
Follow Up  Patient returning call she states from Friday 04/25/19. Please give patient a call back.

## 2019-04-28 NOTE — Telephone Encounter (Signed)
See recent lab results. Instructed patient to HOLD DIGOXIN for 3 days then resume 0.125 mg daily. Instructed her to START LIPITOR 40 mg daily. She will have digoxin level, lipids, and LFTs checked Jun 16, 2019 in [place]. She was grateful for assistance.

## 2019-05-12 ENCOUNTER — Other Ambulatory Visit: Payer: Self-pay | Admitting: Cardiovascular Disease

## 2019-05-12 DIAGNOSIS — I5042 Chronic combined systolic (congestive) and diastolic (congestive) heart failure: Secondary | ICD-10-CM

## 2019-10-09 ENCOUNTER — Telehealth: Payer: Self-pay

## 2019-10-09 NOTE — Telephone Encounter (Signed)
Unable to speak  with patient to remind of missed remote transmission 

## 2019-10-29 ENCOUNTER — Emergency Department (HOSPITAL_COMMUNITY): Payer: Self-pay

## 2019-10-29 ENCOUNTER — Other Ambulatory Visit: Payer: Self-pay

## 2019-10-29 ENCOUNTER — Emergency Department (HOSPITAL_COMMUNITY)
Admission: EM | Admit: 2019-10-29 | Discharge: 2019-10-29 | Disposition: A | Discharge status: Home / Self Care | Payer: Self-pay | Attending: Emergency Medicine | Admitting: Emergency Medicine

## 2019-10-29 DIAGNOSIS — Y9389 Activity, other specified: Secondary | ICD-10-CM | POA: Insufficient documentation

## 2019-10-29 DIAGNOSIS — Y999 Unspecified external cause status: Secondary | ICD-10-CM | POA: Insufficient documentation

## 2019-10-29 DIAGNOSIS — S82851A Displaced trimalleolar fracture of right lower leg, initial encounter for closed fracture: Secondary | ICD-10-CM | POA: Insufficient documentation

## 2019-10-29 DIAGNOSIS — W1839XA Other fall on same level, initial encounter: Secondary | ICD-10-CM | POA: Insufficient documentation

## 2019-10-29 DIAGNOSIS — I2510 Atherosclerotic heart disease of native coronary artery without angina pectoris: Secondary | ICD-10-CM | POA: Insufficient documentation

## 2019-10-29 DIAGNOSIS — Y92009 Unspecified place in unspecified non-institutional (private) residence as the place of occurrence of the external cause: Secondary | ICD-10-CM | POA: Insufficient documentation

## 2019-10-29 DIAGNOSIS — I110 Hypertensive heart disease with heart failure: Secondary | ICD-10-CM | POA: Insufficient documentation

## 2019-10-29 DIAGNOSIS — I10 Essential (primary) hypertension: Secondary | ICD-10-CM | POA: Insufficient documentation

## 2019-10-29 DIAGNOSIS — I5042 Chronic combined systolic (congestive) and diastolic (congestive) heart failure: Secondary | ICD-10-CM | POA: Insufficient documentation

## 2019-10-29 LAB — CBC
HCT: 37.1 % (ref 36.0–46.0)
Hemoglobin: 11.7 g/dL — ABNORMAL LOW (ref 12.0–15.0)
MCH: 30.1 pg (ref 26.0–34.0)
MCHC: 31.5 g/dL (ref 30.0–36.0)
MCV: 95.4 fL (ref 80.0–100.0)
Platelets: 193 10*3/uL (ref 150–400)
RBC: 3.89 MIL/uL (ref 3.87–5.11)
RDW: 14.6 % (ref 11.5–15.5)
WBC: 8.2 10*3/uL (ref 4.0–10.5)
nRBC: 0 % (ref 0.0–0.2)

## 2019-10-29 LAB — BASIC METABOLIC PANEL
Anion gap: 15 (ref 5–15)
BUN: 18 mg/dL (ref 6–20)
CO2: 25 mmol/L (ref 22–32)
Calcium: 9.5 mg/dL (ref 8.9–10.3)
Chloride: 98 mmol/L (ref 98–111)
Creatinine, Ser: 1.35 mg/dL — ABNORMAL HIGH (ref 0.44–1.00)
GFR calc Af Amer: 51 mL/min — ABNORMAL LOW (ref >60)
GFR calc non Af Amer: 44 mL/min — ABNORMAL LOW (ref >60)
Glucose, Bld: 282 mg/dL — ABNORMAL HIGH (ref 70–99)
Potassium: 4 mmol/L (ref 3.5–5.1)
Sodium: 138 mmol/L (ref 135–145)

## 2019-10-29 MED ORDER — FENTANYL CITRATE (PF) 100 MCG/2ML IJ SOLN
50.0000 ug | Freq: Once | INTRAMUSCULAR | Status: AC
  Administered 2019-10-29: 50 ug via INTRAVENOUS
  Filled 2019-10-29: qty 2

## 2019-10-29 MED ORDER — OXYCODONE-ACETAMINOPHEN 5-325 MG PO TABS
1.0000 | ORAL_TABLET | Freq: Once | ORAL | Status: AC
  Administered 2019-10-29: 1 via ORAL
  Filled 2019-10-29: qty 1

## 2019-10-29 MED ORDER — SODIUM CHLORIDE 0.9 % IV BOLUS
1000.0000 mL | Freq: Once | INTRAVENOUS | Status: AC
  Administered 2019-10-29: 1000 mL via INTRAVENOUS

## 2019-10-29 NOTE — Discharge Instructions (Signed)
Please read and follow all provided instructions.  You have been seen today after an injury to your right ankle.  Your xray shows a fracture in multiple parts of your ankle known as a trimalleolar fracture.  Your labs show that your kidney function was mildly elevated and that you are somewhat anemic, please have these rechecked by primary care provider in 1 to 2 weeks.  Your blood pressure was noted to be low, you were given fluids with improvement of this. We have placed you in a splint- please keep this clean & dry and intact until you have followed up with orthopedics. Do not put any weight on this extremity, use the crutches as instructed. Please call orthopedic surgery tomorrow to schedule an appointment within the next 3 days.   Home care instructions: -- *PRICE in the first 24-48 hours after injury: Protect with splint Rest Ice- Do not apply ice pack directly to your splint place towel or similar between your splint and ice/ice pack. Apply ice for 20 min, then remove for 40 min while awake Compression- splint Elevate affected extremity above the level of your heart when not walking around for the first 24-48 hours   Medications:  Please take your prescribed hydrocodone at home as you typically would as needed for pain. Follow-up instructions: Please follow-up with the orthopedic surgeon in your discharge instructions.   Return instructions:  Please return if your digits or extremity are numb or tingling, appear gray or blue, or you have severe pain (also elevate the extremity and loosen splint or wrap if you were given one) Please return if you have redness or fevers.  Please return if you have any dizziness, lightheadedness, chest pain, or shortness of breath. Please return to the Emergency Department if you experience worsening symptoms.  Please return if you have any other emergent concerns.

## 2019-10-29 NOTE — ED Provider Notes (Signed)
[facility]ERGENC[facility] DEPARTMENT Provider Note   CSN: [id number] Arrival date & time:        History Chief Complaint  Patient presents with  . Ankle Pain    Emily Mays is a 59 y.o. female with a history of CHF, CAD, anemia, GERD, and hypertension who presents to the ED s/p mechanical fall x 2 since last night with complaints of R ankle pain.  Patient states that last night she woke from sleep walk to the bathroom and as she turned the corner she was sleeping and lost her balance, she injured the right ankle with the fall but did not have any head injury or loss of consciousness.  She went back to sleep and this morning was planning to go to her primary care office to get an x-ray of the ankle, tripped in the parking lot and reinjured the ankle, again no head injury or loss of consciousness.  She denies any prodromal lightheadedness, dizziness, chest pain, or dyspnea.  She currently only has pain to the right ankle with associated swelling, worse with movement, unable to bear weight.  Denies numbness, tingling, weakness, chest pain, abdominal pain, neck pain, back pain, or open wounds.  She did take her morning medication shortly prior to arrival and has not had anything to eat or drink today other than sips with meds.    HPI     Past Medical History:  Diagnosis Date  . Anemia   . 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 s/p MI 07/24/08 with PCI Endeavor DES to Prox. LAD and BMA to prox. LCX.. relook cath 07/28/08 showing cont. patency of LAD to LCX.  Marland Kitchen Chronic systolic HF (heart failure) ([place])   . DDD (degenerative disc disease)   . Esophageal reflux   . GERD (gastroesophageal reflux disease)   . Hypokalemia   . Other specified forms of chronic ischemic heart disease    echo 07/26/2008 EfF 30% with perapical HK  . Paralytic ileus ([place])   . Unspecified essential hypertension     Patient Active Problem List    Diagnosis Date Noted  . Endometrial carcinoma ([place]) 12/10/2017  . Disorder of vulva 12/10/2017  . Vulvitis 12/10/2017  . Chronic combined systolic and diastolic CHF (congestive heart failure) ([place]) 10/24/2016  . Implantable cardioverter-defibrillator (ICD) in situ 03/18/2012  . Uterine cancer ([place]) 05/24/2011  . EDEMA 11/02/2008  . Essential hypertension 10/19/2008  . HYPERLIPIDEMIA-MIXED 10/15/2008  . TACHYCARDIA 08/19/2008  . HEARTBURN 08/19/2008  . HYPOKALEMIA 08/12/2008  . ANEMIA 08/12/2008  . CAD (coronary artery disease) 08/12/2008  . Cardiomyopathy, ischemic 08/12/2008  . SINUS TACHYCARDIA 08/12/2008  . GERD 08/12/2008  . ILEUS 08/12/2008  . Disorder of cardiovascular system 05/15/2008  . Degenerative disorder of bone 05/15/2008    Past Surgical History:  Procedure Laterality Date  . ABDOMINAL HYSTERECTOMY    . BILATERAL SALPINGOOPHORECTOMY    . CARDIAC CATHETERIZATION  March 2010   2 cardiac stent placement in March 2010.   Marland Kitchen CARDIAC DEFIBRILLATOR PLACEMENT  december 2010   [employer]  . CHOLECYSTECTOMY     27 years ago  . IMPLANTABLE CARDIOVERTER DEFIBRILLATOR GENERATOR CHANGE N/A 05/16/2013   Generator change (SJM Fortify Assura VR) for prior device malfunction (capacitors unable to charge)  . left pelvic lymphadenectomy     by Dr. Marti Sleigh   . TOTAL ABDOMINAL HYSTERECTOMY W/ BILATERAL SALPINGOOPHORECTOMY       OB History  No obstetric history on file.     Family History  Problem Relation Age of Onset  . Cancer Father        Bladder cancer  . Other Father 32       Borderline Diabetic  . CAD Mother        CAD with previous angioplasty.  . Kidney cancer Paternal Aunt   . Breast cancer Paternal Aunt   . Throat cancer Paternal Uncle     Social History   Tobacco Use  . Smoking status: Former Smoker    Packs/day: 0.50  . Smokeless tobacco: Never Used  . Tobacco comment: used to smoke 1/2 ppd but has not smoked since discharge.   Vaping  Use  . Vaping Use: Never used  Substance Use Topics  . Alcohol use: Yes    Comment: Occasional   . Drug use: No    Home Medications Prior to Admission medications   Medication Sig Start Date End Date Taking? Authorizing Provider  aspirin EC 81 MG tablet Take 81 mg by mouth daily.   Yes [provider]  atorvastatin (LIPITOR) 40 MG tablet Take 1 tablet (40 mg total) by mouth daily. 04/28/19 04/22/20 Yes Bhagat, Bhavinkumar, PA  Black Cohosh 40 MG CAPS Take 40 mg by mouth 2 (two) times daily.   Yes [provider]  carvedilol (COREG) 25 MG tablet Take 1 tablet (25 mg total) by mouth 2 (two) times daily with a meal. 04/28/19 04/22/20 Yes Bhagat, Bhavinkumar, PA  clindamycin (CLEOCIN) 300 MG capsule Take 300 mg by mouth 3 (three) times daily. X 10 days   Yes [provider]  clopidogrel (PLAVIX) 75 MG tablet Take 1 tablet (75 mg total) by mouth at bedtime. 12/14/14  Yes Burtis Junes, NP  FARXIGA 10 MG TABS tablet Take 10 mg by mouth daily. 11/27/14  Yes [provider]  furosemide (LASIX) 40 MG tablet TAKE 1 1/2 TABLETS BY MOUTH EVERY DAY Patient taking differently: Take 30 mg by mouth daily.  05/12/19  Yes Bhagat, Bhavinkumar, PA  glipiZIDE (GLUCOTROL XL) 10 MG 24 hr tablet Take 1 tablet by mouth 2 (two) times daily.  08/06/15  Yes [provider]  hydrOXYzine (ATARAX/VISTARIL) 25 MG tablet Take 25 mg by mouth every 8 (eight) hours as needed for anxiety.  05/09/17  Yes [provider]  nitroGLYCERIN (NITROSTAT) 0.4 MG SL tablet Place 1 tablet (0.4 mg total) under the tongue every 5 (five) minutes as needed for chest pain (up to 3 doses MAX). 07/24/16  Yes Sherren Mocha, MD  ondansetron (ZOFRAN) 4 MG tablet Take 4 mg by mouth 2 (two) times daily as needed for nausea or vomiting.   Yes [provider]  oxyCODONE-acetaminophen (PERCOCET) 10-325 MG per tablet Take 1 tablet by mouth every 6 (six) hours as needed for pain.  11/14/14  Yes  [provider]  pantoprazole (PROTONIX) 40 MG tablet Take 40 mg by mouth daily.    Yes [provider]  pioglitazone (ACTOS) 30 MG tablet Take 30 mg by mouth daily.   Yes [provider]  pregabalin (LYRICA) 75 MG capsule Take 75 mg by mouth 2 (two) times daily.   Yes [provider]  sacubitril-valsartan (ENTRESTO) 24-26 MG Take 1 tablet by mouth 2 (two) times daily.   Yes [provider]  sitaGLIPtan-metformin (JANUMET) 50-1000 MG per tablet Take 1 tablet by mouth 2 (two) times daily with a meal.    Yes [provider]  spironolactone (ALDACTONE) 25 MG tablet Take 1 tablet (25 mg total) daily by mouth. 03/21/17  Yes Sherren Mocha, MD  tiZANidine (ZANAFLEX) 4 MG tablet Take 4 mg by mouth every 8 (eight) hours as needed for muscle spasms.   Yes [provider]  Vitamin D, Ergocalciferol, (DRISDOL) 50000 units CAPS capsule Take 50,000 Units by mouth 2 (two) times a week.  06/05/17  Yes [provider]  [place] 125 MCG tablet Take 1 tablet (0.125 mg total) by mouth daily. Patient not taking: Reported on 10/29/2019 04/28/19 04/22/20  Leanor Kail, PA    Allergies    Duloxetine hcl and Penicillins  Review of Systems   Review of Systems  Constitutional: Negative for chills and fever.  Respiratory: Negative for shortness of breath.   Cardiovascular: Negative for chest pain.  Gastrointestinal: Negative for abdominal pain and vomiting.  Musculoskeletal: Positive for arthralgias and joint swelling. Negative for back pain and neck pain.  Neurological: Negative for dizziness, syncope, weakness, light-headedness, numbness and headaches.  All other systems reviewed and are negative.   Physical Exam Updated Vital Signs BP (!) 73/43 (BP Location: Right Arm)   Pulse 83   Temp 98.2 F (36.8 C) (Oral)   Resp 18   Ht 5\' 4"  (1.626 m)   Wt 86.2 kg   SpO2 100%   BMI 32.61 kg/m   Physical Exam Vitals and nursing note  reviewed.  Constitutional:      General: She is not in acute distress.    Appearance: She is well-developed.  HENT:     Head: Normocephalic and atraumatic. No raccoon eyes or Battle's sign.     Right Ear: No hemotympanum.     Left Ear: No hemotympanum.  Eyes:     General:        Right eye: No discharge.        Left eye: No discharge.     Conjunctiva/sclera: Conjunctivae normal.     Pupils: Pupils are equal, round, and reactive to light.  Cardiovascular:     Rate and Rhythm: Normal rate and regular rhythm.     Heart sounds: No murmur heard.      Comments: 2+ symmetric DP/PT pulses bilaterally. Pulmonary:     Effort: No respiratory distress.     Breath sounds: Normal breath sounds. No wheezing or rales.  Chest:     Chest wall: No tenderness.  Abdominal:     General: There is no distension.     Palpations: Abdomen is soft.     Tenderness: There is no abdominal tenderness. There is no guarding or rebound.  Musculoskeletal:     Cervical back: No spinous process tenderness.     Comments: No midline spinal tenderness to the neck/back. Upper extremities: No focal bony tenderness Lower extremities: Soft tissue swelling noted throughout the right ankle.  No open wounds or ecchymosis.  Intact active range of motion throughout with the exception of right ankle limitation secondary to pain.  Patient is tender to the medial and lateral malleolus of the right lower extremity, otherwise nontender, specifically no fibular head tenderness.  Compartments are soft.  Skin:    General: Skin is warm and dry.     Findings: No rash.  Neurological:     Comments: Alert.  Clear speech.  Sensation grossly intact bilateral lower extremities.  5 out of 5 symmetric strength with plantar dorsiflexion bilaterally.  Psychiatric:        Behavior: Behavior normal.    ED Results / Procedures /  Treatments   Labs (all labs ordered are listed, but only abnormal results are displayed) Labs Reviewed  CBC -  Abnormal; Notable for the following components:      Result Value   Hemoglobin 11.7 (*)    All other components within normal limits  BASIC METABOLIC PANEL - Abnormal; Notable for the following components:   Glucose, Bld 282 (*)    Creatinine, Ser 1.35 (*)    GFR calc non Af Amer 44 (*)    GFR calc Af Amer 51 (*)    All other components within normal limits    EKG None  Radiology DG Ankle Complete Right  Result Date: 10/29/2019 CLINICAL DATA:  Right ankle pain and swelling since a fall yesterday and again today. EXAM: RIGHT ANKLE - COMPLETE 3+ VIEW COMPARISON:  None. FINDINGS: There is a trimalleolar fracture of the right ankle. Minimal displacement of the posterior malleolar fracture. No dislocation. Ankle joint effusion.  Circumferential soft tissue swelling. IMPRESSION: Trimalleolar fracture. Electronically Signed   By: Lorriane Shire M.D.   On: 10/29/2019 11:32    Procedures Procedures (including critical care time)  Medications Ordered in ED Medications  fentaNYL (SUBLIMAZE) injection 50 mcg (50 mcg Intravenous Given 10/29/19 1151)  sodium chloride 0.9 % bolus 1,000 mL (1,000 mLs Intravenous New Bag/Given 10/29/19 1203)    ED Course  I have reviewed the triage vital signs and the nursing notes.  Pertinent labs & imaging results that were available during my care of the patient were reviewed by me and considered in my medical decision making (see chart for details).    MDM Rules/Calculators/A&P                         Patient presents to the ED with complaints of R ankle pain s/p mechanical fall x 2. Nontoxic, vitals per EMS report WNL.   Additional history obtained:  Additional history obtained from EMS. Previous records obtained and reviewed.   Upon arrival patient's BP reported to be 90s over 40s, on my exam heart rate is normal and she does not appear in any distress, I assessed the patient and ordered analgesics, she was taken to x-ray prior to RN assessment.    Imaging Studies ordered:  I ordered imaging studies which included R ankle x-ray, I independently visualized and interpreted imaging which showed trimalleolar fracture, no dislocation.   Upon returning from radiology department patient replaced on the monitor, noted to be hypotensive, asymptomatic on reassessment, she states that she has not had anything to eat or drink today, she took all of her medicine including anti-hypertensive meds shortly prior to arrival, will obtain basic labs and EKG.  1 L normal saline ordered.  Blood pressure (!) 73/43, pulse 83, temperature 98.2 F (36.8 C), temperature source Oral, resp. rate 18, height 5\' 4"  (1.626 m), weight 86.2 kg, SpO2 100 %.  11:50: CONSULT: Discussed case with orthopedic surgery PA-C Hilbert Odor, in agreement with posterior splint with stirrups, nonweightbearing, office follow-up.  I ordered, reviewed, and interpreted labs which include: CBC: Mild anemia, not significantly changed from prior.  No leukocytosis. BMP: Hyperglycemia without acidosis or anion gap elevation to raise concern for DKA.  Creatinine is elevated compared to prior  EKG: No significant arrhythmias.  13:15: RE-EVAL: BP 109/65, patient remains asymptomatic, upon review of prior visits BP seems to trend low [id number] systolic.   84:16: Splint applied by orthopedic technician who has taught/trained patient with crutches.  She is  ambulatory with the crutches and feels safe to go home with these, she has not had any lightheadedness or dizziness with ambulation and her blood pressure remains baseline for her..  We discussed need for nonweightbearing status, PRICE, and orthopedics follow-up, she would like to follow-up with Dr. Doran Durand specifically.  She is comfortable taking her hydrocodone for her pain at home and does not feel she needs additional analgesics. I discussed results, treatment plan, need for follow-up, and return precautions with the patient. Provided  opportunity for questions, patient confirmed understanding and is in agreement with plan.   Blood pressure (!) 118/57, pulse 91, temperature 98.2 F (36.8 C), temperature source Oral, resp. rate (!) 22, height 5\' 4"  (1.626 m), weight 86.2 kg, SpO2 100 %.  Findings and plan of care discussed with supervising physician Dr. Roslynn Amble who is in agreement.   Portions of this note were generated with [employer]. Dictation errors may occur despite best attempts at proofreading.  Final Clinical Impression(s) / ED Diagnoses Final diagnoses:  Closed trimalleolar fracture of right ankle, initial encounter    Rx / DC Orders ED Discharge Orders    None       Amaryllis Dyke, PA-C 10/29/19 1447    Lucrezia Starch, MD 10/30/19 1019

## 2019-10-29 NOTE — ED Notes (Signed)
Ortho tech notified to apply splint

## 2019-10-29 NOTE — ED Notes (Signed)
Ortho Tech in the room

## 2019-10-29 NOTE — ED Triage Notes (Signed)
Patient arrived with PTAR stating she fell yesterday and her right ankle was hurting and she fell again today and was unable to stand. Denies any LOC with either fall. Patient alert and oriented x4, skin warm and dry to touch.

## 2019-10-29 NOTE — ED Notes (Signed)
Patient voided and had a bowel movement

## 2019-10-31 ENCOUNTER — Telehealth: Payer: Self-pay | Admitting: *Deleted

## 2019-10-31 NOTE — Telephone Encounter (Signed)
   [facility] Pre-operative Risk Assessment    HEARTCARE STAFF: - Please ensure there is not already an duplicate clearance open for this procedure. - Under Visit Info/Reason for Call, type in Other and utilize the format Clearance MM/DD/YY or Clearance TBD. Do not use dashes or single digits. - If request is for dental extraction, please clarify the # of teeth to be extracted.  Request for surgical clearance:  1. What type of surgery is being performed? ORIF RIGHT ANKLE FRACTURE   2. When is this surgery scheduled? 11/11/19   3. What type of clearance is required (medical clearance vs. Pharmacy clearance to hold med vs. Both)? MEDICAL  4. Are there any medications that need to be held prior to surgery and how long? PLAVIX AND ASA   5. Practice name and name of physician performing surgery? [facility]; DR. Jenny Reichmann [place]   6. What is the office phone number? [phone]   7.   What is the office fax number? [phone] ATTN: [place]  8.   Anesthesia type (None, local, MAC, general) ? GENERAL   Julaine Hua 10/31/2019, 2:09 PM  _________________________________________________________________   (provider comments below)

## 2019-10-31 NOTE — Telephone Encounter (Signed)
   Primary [place], MD  Chart reviewed as part of pre-operative protocol coverage. Because of Remas Sobel Tinnell's past medical history and time since last visit, they will require a follow-up visit in order to better assess preoperative cardiovascular risk.  Pre-op covering staff: - Please schedule appointment and call patient to inform them. If patient already had an upcoming appointment within acceptable timeframe, please add "pre-op clearance" to the appointment notes so provider is aware. - Please contact requesting surgeon's office via preferred method (i.e, phone, fax) to inform them of need for appointment prior to surgery.  Need to confirm med list - is she taking both ASA and plavix? I was unable to reach patient or leave a VM. Please make a virtual appt. Last saw Vin Bhagat PAC.   If applicable, this message will also be routed to pharmacy pool and/or primary cardiologist for input on holding anticoagulant/antiplatelet agent as requested below so that this information is available to the clearing provider at time of patient's appointment.   Tami Lin Airlie Bl[place], PA  10/31/2019, 5:26 PM

## 2019-11-03 ENCOUNTER — Other Ambulatory Visit (HOSPITAL_COMMUNITY): Payer: Self-pay | Admitting: Orthopedic Surgery

## 2019-11-03 NOTE — Progress Notes (Signed)
Cardiology Office Note   Date:  11/04/2019   ID:  Emily Mays, DOB [birth year], MRN [id number]  PCP:  Bonnita Nasuti, MD  Cardiologist: Dr. Burt Knack Electrophysiologist: Dr. Rayann Heman CC: Pre-Operative Evaluation    History of Present Illness: Emily Mays is a 59 y.o. female who presents for ongoing assessment and management of coronary artery disease, with history of coronary artery dissection of the LAD, with extensive stenting of the LAD and left circumflex, chronic systolic CHF (echocardiogram 2016 EF of 35% to 40% with akinesis of the mid anterior lateral septal and apical myocardium.).  The patient developed severe ischemic cardiomyopathy and was treated with ICD for primary prevention of sudden cardiac death, (St Jude, inserted in June 04, 2009.).  She is currently on Entresto and is on medication assistant program to receive this.  She was last seen in the office on 04/23/2019 by Pricilla Loveless, PA.  At that time she was without any complaints or evidence of volume overload.  Digoxin level and BMET was ordered. Digoxin level 0.6, Blood glucose elevated > 200.   She is due to have ORIF of the right ankle fracture on 11/11/2019, by Dr. Wylene Simmer with [facility].  She is here for cardiac evaluation prior to surgery.  She comes today very hypotensive.  She has taken a Xanax and a pain pill prior to coming in.  On review of her medications she is also taking 2 full tablets of furosemide 40 mg (a total of 80 mg) each day instead of 60 mg.  She admits to being very dizzy and lightheaded.  She has fallen a time or two.  She is currently in a wheelchair.   Past Medical History:  Diagnosis Date   Anemia    CAD (coronary artery disease)    spontaneous LAD dissection presenting with anterior STEMI. s/p MI 07/24/08 with PCI Endeavor DES to Prox. LAD and BMA to prox. LCX.. relook cath 07/28/08 showing cont. patency of LAD to LCX.   Chronic systolic HF (heart failure) (HCC)    DDD  (degenerative disc disease)    Esophageal reflux    GERD (gastroesophageal reflux disease)    Hypokalemia    Other specified forms of chronic ischemic heart disease    echo 07/26/2008 EfF 30% with perapical HK   Paralytic ileus [facility])    Unspecified essential hypertension     Past Surgical History:  Procedure Laterality Date   ABDOMINAL HYSTERECTOMY     BILATERAL SALPINGOOPHORECTOMY     CARDIAC CATHETERIZATION  March 2010   2 cardiac stent placement in March 2010.    CARDIAC DEFIBRILLATOR PLACEMENT  Jun 04, 2009  St. Jude   CHOLECYSTECTOMY     27 years ago   IMPLANTABLE CARDIOVERTER DEFIBRILLATOR GENERATOR CHANGE N/A 05/16/2013   Generator change (SJM Fortify Assura VR) for prior device malfunction (capacitors unable to charge)   left pelvic lymphadenectomy     by Dr. Marti Sleigh    TOTAL ABDOMINAL HYSTERECTOMY W/ BILATERAL SALPINGOOPHORECTOMY       Current Outpatient Medications  Medication Sig Dispense Refill   aspirin EC 81 MG tablet Take 81 mg by mouth daily.     atorvastatin (LIPITOR) 40 MG tablet Take 1 tablet (40 mg total) by mouth daily. 90 tablet 3   Black Cohosh 40 MG CAPS Take 40 mg by mouth 2 (two) times daily.     carvedilol (COREG) 25 MG tablet Take 1 tablet (25 mg total) by mouth 2 (two) times daily with a  meal. 180 tablet 3   clindamycin (CLEOCIN) 300 MG capsule Take 300 mg by mouth 3 (three) times daily. X 10 days     clopidogrel (PLAVIX) 75 MG tablet Take 1 tablet (75 mg total) by mouth at bedtime. 90 tablet 3   DIGOX 125 MCG tablet Take 1 tablet (0.125 mg total) by mouth daily. 90 tablet 3   FARXIGA 10 MG TABS tablet Take 10 mg by mouth daily.     glipiZIDE (GLUCOTROL XL) 10 MG 24 hr tablet Take 1 tablet by mouth 2 (two) times daily.      hydrOXYzine (ATARAX/VISTARIL) 25 MG tablet Take 25 mg by mouth every 8 (eight) hours as needed for anxiety.      nitroGLYCERIN (NITROSTAT) 0.4 MG SL tablet Place 1 tablet (0.4 mg total)  under the tongue every 5 (five) minutes as needed for chest pain (up to 3 doses MAX). 25 tablet 2   ondansetron (ZOFRAN) 4 MG tablet Take 4 mg by mouth 2 (two) times daily as needed for nausea or vomiting.     oxyCODONE-acetaminophen (PERCOCET) 10-325 MG per tablet Take 1 tablet by mouth every 6 (six) hours as needed for pain.      pantoprazole (PROTONIX) 40 MG tablet Take 40 mg by mouth daily.      pioglitazone (ACTOS) 30 MG tablet Take 30 mg by mouth daily.     pregabalin (LYRICA) 75 MG capsule Take 75 mg by mouth 2 (two) times daily.     sacubitril-valsartan (ENTRESTO) 24-26 MG Take 1 tablet by mouth 2 (two) times daily.     sitaGLIPtan-metformin (JANUMET) 50-1000 MG per tablet Take 1 tablet by mouth 2 (two) times daily with a meal.      Vitamin D, Ergocalciferol, (DRISDOL) 50000 units CAPS capsule Take 50,000 Units by mouth 2 (two) times a week.      No current facility-administered medications for this visit.    Allergies:   Duloxetine hcl and Penicillins    Social History:  The patient  reports that she has quit smoking. She smoked 0.50 packs per day. She has never used smokeless tobacco. She reports current alcohol use. She reports that she does not use drugs.   Family History:  The patient's family history includes Breast cancer in her paternal aunt; CAD in her mother; Cancer in her father; Kidney cancer in her paternal aunt; Other (age of onset: 66) in her father; Throat cancer in her paternal uncle.    ROS: All other systems are reviewed and negative. Unless otherwise mentioned in H&P    PHYSICAL EXAM: VS:  BP (!) 69/47    Pulse 88    Ht 5\' 4"  (1.626 m)    Wt 190 lb (86.2 kg)    SpO2 98%    BMI 32.61 kg/m  , BMI Body mass index is 32.61 kg/m. GEN: Well nourished, well developed, in no acute distress HEENT: normal Neck: no JVD, carotid bruits, or masses Cardiac:RRR; no murmurs, rubs, or gallops,no edema  Respiratory:  Clear to auscultation bilaterally, normal work of  breathing GI: soft, nontender, nondistended, + BS MS: no deformity or atrophy.  Right foot and ankle and Ace wrap elevated. Skin: warm and dry, no rash Neuro:  Strength and sensation are intact Psych: euthymic mood, full affect   EKG: Normal sinus rhythm, anteroseptal abnormalities.  Heart rate 89 bpm.  Low voltage (personally reviewed).   Recent Labs: 10/29/2019: BUN 18; Creatinine, Ser 1.35; Hemoglobin 11.7; Platelets 193; Potassium 4.0; Sodium 138  Lipid Panel    Component Value Date/Time   CHOL 117 04/04/2010 0935   TRIG 151.0 (H) 04/04/2010 0935   HDL 31.70 (L) 04/04/2010 0935   CHOLHDL 4 04/04/2010 0935   VLDL 30.2 04/04/2010 0935   LDLCALC 55 04/04/2010 0935   LDLDIRECT 71.4 09/17/2009 1044      Wt Readings from Last 3 Encounters:  11/04/19 190 lb (86.2 kg)  10/29/19 190 lb (86.2 kg)  04/23/19 185 lb 6.4 oz (84.1 kg)      Other studies Reviewed: Echocardiogram Jan 09, 2015 Left ventricle: The cavity size was moderately dilated. Systolic  function was moderately reduced. The estimated ejection fraction  was in the range of 35% to 40%. Akinesis of the  mid-apicalanteroseptal and apical myocardium. Doppler parameters  are consistent with abnormal left ventricular relaxation (grade 1  diastolic dysfunction).    ASSESSMENT AND PLAN:  1.  Cardiology preop evaluation: Due to significant hypotension.  The patient is not cleared to proceed with repair of her right ankle.  We have notified [facility] to let them know that she will need further testing and medication adjustments before clearing her for surgery.  2.  Hypotension: Multifactorial.  She has been overdosing herself on the Lasix tablet.  She states instead of cutting the second 1 and half she goes ahead and takes the full tablet for a total of 80 mg instead of 60 mg as directed.  She says she has trouble cutting the pills in half.  I am going to stop the Lasix for a couple of days.  Order a  BMET.  If she begins to have some fluid retention she is okay to take 1 dose of 40 mg.  She is to call us to let us know.  I am also going to stop spironolactone.  She is also taking pain medication and antianxiety medication which may also decrease blood pressure as a side effect.  She will see [place] in a couple weeks to discuss her response to medication changes.  3.  Systolic CHF: She remains on Entresto 2426 mg and carvedilol 25 mg twice daily.  I am going to repeat her echocardiogram as one has not been completed since 2016 to evaluate if LV function has improved and therefore medication adjustments can be directed.  3.  Coronary artery disease: History of coronary artery dissection of the LAD with extensive stenting of the LAD and left circumflex in 2016.  She continues on aspirin and clopidogrel.  No complaints of excessive bleeding or bruising.  4.  Hypercholesterolemia: Continue atorvastatin 40 mg daily.  She will need to follow-up with lipids and LFTs by PCP or by [place] on follow-up appointment if not completed  5.  ICD in situ: Pepper Pike inserted December 2010.  Follow-up with remote interrogation per protocol.   Current medicines are reviewed at length with the patient today.  I have spent 35 minutes dedicated to the care of this patient on the date of this encounter to include pre-visit review of records, assessment, management and diagnostic testing,with shared decision making.  Labs/ tests ordered today include: BMET, echocardiogram Emily Mays, ANP, AACC   11/04/2019 5:18 PM    [facility] [address] Office [phone] Fax [phone]  Notice: This dictation was prepared with Dragon dictation along with smaller phrase technology. Any transcriptional errors that result from this process are unintentional and may not be corrected upon review.

## 2019-11-03 NOTE — Telephone Encounter (Signed)
I s/w the pt and she is agreeable to pre op appt. I did not have any appt's in the [facility] office before the pt's surgery on 11/11/19. I have made appt with Jory Sims, DNP for tomorrow 11/04/19 @ 3:15 at our [facility] office. Pt is grateful for all of the help. I will forward clearance notes to DNP for appt. I will remove from the pre op call back.  I will send FYI to surgeon pt has appt 11/04/19.

## 2019-11-04 ENCOUNTER — Ambulatory Visit (INDEPENDENT_AMBULATORY_CARE_PROVIDER_SITE_OTHER): Payer: Self-pay | Admitting: Adult Health

## 2019-11-04 ENCOUNTER — Encounter: Payer: Self-pay | Admitting: Adult Health

## 2019-11-04 ENCOUNTER — Other Ambulatory Visit: Payer: Self-pay

## 2019-11-04 VITALS — BP 69/47 | HR 88 | Ht 64.0 in | Wt 190.0 lb

## 2019-11-04 DIAGNOSIS — S82891S Other fracture of right lower leg, sequela: Secondary | ICD-10-CM

## 2019-11-04 DIAGNOSIS — I2510 Atherosclerotic heart disease of native coronary artery without angina pectoris: Secondary | ICD-10-CM

## 2019-11-04 DIAGNOSIS — I519 Heart disease, unspecified: Secondary | ICD-10-CM

## 2019-11-04 DIAGNOSIS — E7800 Pure hypercholesterolemia, unspecified: Secondary | ICD-10-CM

## 2019-11-04 DIAGNOSIS — Z79899 Other long term (current) drug therapy: Secondary | ICD-10-CM

## 2019-11-04 DIAGNOSIS — I43 Cardiomyopathy in diseases classified elsewhere: Secondary | ICD-10-CM

## 2019-11-04 DIAGNOSIS — I10 Essential (primary) hypertension: Secondary | ICD-10-CM

## 2019-11-04 DIAGNOSIS — I5022 Chronic systolic (congestive) heart failure: Secondary | ICD-10-CM

## 2019-11-04 DIAGNOSIS — Z01810 Encounter for preprocedural cardiovascular examination: Secondary | ICD-10-CM

## 2019-11-04 NOTE — Patient Instructions (Addendum)
Medication Instructions:  STOP- Spironolactone STOP- Furosemide(Lasix)  *If you need a refill on your cardiac medications before your next appointment, please call your pharmacy*   Lab Work: BMP Today  If you have labs (blood work) drawn today and your tests are completely normal, you will receive your results only by: Marland Kitchen MyChart Message (if you have MyChart) OR . A paper copy in the mail If you have any lab test that is abnormal or we need to change your treatment, we will call you to review the results.   Testing/Procedures: Your physician has requested that you have an echocardiogram. Echocardiography is a painless test that uses sound waves to create images of your heart. It provides your doctor with information about the size and shape of your heart and how well your heart's chambers and valves are working. This procedure takes approximately one hour. There are no restrictions for this procedure.  Follow-Up: At [facility] you and your health needs are our priority.  As part of our continuing mission to provide you with exceptional heart care, we have created designated Provider Care Teams.  These Care Teams include your primary Cardiologist (physician) and Advanced Practice Providers (APPs -  Physician Assistants and Nurse Practitioners) who all work together to provide you with the care you need, when you need it.  We recommend signing up for the patient portal called "MyChart".  Sign up information is provided on this After Visit Summary.  MyChart is used to connect with patients for Virtual Visits (Telemedicine).  Patients are able to view lab/test results, encounter notes, upcoming appointments, etc.  Non-urgent messages can be sent to your provider as well.   To learn more about what you can do with MyChart, go to [email or web address].    Your next appointment:   10 day(s)  The format for your next appointment:   In Person  Provider:   You may see Sherren Mocha, MD  or one of the following Advanced Practice Providers on your designated Care Team:    Richardson Dopp, PA-C  Vin [place], [place]

## 2019-11-05 ENCOUNTER — Telehealth: Payer: Self-pay | Admitting: *Deleted

## 2019-11-05 LAB — BASIC METABOLIC PANEL
BUN/Creatinine Ratio: 11 (ref 9–23)
BUN: 18 mg/dL (ref 6–24)
CO2: 26 mmol/L (ref 20–29)
Calcium: 9.8 mg/dL (ref 8.7–10.2)
Chloride: 97 mmol/L (ref 96–106)
Creatinine, Ser: 1.59 mg/dL — ABNORMAL HIGH (ref 0.57–1.00)
GFR calc Af Amer: 42 mL/min/{1.73_m2} — ABNORMAL LOW (ref >59)
GFR calc non Af Amer: 36 mL/min/{1.73_m2} — ABNORMAL LOW (ref >59)
Glucose: 410 mg/dL — ABNORMAL HIGH (ref 65–99)
Potassium: 5.3 mmol/L — ABNORMAL HIGH (ref 3.5–5.2)
Sodium: 137 mmol/L (ref 134–144)

## 2019-11-05 NOTE — Telephone Encounter (Signed)
Spoke with pateint regarding 1 month follow up appointment  Requested by Jory Sims, DNP with a member of Dr. Antionette Char team at [address]-----scheduled 12/10/19 at 12:15 pm.  Patient voiced her understanding.

## 2019-11-05 NOTE — Telephone Encounter (Signed)
Patient is not cleared for surgery

## 2019-11-10 ENCOUNTER — Other Ambulatory Visit: Payer: Self-pay

## 2019-11-10 ENCOUNTER — Ambulatory Visit (HOSPITAL_COMMUNITY)
Admission: RE | Admit: 2019-11-10 | Discharge: 2019-11-10 | Disposition: A | Discharge status: Home / Self Care | Payer: Self-pay | Source: Ambulatory Visit | Attending: Adult Health | Admitting: Adult Health

## 2019-11-10 DIAGNOSIS — Z87891 Personal history of nicotine dependence: Secondary | ICD-10-CM | POA: Insufficient documentation

## 2019-11-10 DIAGNOSIS — I2542 Coronary artery dissection: Secondary | ICD-10-CM | POA: Insufficient documentation

## 2019-11-10 DIAGNOSIS — I255 Ischemic cardiomyopathy: Secondary | ICD-10-CM | POA: Insufficient documentation

## 2019-11-10 DIAGNOSIS — I2510 Atherosclerotic heart disease of native coronary artery without angina pectoris: Secondary | ICD-10-CM | POA: Insufficient documentation

## 2019-11-10 DIAGNOSIS — Z95810 Presence of automatic (implantable) cardiac defibrillator: Secondary | ICD-10-CM | POA: Insufficient documentation

## 2019-11-10 DIAGNOSIS — I519 Heart disease, unspecified: Secondary | ICD-10-CM

## 2019-11-10 DIAGNOSIS — I110 Hypertensive heart disease with heart failure: Secondary | ICD-10-CM | POA: Insufficient documentation

## 2019-11-10 DIAGNOSIS — I509 Heart failure, unspecified: Secondary | ICD-10-CM | POA: Insufficient documentation

## 2019-11-10 MED ORDER — PERFLUTREN LIPID MICROSPHERE
1.0000 mL | INTRAVENOUS | Status: AC | PRN
  Administered 2019-11-10: 2 mL via INTRAVENOUS
  Filled 2019-11-10: qty 10

## 2019-11-10 NOTE — Progress Notes (Signed)
  Echocardiogram 2D Echocardiogram with defintiy has been performed.  Emily Mays M 11/10/2019, 1:47 PM

## 2019-11-11 ENCOUNTER — Ambulatory Visit (HOSPITAL_COMMUNITY): Admission: RE | Admit: 2019-11-11 | Payer: Self-pay | Source: Home / Self Care | Admitting: Orthopedic Surgery

## 2019-11-11 ENCOUNTER — Encounter (HOSPITAL_COMMUNITY): Admission: RE | Payer: Self-pay | Source: Home / Self Care

## 2019-11-11 ENCOUNTER — Other Ambulatory Visit (HOSPITAL_COMMUNITY)
Admission: RE | Admit: 2019-11-11 | Discharge: 2019-11-11 | Disposition: A | Discharge status: Home / Self Care | Payer: Self-pay | Source: Ambulatory Visit | Attending: Orthopedic Surgery | Admitting: Orthopedic Surgery

## 2019-11-11 DIAGNOSIS — Z01812 Encounter for preprocedural laboratory examination: Secondary | ICD-10-CM | POA: Insufficient documentation

## 2019-11-11 DIAGNOSIS — Z20822 Contact with and (suspected) exposure to covid-19: Secondary | ICD-10-CM | POA: Insufficient documentation

## 2019-11-11 LAB — SARS CORONAVIRUS 2 (TAT 6-24 HRS): SARS Coronavirus 2: NEGATIVE

## 2019-11-11 SURGERY — OPEN REDUCTION INTERNAL FIXATION (ORIF) ANKLE FRACTURE
Anesthesia: General | Site: Ankle | Laterality: Right

## 2019-11-12 ENCOUNTER — Encounter (HOSPITAL_COMMUNITY): Payer: Self-pay | Admitting: Orthopedic Surgery

## 2019-11-12 ENCOUNTER — Encounter (HOSPITAL_COMMUNITY): Payer: Self-pay | Admitting: Physician Assistant

## 2019-11-12 ENCOUNTER — Telehealth: Payer: Self-pay | Admitting: Cardiovascular Disease

## 2019-11-12 NOTE — Telephone Encounter (Signed)
Pt wanted to know what she needed to do to get her HTN under control so she can have surgery on her ankle. Please call

## 2019-11-12 NOTE — Telephone Encounter (Signed)
Emily Mays, with [facility], left detailed-pt is not cleared for surgery.  Returned call to pt and notified her that she is not cleared for and that I have notified the requesting provider via VM since she was unavailable. Pt states that "the hospital is on the other line I will let them know". She will take and log BP daily and bring with her to upcoming appointments.  Upon further examination of pt's chart per last OV 11-04-2019: She will see [place] in a couple weeks to discuss her response to medication changes and Your next appointment:  10 day(s) I do not see that pt has this appt scheduled.

## 2019-11-12 NOTE — Progress Notes (Signed)
During my LSD phone interview, patient received phone call from [facility] stating that she is not cleared for surgery on 11/13/19 due to hypotension.   Patient denies shortness of breath, fever, cough or chest pain.  PCP - Dr Jannette Fogo Cardiologist - Dr Burt Knack Electrophysiologist - Dr Rayann Heman  Chest x-ray - n/a EKG - 11/04/19 Stress Test - 07/15/09 ECHO - 11/10/19 Cardiac Cath - 07/2008  ICD Pacemaker/Loop- St Jude ICD Model [phone] inserted 04/2009.  Last remote check was on 01/09/19 (normal).  IB message sent for [employer].  Fasting Blood Sugar - unknown Checks Blood Sugar _0  times a day  Do not take farxiga today and do not take glipizide evening dose tonight (Wed).  . Do not take oral diabetes medicines (farxiga, glipizide, actos, janumet) the morning of surgery.  . If your blood sugar is less than 70 mg/dL, you will need to treat for low blood sugar: o Treat a low blood sugar (less than 70 mg/dL) with  cup of clear juice (cranberry or apple), 4 glucose tablets, OR glucose gel. o Recheck blood sugar in 15 minutes after treatment (to make sure it is greater than 70 mg/dL). If your blood sugar is not greater than 70 mg/dL on recheck, call [phone] for further instructions.  Blood Thinner Instructions:  Plavix last dose on 11/06/19 per patient.  Aspirin Instructions: Last dose on 11/10/19 per patient.  ERAS: Clears til 7 am DOS, no drink.  Anesthesia review: Yes  STOP now taking any Aspirin (unless otherwise instructed by your surgeon), Aleve, Naproxen, Ibuprofen, Motrin, Advil, Goody's, BC's, all herbal medications, fish oil, and all vitamins.   Coronavirus Screening Covid test on 11/11/19 was negative.  Patient verbalized understanding of instructions that were given via phone.

## 2019-11-12 NOTE — Telephone Encounter (Signed)
Emily Mays, with [facility] called to find out if patient had further testing done, and has been cleared for her surgery.

## 2019-11-12 NOTE — Telephone Encounter (Signed)
Pt has f/u pre-op appt 12/16/2019  12:15 PM OFFICE VISIT Richardson Dopp, PA-C [facility] OFFICE; 1 month per Jory Sims, DNP

## 2019-11-12 NOTE — Telephone Encounter (Signed)
Hi,   I will need to know what her blood pressures are so that I can make recommendations. She will need to be seen by one of [place] to evaluate her again. Have her take her BP daily and record this and bring to follow up appointment. If one is not scheduled she will need one.   Thank you, KL

## 2019-11-12 NOTE — Progress Notes (Signed)
Anesthesia Chart Review: Same day workup  59 yo female for ORIF right trimalleolar ankle fracture with Dr. Doran Durand on 11/13/19.   Follows with cardiology for ongoing assessment and management of CAD, with history of coronary artery dissection of the LAD, with extensive stenting of the LAD and left circumflex, chronic systolic CHF (echo 0/35/46 shows EF of 30 to 35% with akinesis of the mid-apical anteroseptal wall and apical segment). She developed severe ischemic cardiomyopathy and was treated with ICD for primary prevention of sudden cardiac death, (St Jude, inserted in 05/25/2009.).    Last remote device check 01/09/19 was normal.    She was last seen by cardiology 11/04/2019 for preop eval.  At that visit she was noted to be significantly hypotensive with BP 69/47.  Medication review showed her to be taking 80 mg daily of furosemide rather than 60 mg as prescribed.  She reported being dizzy and lightheaded and has had several falls.  She was not cleared for surgery at that time.  Per note, "Cardiology preop evaluation: Due to significant hypotension.  The patient is not cleared to proceed with repair of her right ankle.  We have notified [facility] to let them know that she will need further testing and medication adjustments before clearing her for surgery."  She subsequently had repeat echo 11/10/2019 and Beckie Busing, NP commented on result stating, "Echocardiogram results are unchanged from prior echo in 2016.  No changes in any other medications. Needs to get BP under control and to check BP as it was too low on last visit."  Poorly controlled DMII. No recent A1c in Epic however recent labs show consistent hyperglycemia. Most recent glucose 410 on 11/04/10. Creatinine also elevated at that time, 1.59.   Pending clearance from cardiology. Surgery to be postponed until she is optimized from their standpoint.   TTE 11/10/19: 1. Left ventricular ejection fraction, by estimation, is  30 to 35%. The  left ventricle has moderately decreased function. The left ventricle  demonstrates regional wall motion abnormalities (see scoring  diagram/findings for description). Left ventricular   diastolic parameters are consistent with Grade I diastolic dysfunction  (impaired relaxation). There is severe akinesis of the left ventricular,  mid-apical anteroseptal wall and apical segment.   2. Right ventricular systolic function is mildly reduced. The right  ventricular size is normal. There is normal pulmonary artery systolic  pressure.   3. The mitral valve is normal in structure. No evidence of mitral valve  regurgitation. No evidence of mitral stenosis.   4. The aortic valve was not well visualized. Aortic valve regurgitation  is not visualized. No aortic stenosis is present.   Wynonia Musty [facility]/Anesthesiology Phone [phone] 11/12/2019 3:11 PM

## 2019-11-12 NOTE — Telephone Encounter (Signed)
Emily Mays reports her BP at one point today was 71/49, although this was after a reading of "140/ something."  She reports she feels fine, she is just tired. She is currently on her way to eat [place] with her friends. Reiterated to her that her pressure is probably falsely low since she feels OK, but to hold her Entresto tonight and in the morning and she will be called for an update. ER precautions reviewed if symptoms occur.

## 2019-11-13 ENCOUNTER — Ambulatory Visit (HOSPITAL_COMMUNITY): Admission: RE | Admit: 2019-11-13 | Payer: Self-pay | Source: Home / Self Care | Admitting: Orthopedic Surgery

## 2019-11-13 HISTORY — DX: Malignant (primary) neoplasm, unspecified: C80.1

## 2019-11-13 HISTORY — DX: Acute myocardial infarction, unspecified: I21.9

## 2019-11-13 HISTORY — DX: Hyperlipidemia, unspecified: E78.5

## 2019-11-13 HISTORY — DX: Pneumonia, unspecified organism: J18.9

## 2019-11-13 HISTORY — DX: Chronic obstructive pulmonary disease, unspecified: J44.9

## 2019-11-13 HISTORY — DX: Type 2 diabetes mellitus without complications: E11.9

## 2019-11-13 SURGERY — OPEN REDUCTION INTERNAL FIXATION (ORIF) ANKLE FRACTURE
Anesthesia: General | Site: Ankle | Laterality: Right

## 2019-11-13 NOTE — Telephone Encounter (Signed)
Discussed patient with pharmD. With minimal symptoms, BP is likely falsely low. Just in case her low BP reading was accurate, she held her [name] last night and will hold until given further instruction. Yesterday, the patient's BP was 98 bpm. PharmD recommends switching from Coreg to Toprol so there is less effect on BP.   To K. Lawrence for review and recs. The patient is scheduled with S. Weaver 8/3. At this time, there are no earlier appointments (except virtual, and she know she needs to come in with her BP machine for calibrating).  The patient understood she would be called today with recommendations.

## 2019-11-13 NOTE — Telephone Encounter (Signed)
The patients BP this morning was 93/57 and 89/59 with HR of 80. She did not take her Entresto last night or this morning.  She feels fine- no lightheadedness or dizziness. Will discuss medications with Dr. Burt Knack and call the patient back with recommendations.

## 2019-11-13 NOTE — Telephone Encounter (Signed)
Checked out schedule and we had an cancellation 11-18-19 @915am .  Called pt and scheduled this appointment for cardiac clearance with Coletta Memos, FNP-C. She will arrive early wearing a mask. This will be at the [facility] office. Pt verbalizes understanding.

## 2019-11-13 NOTE — Telephone Encounter (Signed)
This pt requires urgent surgical treatment of her ankle fracture if there is any hope of getting her back to independent ambulation.  Her surgery is scheduled in the main OR, and she will be an inpatient.  I see that she is not going to follow up for her BP problems for a month.  That time frame will not allow [place] to adequately care for her displaced unstable ankle injury.  Can Dr. Burt Knack please call me on my cell at [phone] so we can figure out how to get this surgery scheduled?  Thanks,  Wylene Simmer, MD

## 2019-11-17 NOTE — Progress Notes (Signed)
Cardiology Clinic Note   Patient Name: Emily Mays Date of Encounter: 11/18/2019  Primary Care Provider:  Bonnita Nasuti, MD Primary Cardiologist:  Sherren Mocha, MD  Patient Profile    Emily Mays 59 year old female presents today for an evaluation of her essential hypertension, coronary artery disease, ischemic cardiomyopathy, chronic combined systolic and diastolic CHF, hyperlipidemia, and preoperative cardiac evaluation.  Past Medical History    Past Medical History:  Diagnosis Date  . AICD (automatic cardioverter/defibrillator) present 04/2009   [facility] model [phone]  . Anemia   . 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 s/p MI 07/24/08 with PCI Endeavor DES to Prox. LAD and BMA to prox. LCX.. relook cath 07/28/08 showing cont. patency of LAD to LCX.  Marland Kitchen [facility])    uterine cancer  . Chronic systolic HF (heart failure) ([place])   . COPD (chronic obstructive pulmonary disease) ([facility])    no inhaler  . DDD (degenerative disc disease)   . Diabetes mellitus without complication ([place])    type 2  . Esophageal reflux   . GERD (gastroesophageal reflux disease)   . HLD (hyperlipidemia)   . Hypokalemia   . Myocardial infarction ([place])   . Other specified forms of chronic ischemic heart disease    echo 07/26/2008 EfF 30% with perapical HK  . Paralytic ileus ([place])   . Pneumonia    x 1  . Unspecified essential hypertension    Past Surgical History:  Procedure Laterality Date  . ABDOMINAL HYSTERECTOMY    . BILATERAL SALPINGOOPHORECTOMY    . CARDIAC CATHETERIZATION  March 2010   2 cardiac stent placement in March 2010.   Marland Kitchen CARDIAC DEFIBRILLATOR PLACEMENT  december 2010   St. Jude  . CHOLECYSTECTOMY     27 years ago  . IMPLANTABLE CARDIOVERTER DEFIBRILLATOR GENERATOR CHANGE N/A 05/16/2013   Generator change (SJM Fortify Assura VR) for prior device malfunction (capacitors unable to charge)  . left pelvic lymphadenectomy      by Dr. Marti Sleigh   . TOTAL ABDOMINAL HYSTERECTOMY W/ BILATERAL SALPINGOOPHORECTOMY    . WISDOM TOOTH EXTRACTION      Allergies  Allergies  Allergen Reactions  . Duloxetine Hcl Other (See Comments)    Made heart race  . Penicillins Hives    History of Present Illness    Ms. Emily Mays has a past medical history of coronary artery disease (LAD dissection, extensive LAD stenting and left circumflex), chronic systolic CHF, echocardiogram 11/10/2019 showed LVEF 30-35%, G1 DD, mildly reduced RV function.  She received an ICD after developing severe ischemic cardiomyopathy.  She continues on Entresto and requires medication assistance program for her medicine.  She was seen in the office by Pricilla Loveless PA-C on 04/23/2019.  At that time she was doing well.  Her digoxin level was 0.6 and her blood glucose was elevated at greater than 200.  She previous saw Jory Sims, DNP on 11/04/2019.  During that time she was being evaluated for ORIF of her right ankle which was scheduled for 11/11/2019.  This was scheduled with Dr. Wylene Simmer at [facility] but was postponed due to hypotension.  She had been taken off her Xanax and pain medication in efforts to increase her blood pressure.  She stated that she had bouts of dizziness/lightheadedness.  She also stated that she had suffered falls.  She presented in a wheelchair.  It was believed that her hypotension was multifactorial.  Her Lasix was held at  that time and she indicated that she had been taking extra Lasix.  Her BMP showed a creatinine of 1.59 and a BUN of 18.  Her potassium at that time was also 5.3.  Her echocardiogram was repeated and showed a slight reduction in her ejection fraction from prior study.  She presents to the clinic today for follow-up evaluation and states she is doing fairly well.  Her right ankle is giving her less pain.  However, she has not been able to bring her blood pressure up.  She is 60/42 today.  I have  reviewed her medications and instructed her to stop taking Percocet, Zanaflex, we will reduce her carvedilol.  I will have her follow-up in 1 week and increase her p.o. hydration.  I will forward my note to [facility] and let them know that she is not cleared for her ORIF.  Today she denies chest pain, shortness of breath, lower extremity edema, fatigue, palpitations, melena, hematuria, hemoptysis, diaphoresis, weakness, presyncope, syncope, orthopnea, and PND.     Home Medications    Prior to Admission medications   Medication Sig Start Date End Date Taking? Authorizing Provider  aspirin EC 81 MG tablet Take 81 mg by mouth daily.    [provider]  atorvastatin (LIPITOR) 40 MG tablet Take 1 tablet (40 mg total) by mouth daily. 04/28/19 04/22/20  Bhagat, Crista Luria, PA  Black Cohosh 40 MG CAPS Take 40 mg by mouth 2 (two) times daily.    [provider]  carvedilol (COREG) 25 MG tablet Take 1 tablet (25 mg total) by mouth 2 (two) times daily with a meal. 04/28/19 04/22/20  Bhagat, Bhavinkumar, PA  clindamycin (CLEOCIN) 300 MG capsule Take 300 mg by mouth 3 (three) times daily. X 10 days    [provider]  clopidogrel (PLAVIX) 75 MG tablet Take 1 tablet (75 mg total) by mouth at bedtime. 12/14/14   Burtis Junes, NP  DIGOX 125 MCG tablet Take 1 tablet (0.125 mg total) by mouth daily. 04/28/19 04/22/20  Bhagat, Bhavinkumar, PA  FARXIGA 10 MG TABS tablet Take 10 mg by mouth daily. 11/27/14   [provider]  glipiZIDE (GLUCOTROL XL) 10 MG 24 hr tablet Take 1 tablet by mouth 2 (two) times daily.  08/06/15   [provider]  hydrOXYzine (ATARAX/VISTARIL) 25 MG tablet Take 25 mg by mouth every 8 (eight) hours as needed for anxiety.  05/09/17   [provider]  nitroGLYCERIN (NITROSTAT) 0.4 MG SL tablet Place 1 tablet (0.4 mg total) under the tongue every 5 (five) minutes as needed for chest pain (up to 3 doses MAX). 07/24/16   Sherren Mocha, MD    ondansetron (ZOFRAN) 4 MG tablet Take 4 mg by mouth 2 (two) times daily as needed for nausea or vomiting.    [provider]  oxyCODONE-acetaminophen (PERCOCET) 10-325 MG per tablet Take 1 tablet by mouth every 6 (six) hours as needed for pain.  11/14/14   [provider]  pantoprazole (PROTONIX) 40 MG tablet Take 40 mg by mouth daily.     [provider]  pioglitazone (ACTOS) 30 MG tablet Take 30 mg by mouth daily.    [provider]  pregabalin (LYRICA) 75 MG capsule Take 75 mg by mouth 2 (two) times daily.    [provider]  sacubitril-valsartan (ENTRESTO) 24-26 MG Take 1 tablet by mouth 2 (two) times daily.    [provider]  sitaGLIPtan-metformin (JANUMET) 50-1000 MG per tablet Take 1 tablet  by mouth 2 (two) times daily with a meal.     [provider]  Vitamin D, Ergocalciferol, (DRISDOL) 50000 units CAPS capsule Take 50,000 Units by mouth 2 (two) times a week.  06/05/17   [provider]    Family History    Family History  Problem Relation Age of Onset  . Cancer Father        Bladder cancer  . Other Father 74       Borderline Diabetic  . CAD Mother        CAD with previous angioplasty.  . Kidney cancer Paternal Aunt   . Breast cancer Paternal Aunt   . Throat cancer Paternal Uncle    She indicated that her mother is alive. She indicated that her father is alive. She indicated that her maternal grandmother is deceased. She indicated that her maternal grandfather is deceased. She indicated that her paternal grandmother is deceased. She indicated that her paternal grandfather is deceased. She indicated that the status of her paternal uncle is unknown.  Social History    Social History   Socioeconomic History  . Marital status: Single    Spouse name: Not on file  . Number of children: Not on file  . Years of education: Not on file  . Highest education level: Not on file  Occupational History  .  Occupation: Glass blower/designer in Cabin crew: [employer]  Tobacco Use  . Smoking status: Former Smoker    Packs/day: 0.50  . Smokeless tobacco: Never Used  . Tobacco comment: used to smoke 1/2 ppd but has not smoked since discharge.   Vaping Use  . Vaping Use: Never used  Substance and Sexual Activity  . Alcohol use: Yes    Comment: Occasional   . Drug use: No  . Sexual activity: Never  Other Topics Concern  . 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 Lives in [place] with a roommate.    Social Determinants of Health   Financial Resource Strain:   . Difficulty of Paying Living Expenses:   Food Insecurity:   . Worried About Charity fundraiser in the Last Year:   . Arboriculturist in the Last Year:   Transportation Needs:   . Film/video editor (Medical):   Marland Kitchen Lack of Transportation (Non-Medical):   Physical Activity:   . Days of Exercise per Week:   . Minutes of Exercise per Session:   Stress:   . Feeling of Stress :   Social Connections:   . Frequency of Communication with Friends and Family:   . Frequency of Social Gatherings with Friends and Family:   . Attends Religious Services:   . Active Member of Clubs or Organizations:   . Attends Archivist Meetings:   Marland Kitchen Marital Status:   Intimate Partner Violence:   . Fear of Current or Ex-Partner:   . Emotionally Abused:   Marland Kitchen Physically Abused:   . Sexually Abused:      Review of Systems    General:  No chills, fever, night sweats or weight changes.  Cardiovascular:  No chest pain, dyspnea on exertion, edema, orthopnea, palpitations, paroxysmal nocturnal dyspnea. Dermatological: No rash, lesions/masses Respiratory: No cough, dyspnea Urologic: No hematuria, dysuria Abdominal:   No nausea, vomiting, diarrhea, bright red blood per rectum, melena, or hematemesis Neurologic:  No visual changes, wkns, changes in mental status. All other systems  reviewed and are otherwise negative except as noted above.  Physical Exam    VS:  BP (!) 60/42 (BP Location: Right Arm, Patient Position: Sitting, Cuff Size: Large)   Pulse 86   Ht 5\' 4"  (1.626 m)   Wt 190 lb (86.2 kg)   SpO2 96%   BMI 32.61 kg/m  , BMI Body mass index is 32.61 kg/m. GEN: Well nourished, well developed, in no acute distress. HEENT: normal. Neck: Supple, no JVD, carotid bruits, or masses. Cardiac: RRR, no murmurs, rubs, or gallops. No clubbing, cyanosis, edema.  Radials/DP/PT 2+ and equal bilaterally.  Respiratory:  Respirations regular and unlabored, clear to auscultation bilaterally. GI: Soft, nontender, nondistended, BS + x 4. MS: no deformity or atrophy. Skin: warm and dry, no rash. Neuro:  Strength and sensation are intact. Psych: Normal affect.  Accessory Clinical Findings    ECG personally reviewed by me today-none today.  EKG 11/04/2019 Normal sinus rhythm 89 bpm  Echocardiogram 11/10/2019 IMPRESSIONS    1. Left ventricular ejection fraction, by estimation, is 30 to 35%. The  left ventricle has moderately decreased function. The left ventricle  demonstrates regional wall motion abnormalities (see scoring  diagram/findings for description). Left ventricular  diastolic parameters are consistent with Grade I diastolic dysfunction  (impaired relaxation). There is severe akinesis of the left ventricular,  mid-apical anteroseptal wall and apical segment.  2. Right ventricular systolic function is mildly reduced. The right  ventricular size is normal. There is normal pulmonary artery systolic  pressure.  3. The mitral valve is normal in structure. No evidence of mitral valve  regurgitation. No evidence of mitral stenosis.  4. The aortic valve was not well visualized. Aortic valve regurgitation  is not visualized. No aortic stenosis is present.   Assessment & Plan   1.  Hypotension-BP today 60/42.  Previously Lasix had been held and her Xanax and  pain medication were also discontinued.  She indicated that her previous visit that she was having episodes of lightheadedness/dizziness.  Her BMP showed an elevation in her creatinine/BUN. Decrease carvedilol to 12.5 mg twice daily Stop Percocet Stop Zanaflex Take over-the-counter medication for pain Increase p.o. hydration Heart healthy low-sodium diet-salty 6 given Increase physical activity as tolerated Repeat BMP  Coronary artery disease-no chest pain today.  History of LAD dissection with extensive LAD stenting and left circumflex 2016. Continue clopidogrel, aspirin, carvedilol Heart healthy low-sodium diet-salty 6 given Increase physical activity as tolerated  Chronic systolic CHF-euvolemic today.  Repeat echocardiogram showed similar results to previous study. Continue Entresto, carvedilol. Heart healthy low-sodium diet-salty 6 given Increase physical activity as tolerated  ICD in situ-Saint Jude device inserted 12/10. Followed by EP, remote interrogations.  Preoperative cardiac evaluation-ORIF right ankle [facility] with Dr. Doran Durand  Chart reviewed as part of pre-operative protocol coverage. Given continued hypotension she is not cleared from a cardiovascular standpoint for surgery.  I will forward a message to Dr. Doran Durand.   Disposition: Follow-up with APP in 1 week.   Jossie Ng. Aziya Arena NP-C    11/18/2019, 9:40 AM [address] Office [phone] Fax [phone]

## 2019-11-18 ENCOUNTER — Other Ambulatory Visit: Payer: Self-pay

## 2019-11-18 ENCOUNTER — Ambulatory Visit (INDEPENDENT_AMBULATORY_CARE_PROVIDER_SITE_OTHER): Payer: Self-pay | Admitting: General Practice

## 2019-11-18 ENCOUNTER — Encounter: Payer: Self-pay | Admitting: General Practice

## 2019-11-18 VITALS — BP 60/42 | HR 86 | Ht 64.0 in | Wt 190.0 lb

## 2019-11-18 DIAGNOSIS — I5022 Chronic systolic (congestive) heart failure: Secondary | ICD-10-CM

## 2019-11-18 DIAGNOSIS — Z95810 Presence of automatic (implantable) cardiac defibrillator: Secondary | ICD-10-CM

## 2019-11-18 DIAGNOSIS — I10 Essential (primary) hypertension: Secondary | ICD-10-CM

## 2019-11-18 DIAGNOSIS — Z01810 Encounter for preprocedural cardiovascular examination: Secondary | ICD-10-CM

## 2019-11-18 DIAGNOSIS — I2510 Atherosclerotic heart disease of native coronary artery without angina pectoris: Secondary | ICD-10-CM

## 2019-11-18 NOTE — Patient Instructions (Signed)
Medication Instructions:  STOP PAIN MEDICATION-ONLY USE OTC PAIN RELIEF  DECREASE CARVEDILOL IN 1/2 12.5MG  TWICE DAILY   *If you need a refill on your cardiac medications before your next appointment, please call your pharmacy*  Special Instructions  NOT CLEARED FOR SURGERY-WE WILL CONTACT SURGEON   Follow-Up: Your next appointment:  12-02-2019 @ 215PM    In Person with JESSE CLEAVER,FNP-C  At [facility], you and your health needs are our priority.  As part of our continuing mission to provide you with exceptional heart care, we have created designated Provider Care Teams.  These Care Teams include your primary Cardiologist (physician) and Advanced Practice Providers (APPs -  Physician Assistants and Nurse Practitioners) who all work together to provide you with the care you need, when you need it.  We recommend signing up for the patient portal called "MyChart".  Sign up information is provided on this After Visit Summary.  MyChart is used to connect with patients for Virtual Visits (Telemedicine).  Patients are able to view lab/test results, encounter notes, upcoming appointments, etc.  Non-urgent messages can be sent to your provider as well.   To learn more about what you can do with MyChart, go to [email or web address].

## 2019-12-01 NOTE — Progress Notes (Signed)
Cardiology Clinic Note   Patient Name: Emily Mays Date of Encounter: 12/02/2019  Primary Care Provider:  Bonnita Nasuti, MD Primary Cardiologist:  Sherren Mocha, MD  Patient Profile    Emily Mays 59 year old female presents today for an evaluation of her essential hypertension, coronary artery disease, ischemic cardiomyopathy, chronic combined systolic and diastolic CHF, hyperlipidemia, and repeat preoperative cardiac evaluation.  Past Medical History    Past Medical History:  Diagnosis Date   AICD (automatic cardioverter/defibrillator) present 04/2009   St Jude model [phone]   Anemia    CAD (coronary artery disease)    spontaneous LAD dissection presenting with anterior STEMI. s/p MI 07/24/08 with PCI Endeavor DES to Prox. LAD and BMA to prox. LCX.. relook cath 07/28/08 showing cont. patency of LAD to LCX.   Cancer [facility])    uterine cancer   Chronic systolic HF (heart failure) (HCC)    COPD (chronic obstructive pulmonary disease) (HCC)    no inhaler   DDD (degenerative disc disease)    Diabetes mellitus without complication (HCC)    type 2   Esophageal reflux    GERD (gastroesophageal reflux disease)    HLD (hyperlipidemia)    Hypokalemia    Myocardial infarction [facility])    Other specified forms of chronic ischemic heart disease    echo 07/26/2008 EfF 30% with perapical HK   Paralytic ileus ([place])    Pneumonia    x 1   Unspecified essential hypertension    Past Surgical History:  Procedure Laterality Date   ABDOMINAL HYSTERECTOMY     BILATERAL SALPINGOOPHORECTOMY     CARDIAC CATHETERIZATION  March 2010   2 cardiac stent placement in March 2010.    CARDIAC DEFIBRILLATOR PLACEMENT  december 2010   St. Jude   CHOLECYSTECTOMY     27 years ago   IMPLANTABLE CARDIOVERTER DEFIBRILLATOR GENERATOR CHANGE N/A 05/16/2013   Generator change (SJM Fortify Assura VR) for prior device malfunction (capacitors unable to charge)   left pelvic  lymphadenectomy     by Dr. Marti Sleigh    TOTAL ABDOMINAL HYSTERECTOMY W/ BILATERAL SALPINGOOPHORECTOMY     WISDOM TOOTH EXTRACTION      Allergies  Allergies  Allergen Reactions   Duloxetine Hcl Other (See Comments)    Made heart race   Penicillins Hives    History of Present Illness    [name] has a past medical history of coronary artery disease (LAD dissection, extensive LAD stenting and left circumflex), chronic systolic CHF, echocardiogram 11/10/2019 showed LVEF 30-35%, G1 DD, mildly reduced RV function.  She received an ICD after developing severe ischemic cardiomyopathy.  She continues on Entresto and requires medication assistance program for her medicine.  She was seen in the office by Pricilla Loveless PA-C on 04/23/2019.  At that time she was doing well.  Her digoxin level was 0.6 and her blood glucose was elevated at greater than 200.  She previous saw Jory Sims, DNP on 11/04/2019.  During that time she was being evaluated for ORIF of her right ankle which was scheduled for 11/11/2019.  This was scheduled with Dr. Wylene Simmer at [facility] but was postponed due to hypotension.  She had been taken off her Xanax and pain medication in efforts to increase her blood pressure.  She stated that she had bouts of dizziness/lightheadedness.  She also stated that she had suffered falls.  She presented in a wheelchair.  It was believed that her hypotension was multifactorial.  Her Lasix was held  at that time and she indicated that she had been taking extra Lasix.  Her BMP showed a creatinine of 1.59 and a BUN of 18.  Her potassium at that time was also 5.3.  Her echocardiogram was repeated and showed a slight reduction in her ejection fraction from prior study.  She presented to the clinic 11/18/2019 for follow-up evaluation and stated she was doing fairly well.  Her right ankle was giving her less pain.  However, she had not been able to bring her blood pressure up.  Her  BP was 60/42 .  I reviewed her medications and instructed her to stop taking Percocet, Zanaflex, reduced her carvedilol.  I planned her follow-up for 1 week and had her increase her p.o. hydration.  I  forwarded my [facility] to [facility] and let them know that she was not cleared for her ORIF.  She presents to the clinic today for follow-up evaluation and states she is having less right ankle pain. She has now been in her cast for over 1 month. She is now wondering whether she will need to pursue surgery or not. Her main concern is being able to care for her parents and care for her daughter who is disabled. She states that if it is just a matter of losing some flexibility in her right ankle she does not wish to pursue surgery. I have encouraged her to continue to take over-the-counter pain medication if needed and avoid medications that would lower her blood pressure like Percocet and Zanaflex. Her blood pressures at home have been routinely 100s over 60s. I will have her follow-up with Dr. Burt Knack in 3 months.  Today she denies chest pain, shortness of breath, lower extremity edema, fatigue, palpitations, melena, hematuria, hemoptysis, diaphoresis, weakness, presyncope, syncope, orthopnea, and PND.  Home Medications    Prior to Admission medications   Medication Sig Start Date End Date Taking? Authorizing Provider  aspirin EC 81 MG tablet Take 81 mg by mouth daily.    [provider]  atorvastatin (LIPITOR) 40 MG tablet Take 1 tablet (40 mg total) by mouth daily. 04/28/19 04/22/20  Bhagat, Crista Luria, PA  Black Cohosh 40 MG CAPS Take 40 mg by mouth 2 (two) times daily.    [provider]  carvedilol (COREG) 25 MG tablet Take 1 tablet (25 mg total) by mouth 2 (two) times daily with a meal. 04/28/19 04/22/20  Bhagat, Crista Luria, PA  clopidogrel (PLAVIX) 75 MG tablet Take 1 tablet (75 mg total) by mouth at bedtime. 12/14/14   Burtis Junes, NP  DIGOX 125 MCG tablet Take 1 tablet (0.125  mg total) by mouth daily. 04/28/19 04/22/20  Bhagat, Bhavinkumar, PA  FARXIGA 10 MG TABS tablet Take 10 mg by mouth daily. 11/27/14   [provider]  glipiZIDE (GLUCOTROL XL) 10 MG 24 hr tablet Take 1 tablet by mouth 2 (two) times daily.  08/06/15   [provider]  hydrOXYzine (ATARAX/VISTARIL) 25 MG tablet Take 25 mg by mouth every 8 (eight) hours as needed for anxiety.  05/09/17   [provider]  nitroGLYCERIN (NITROSTAT) 0.4 MG SL tablet Place 1 tablet (0.4 mg total) under the tongue every 5 (five) minutes as needed for chest pain (up to 3 doses MAX). 07/24/16   Sherren Mocha, MD  ondansetron (ZOFRAN) 4 MG tablet Take 4 mg by mouth 2 (two) times daily as needed for nausea or vomiting.    [provider]  oxyCODONE-acetaminophen (PERCOCET) 10-325 MG per tablet Take 1  tablet by mouth every 6 (six) hours as needed for pain.  11/14/14   [provider]  pantoprazole (PROTONIX) 40 MG tablet Take 40 mg by mouth daily.     [provider]  pioglitazone (ACTOS) 30 MG tablet Take 30 mg by mouth daily.    [provider]  pregabalin (LYRICA) 75 MG capsule Take 75 mg by mouth 2 (two) times daily.    [provider]  sacubitril-valsartan (ENTRESTO) 24-26 MG Take 1 tablet by mouth 2 (two) times daily.    [provider]  sitaGLIPtan-metformin (JANUMET) 50-1000 MG per tablet Take 1 tablet by mouth 2 (two) times daily with a meal.     [provider]  Vitamin D, Ergocalciferol, (DRISDOL) 50000 units CAPS capsule Take 50,000 Units by mouth 2 (two) times a week.  06/05/17   [provider]    Family History    Family History  Problem Relation Age of Onset   Cancer Father        Bladder cancer   Other Father 73       Borderline Diabetic   CAD Mother        CAD with previous angioplasty.   Kidney cancer Paternal Aunt    Breast cancer Paternal Aunt    Throat cancer Paternal Uncle    She indicated that  her mother is alive. She indicated that her father is alive. She indicated that her maternal grandmother is deceased. She indicated that her maternal grandfather is deceased. She indicated that her paternal grandmother is deceased. She indicated that her paternal grandfather is deceased. She indicated that the status of her paternal uncle is unknown.  Social History    Social History   Socioeconomic History   Marital status: Single    Spouse name: Not on file   Number of children: Not on file   Years of education: Not on file   Highest education level: Not on file  Occupational History   Occupation: Glass blower/designer in Cabin crew: [employer]  Tobacco Use   Smoking status: Former Smoker    Packs/day: 0.50   Smokeless tobacco: Never Used   Tobacco comment: used to smoke 1/2 ppd but has not smoked since discharge.   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 Lives in [place] with a roommate.    Social Determinants of Health   Financial Resource Strain:    Difficulty of Paying Living Expenses:   Food Insecurity:    Worried About Charity fundraiser in the Last Year:    Arboriculturist in the Last Year:   Transportation Needs:    Film/video editor (Medical):    Lack of Transportation (Non-Medical):   Physical Activity:    Days of Exercise per Week:    Minutes of Exercise per Session:   Stress:    Feeling of Stress :   Social Connections:    Frequency of Communication with Friends and Family:    Frequency of Social Gatherings with Friends and Family:    Attends Religious Services:    Active Member of Clubs or Organizations:    Attends Archivist Meetings:    Marital  Status:   Intimate Partner  Violence:    Fear of Current or Ex-Partner:    Emotionally Abused:    Physically Abused:    Sexually Abused:      Review of Systems    General:  No chills, fever, night sweats or weight changes.  Cardiovascular:  No chest pain, dyspnea on exertion, edema, orthopnea, palpitations, paroxysmal nocturnal dyspnea. Dermatological: No rash, lesions/masses Respiratory: No cough, dyspnea Urologic: No hematuria, dysuria Abdominal:   No nausea, vomiting, diarrhea, bright red blood per rectum, melena, or hematemesis Neurologic:  No visual changes, wkns, changes in mental status. All other systems reviewed and are otherwise negative except as noted above.  Physical Exam    VS:  BP (!) 88/50    Pulse 78    Ht 5\' 4"  (1.626 m)    Wt 191 lb 9 oz (86.9 kg)    SpO2 97%    BMI 32.88 kg/m  , BMI Body mass index is 32.88 kg/m. GEN: Well nourished, well developed, in no acute distress. HEENT: normal. Neck: Supple, no JVD, carotid bruits, or masses. Cardiac: RRR, no murmurs, rubs, or gallops. No clubbing, cyanosis, edema.  Radials/DP/PT 2+ and equal bilaterally.  Respiratory:  Respirations regular and unlabored, clear to auscultation bilaterally. GI: Soft, nontender, nondistended, BS + x 4. MS: no deformity or atrophy. Skin: warm and dry, no rash. Neuro:  Strength and sensation are intact. Psych: Normal affect.  Accessory Clinical Findings    Recent Labs: 10/29/2019: Hemoglobin 11.7; Platelets 193 11/04/2019: BUN 18; Creatinine, Ser 1.59; Potassium 5.3; Sodium 137   Recent Lipid Panel    Component Value Date/Time   CHOL 117 04/04/2010 0935   TRIG 151.0 (H) 04/04/2010 0935   HDL 31.70 (L) 04/04/2010 0935   CHOLHDL 4 04/04/2010 0935   VLDL 30.2 04/04/2010 0935   LDLCALC 55 04/04/2010 0935   LDLDIRECT 71.4 09/17/2009 1044    ECG personally reviewed by me today-none today.  EKG 11/04/2019 Normal sinus rhythm 89 bpm  Echocardiogram 11/10/2019 IMPRESSIONS    1. Left ventricular  ejection fraction, by estimation, is 30 to 35%. The  left ventricle has moderately decreased function. The left ventricle  demonstrates regional wall motion abnormalities (see scoring  diagram/findings for description). Left ventricular  diastolic parameters are consistent with Grade I diastolic dysfunction  (impaired relaxation). There is severe akinesis of the left ventricular,  mid-apical anteroseptal wall and apical segment.  2. Right ventricular systolic function is mildly reduced. The right  ventricular size is normal. There is normal pulmonary artery systolic  pressure.  3. The mitral valve is normal in structure. No evidence of mitral valve  regurgitation. No evidence of mitral stenosis.  4. The aortic valve was not well visualized. Aortic valve regurgitation  is not visualized. No aortic stenosis is present.   Assessment & Plan   1.   Hypotension-BP today 88/50. Blood pressures at home since last visit have been 100s over 60s. Previously Lasix had been held and her Xanax and pain medication were also discontinued.   Having much fewer episodes of lightheadedness/dizziness and are now lasting only seconds with standing. Continue carvedilol to 12.5 mg twice daily Continue to hold Percocet Continue to hold Zanaflex Take over-the-counter medication for pain Increase p.o. hydration Heart healthy low-sodium diet-salty 6 given Increase physical activity as tolerated   Coronary artery disease-no chest pain .  History of LAD dissection with extensive LAD stenting and left circumflex 2016. Continue clopidogrel, aspirin, carvedilol Heart healthy low-sodium diet-salty 6 given  Increase physical activity as tolerated  Chronic systolic CHF-euvolemic today.  Repeat echocardiogram showed similar results to previous study. Continue Entresto, carvedilol. Heart healthy low-sodium diet-salty 6 given Increase physical activity as tolerated  ICD in situ-Saint Jude device inserted  12/10. Followed by EP, remote interrogations.  Preoperative cardiac evaluation-ORIF right ankle [facility] with Dr. Doran Durand      Primary Cardiologist: Sherren Mocha, MD  Chart reviewed as part of pre-operative protocol coverage. Given past medical history and time since last visit, based on ACC/AHA guidelines, AVRIANNA SMART would be at acceptable risk for the planned procedure without further cardiovascular testing.   She has a RCRI class III risk, 6.6% risk of major cardiac event.  I will route this recommendation to the requesting party via Epic fax function and remove from pre-op pool.  Please call with questions.    Disposition: Follow-up with Dr. Burt Knack in 3 month.   Jossie Ng. Alyanah Elliott NP-C    12/02/2019, 2:08 PM [facility] [address] Office [phone] Fax [phone]

## 2019-12-02 ENCOUNTER — Encounter: Payer: Self-pay | Admitting: General Practice

## 2019-12-02 ENCOUNTER — Other Ambulatory Visit: Payer: Self-pay

## 2019-12-02 ENCOUNTER — Ambulatory Visit (INDEPENDENT_AMBULATORY_CARE_PROVIDER_SITE_OTHER): Payer: Self-pay | Admitting: General Practice

## 2019-12-02 VITALS — BP 88/50 | HR 78 | Ht 64.0 in | Wt 191.6 lb

## 2019-12-02 DIAGNOSIS — Z95810 Presence of automatic (implantable) cardiac defibrillator: Secondary | ICD-10-CM

## 2019-12-02 DIAGNOSIS — I5042 Chronic combined systolic (congestive) and diastolic (congestive) heart failure: Secondary | ICD-10-CM

## 2019-12-02 DIAGNOSIS — I2510 Atherosclerotic heart disease of native coronary artery without angina pectoris: Secondary | ICD-10-CM

## 2019-12-02 DIAGNOSIS — Z01810 Encounter for preprocedural cardiovascular examination: Secondary | ICD-10-CM

## 2019-12-02 DIAGNOSIS — I9589 Other hypotension: Secondary | ICD-10-CM

## 2019-12-02 NOTE — Patient Instructions (Signed)
Medication Instructions:  ONLY OVER-THE-COUNTER PAIN MEDICATION *If you need a refill on your cardiac medications before your next appointment, please call your pharmacy*  Special Instructions CLEARED FOR RIGHT ANKLE SURGERY  PLEASE FOLLOW LOW SODIUM DIET  MAINTAIN YOUR HYDRATION  Follow-Up: Your next appointment:  3 month(s) In Person with Sherren Mocha, MD  At [facility], you and your health needs are our priority.  As part of our continuing mission to provide you with exceptional heart care, we have created designated Provider Care Teams.  These Care Teams include your primary Cardiologist (physician) and Advanced Practice Providers (APPs -  Physician Assistants and Nurse Practitioners) who all work together to provide you with the care you need, when you need it.  We recommend signing up for the patient portal called "MyChart".  Sign up information is provided on this After Visit Summary.  MyChart is used to connect with patients for Virtual Visits (Telemedicine).  Patients are able to view lab/test results, encounter notes, upcoming appointments, etc.  Non-urgent messages can be sent to your provider as well.   To learn more about what you can do with MyChart, go to [email or web address].

## 2019-12-03 NOTE — Progress Notes (Signed)
Cardiology Office Note Date:  12/04/2019  Patient ID:  Emily Mays, Emily Mays 05/12/1961, MRN [id number] PCP:  Bonnita Nasuti, MD  Cardiologist:  Dr. Burt Knack EP: Dr. Rayann Heman    Chief Complaint: over due device visit  History of Present Illness: Emily Mays is a 59 y.o. female with history of CAD (spontaneous LAD dissection presenting with anterior STEMI. s/p MI 07/24/08 with PCI Endeavor DES to Prox. LAD and BMA to prox. LCX.. relook cath 07/28/08 showing cont. patency of LAD to LCX), ICM, chronic CHF (systolic), ICD, COPD, HTN, HLD, DM, GERD.  She comes today to be seen for Dr. Rayann Heman, last seen by him July 2019, was doing well, no changes were made, planned to change to cell service for her remotes and recommended annual APP visits  More recently she has seen cardiology service in preparation for ankle surgery.  She had been found to be hypotensive/orthostatic, her medicines adjusted and in subsequent visits better.  She last saw J. Cleaver, NP 12/02/19 feeling better, BPs reported at home 100's/60's with some orthostatic symptoms.  Her BP at her visit 88/50 Urged to hydrate better, her reduced coreg dose and entresto continued, instructed to avoid her pain meds.   TODAY She is doing well outside of her ankle.  She denies any kind of CP, palpitations or cardiac awareness, no rest SOB, denies DIE at her current level of exertion (in a cast without weight bearing).  Ambulated with a walker and her knee resting on seat.  She denies any unusual weakness, no near syncope or syncope.  She denies orthostatic symptoms (though had been before her medicines have been reduced).  None since.   Device information SJM single chamber ICD implanted 05/11/2009, gen change 2015  (gen change done 2/2 capacitors could not change at that the device was malfunctioning) at the time of her gen change report notes  SJM 7122-65 Durata lead was thoroughly evaluated with Cine in the AP position (5 minutes  required).  There was no evidence for wire extrusion from the lead.  The lead was satisfactory and stable   Past Medical History:  Diagnosis Date  . AICD (automatic cardioverter/defibrillator) present 04/2009   [facility] model [phone]  . Anemia   . 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 s/p MI 07/24/08 with PCI Endeavor DES to Prox. LAD and BMA to prox. LCX.. relook cath 07/28/08 showing cont. patency of LAD to LCX.  Marland Kitchen Cancer [facility])    uterine cancer  . Chronic systolic HF (heart failure) ([place])   . COPD (chronic obstructive pulmonary disease) (HCC)    no inhaler  . DDD (degenerative disc disease)   . Diabetes mellitus without complication ([place])    type 2  . Esophageal reflux   . GERD (gastroesophageal reflux disease)   . HLD (hyperlipidemia)   . Hypokalemia   . Myocardial infarction ([place])   . Other specified forms of chronic ischemic heart disease    echo 07/26/2008 EfF 30% with perapical HK  . Paralytic ileus ([place])   . Pneumonia    x 1  . Unspecified essential hypertension     Past Surgical History:  Procedure Laterality Date  . ABDOMINAL HYSTERECTOMY    . BILATERAL SALPINGOOPHORECTOMY    . CARDIAC CATHETERIZATION  March 2010   2 cardiac stent placement in March 2010.   Marland Kitchen CARDIAC DEFIBRILLATOR PLACEMENT  december 2010   St. Jude  . CHOLECYSTECTOMY  27 years ago  . IMPLANTABLE CARDIOVERTER DEFIBRILLATOR GENERATOR CHANGE N/A 05/16/2013   Generator change (SJM Fortify Assura VR) for prior device malfunction (capacitors unable to charge)  . left pelvic lymphadenectomy     by Dr. Marti Sleigh   . TOTAL ABDOMINAL HYSTERECTOMY W/ BILATERAL SALPINGOOPHORECTOMY    . WISDOM TOOTH EXTRACTION      Current Outpatient Medications  Medication Sig Dispense Refill  . atorvastatin (LIPITOR) 40 MG tablet Take 1 tablet (40 mg total) by mouth daily. 90 tablet 3  . Black Cohosh 40 MG CAPS Take 40 mg by mouth 2 (two) times daily.     . carvedilol (COREG) 25 MG tablet Take 1 tablet (25 mg total) by mouth 2 (two) times daily with a meal. 180 tablet 3  . DIGOX 125 MCG tablet Take 1 tablet (0.125 mg total) by mouth daily. 90 tablet 3  . FARXIGA 10 MG TABS tablet Take 10 mg by mouth daily.    Marland Kitchen glipiZIDE (GLUCOTROL XL) 10 MG 24 hr tablet Take 1 tablet by mouth 2 (two) times daily.     . hydrOXYzine (ATARAX/VISTARIL) 25 MG tablet Take 25 mg by mouth every 8 (eight) hours as needed for anxiety.     . nitroGLYCERIN (NITROSTAT) 0.4 MG SL tablet Place 1 tablet (0.4 mg total) under the tongue every 5 (five) minutes as needed for chest pain (up to 3 doses MAX). 25 tablet 2  . ondansetron (ZOFRAN) 4 MG tablet Take 4 mg by mouth 2 (two) times daily as needed for nausea or vomiting.    Marland Kitchen oxyCODONE-acetaminophen (PERCOCET) 10-325 MG per tablet Take 1 tablet by mouth every 6 (six) hours as needed for pain.     . pantoprazole (PROTONIX) 40 MG tablet Take 40 mg by mouth daily.     . pioglitazone (ACTOS) 30 MG tablet Take 30 mg by mouth daily.    . pregabalin (LYRICA) 75 MG capsule Take 75 mg by mouth 2 (two) times daily.    . sitaGLIPtan-metformin (JANUMET) 50-1000 MG per tablet Take 1 tablet by mouth 2 (two) times daily with a meal.     . tiZANidine (ZANAFLEX) 4 MG tablet Take 4 mg by mouth every 6 (six) hours as needed for muscle spasms.    . Vitamin D, Ergocalciferol, (DRISDOL) 50000 units CAPS capsule Take 50,000 Units by mouth 2 (two) times a week.      No current facility-administered medications for this visit.    Allergies:   Duloxetine hcl and Penicillins   Social History:  The patient  reports that she has quit smoking. She smoked 0.50 packs per day. She has never used smokeless tobacco. She reports current alcohol use. She reports that she does not use drugs.   Family History:  The patient's family history includes Breast cancer in her paternal aunt; CAD in her mother; Cancer in her father; Kidney cancer in her paternal aunt;  Other (age of onset: 53) in her father; Throat cancer in her paternal uncle.  ROS:  Please see the history of present illness.    All other systems are reviewed and otherwise negative.   PHYSICAL EXAM:  VS:  BP (!) 88/58   Pulse 74   Ht 5\' 4"  (1.626 m)   Wt 190 lb (86.2 kg)   SpO2 95%   BMI 32.61 kg/m  BMI: Body mass index is 32.61 kg/m. Well nourished, well developed, in no acute distress  HEENT: normocephalic, atraumatic  Neck: no JVD, carotid bruits or masses  Cardiac:  RRR; no significant murmurs, no rubs, or gallops Lungs:  CTA b/l, no wheezing, rhonchi or rales  Abd: soft, nontender MS: no deformity or atrophy Ext: RLE in a hard cast, no edema left Skin: warm and dry, no rash Neuro:  No gross deficits appreciated Psych: euthymic mood, full affect   ICD site is stable, no tethering or discomfort   EKG:  Not done today  ICD interrogation done today and reviewed by myself: Battery and lead measurements are good She had magnet response 12/19/202 (denioes any known exposure) One NSVT that appears more likely an SVT with no clear change in morphology   11/10/2019: TTE IMPRESSIONS  1. Left ventricular ejection fraction, by estimation, is 30 to 35%. The  left ventricle has moderately decreased function. The left ventricle  demonstrates regional wall motion abnormalities (see scoring  diagram/findings for description). Left ventricular  diastolic parameters are consistent with Grade I diastolic dysfunction  (impaired relaxation). There is severe akinesis of the left ventricular,  mid-apical anteroseptal wall and apical segment.  2. Right ventricular systolic function is mildly reduced. The right  ventricular size is normal. There is normal pulmonary artery systolic  pressure.  3. The mitral valve is normal in structure. No evidence of mitral valve  regurgitation. No evidence of mitral stenosis.  4. The aortic valve was not well visualized. Aortic valve regurgitation   is not visualized. No aortic stenosis is present.    Recent Labs: 10/29/2019: Hemoglobin 11.7; Platelets 193 11/04/2019: BUN 18; Creatinine, Ser 1.59; Potassium 5.3; Sodium 137  No results found for requested labs within last 8760 hours.   CrCl cannot be calculated (Patient's most recent lab result is older than the maximum 21 days allowed.).   Wt Readings from Last 3 Encounters:  12/04/19 190 lb (86.2 kg)  12/02/19 191 lb 9 oz (86.9 kg)  11/18/19 190 lb (86.2 kg)     Other studies reviewed: Additional studies/records reviewed today include: summarized above  ASSESSMENT AND PLAN:  1. ICD     Intact function     She is instructed to plug in her transmitter, she had the cell adapter but did not know it went with her current transmitter.     She was instructed to call the device clinic once she had it plugged in, and/or if she needed help or troubleshooting.    She will continue with her cardiology team for the following, (seen 2 days ago) 2. CAD 3. ICM 4, chronic CHF (systolic)     hypotension of late  Her CorVue wobbles above/below threshold No symptoms or exam findings to suggest volume OL currently  BP remains low She reports at home (an digital arm cuff) before her AM medicines 96/47, 108/69, 106/67  She reports not taking Entresto for at at least a week, probably 2 weeks, she reports told at the last or one of her last visits She has been on 1/2 tab coreg as instructed (12.5mg  BID) She does continue to take her zanaflex and uses percocet when needed though not regularly  Made Dr. Burt Knack aware, he had been in the loop from her prior visits.  Given no symptoms, no changes to her medicines.   Disposition: F/u with Dr. Burt Knack and team as directed by them, remotes Q 46mo, and in clinic in 1 year, sooner if needed  Current medicines are reviewed at length with the patient today.  The patient did not have any concerns regarding medicines.  Signed, Tommye Standard,  PA-C 12/04/2019  10:53 AM     [facility] [address] [place] [address] [phone] (office)  [phone] (fax)

## 2019-12-04 ENCOUNTER — Ambulatory Visit (INDEPENDENT_AMBULATORY_CARE_PROVIDER_SITE_OTHER): Payer: Self-pay | Admitting: Physician Assistant

## 2019-12-04 ENCOUNTER — Encounter: Payer: Self-pay | Admitting: Physician Assistant

## 2019-12-04 ENCOUNTER — Other Ambulatory Visit: Payer: Self-pay

## 2019-12-04 VITALS — BP 88/58 | HR 74 | Ht 64.0 in | Wt 190.0 lb

## 2019-12-04 DIAGNOSIS — Z95810 Presence of automatic (implantable) cardiac defibrillator: Secondary | ICD-10-CM

## 2019-12-04 DIAGNOSIS — I5022 Chronic systolic (congestive) heart failure: Secondary | ICD-10-CM

## 2019-12-04 DIAGNOSIS — I255 Ischemic cardiomyopathy: Secondary | ICD-10-CM

## 2019-12-04 DIAGNOSIS — I5042 Chronic combined systolic (congestive) and diastolic (congestive) heart failure: Secondary | ICD-10-CM

## 2019-12-04 MED ORDER — CARVEDILOL 25 MG PO TABS: 12.5000 mg | ORAL_TABLET | Freq: Two times a day (BID) | ORAL | 3 refills | Status: DC

## 2019-12-04 NOTE — Patient Instructions (Signed)

## 2019-12-10 ENCOUNTER — Ambulatory Visit: Payer: Self-pay | Admitting: Physician Assistant

## 2019-12-15 NOTE — Progress Notes (Deleted)
Cardiology Office Note:    Date:  12/15/2019   ID:  Emily Mays, DOB 1960/10/24, MRN [id number]  PCP:  Bonnita Nasuti, MD  Cardiologist:  Sherren Mocha, MD *** Electrophysiologist:  None   Referring MD: Bonnita Nasuti, MD   Chief Complaint:  No chief complaint on file.    Patient Profile:    Emily Mays is a 59 y.o. female with:   Coronary artery disease   S/p SCAD in 2010 >> extensive stenting of LAD and LCx   Systolic CHF  Ischemic CM  cMRi in 2010: EF 35  Echocardiogram in 8/16: EF 35-40  S/p ICD   Hypertension   Hyperlipidemia   Diabetes mellitus   Prior CV studies: Echocardiogram 11/10/19 EF 30-35, Gr 1 DD, ant-sept and apical AK, mild reduced RVSF  Echo 12/22/14 EF 35-40, anteroseptal and apical AK, grade 1 diastolic dysfunction  Myoview 3/11 Anterior, anterolateral, anteroseptal, inferior, inferolateral, apical scar, no ischemia, EF 40  cMRI 02/2009 Impression 1) Moderate LVE with anterior, apical and septal akineis. EF35% 2) Full thickness scar with thinning of the mid and distal anterior wall, septum and apex. 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  Her medications have been adjusted.  She was initially seen for surgical clearance.  But, this was put on hold due to hypotension.  She returns for follow up.  The DICTATELATER SmartLink is not supported in this context. ***   Past Medical History:  Diagnosis Date  . AICD (automatic cardioverter/defibrillator) present 04/2009   [facility] model [phone]  . Anemia   . 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 s/p MI 07/24/08 with PCI Endeavor DES to Prox. LAD and BMA to prox. LCX..  relook cath 07/28/08 showing cont. patency of LAD to LCX.  Marland Kitchen Cancer [facility])    uterine cancer  . Chronic systolic HF (heart failure) ([place])   . COPD (chronic obstructive pulmonary disease) (HCC)    no inhaler  . DDD (degenerative disc disease)   . Diabetes mellitus without complication ([place])    type 2  . Esophageal reflux   . GERD (gastroesophageal reflux disease)   . HLD (hyperlipidemia)   . Hypokalemia   . Myocardial infarction ([place])   . Other specified forms of chronic ischemic heart disease    echo 07/26/2008 EfF 30% with perapical HK  . Paralytic ileus ([place])   . Pneumonia    x 1  . Unspecified essential hypertension     Current Medications: No outpatient medications have been marked as taking for the 12/16/19 encounter (Appointment) with Richardson Dopp T, PA-C.     Allergies:   Duloxetine hcl and Penicillins   Social History   Tobacco Use  . Smoking status: Former Smoker    Packs/day: 0.50  . Smokeless tobacco: Never Used  . Tobacco comment: used to smoke 1/2 ppd but has not smoked since discharge.   Vaping Use  . Vaping Use: Never used  Substance Use Topics  . Alcohol use: Yes    Comment: Occasional   . Drug use: No     Family Hx: The patient's family history includes Breast cancer in her paternal aunt;  CAD in her mother; Cancer in her father; Kidney cancer in her paternal aunt; Other (age of onset: 37) in her father; Throat cancer in her paternal uncle.  ROS   EKGs/Labs/Other Test Reviewed:    EKG:  EKG is *** ordered today.  The ekg ordered today demonstrates ***  Recent Labs: 10/29/2019: Hemoglobin 11.7; Platelets 193 11/04/2019: BUN 18; Creatinine, Ser 1.59; Potassium 5.3; Sodium 137   Recent Lipid Panel Lab Results  Component Value Date/Time   CHOL 117 04/04/2010 09:35 AM   TRIG 151.0 (H) 04/04/2010 09:35 AM   HDL 31.70 (L) 04/04/2010 09:35 AM   CHOLHDL 4 04/04/2010 09:35 AM   LDLCALC 55 04/04/2010 09:35 AM   LDLDIRECT 71.4 09/17/2009 10:44 AM     Physical Exam:    VS:  There were no vitals taken for this visit.    Wt Readings from Last 3 Encounters:  12/04/19 190 lb (86.2 kg)  12/02/19 191 lb 9 oz (86.9 kg)  11/18/19 190 lb (86.2 kg)     Physical Exam ***  ASSESSMENT & PLAN:    *** 1. Chronic combined systolic and diastolic CHF (congestive heart failure) ([place]) -  Secondary to ischemic cardiomyopathy. EF 35-40. Overall volume stable on current therapy which includes carvedilol, Entresto, spironolactone, digoxin. She is NYHA 2. Continue current regimen.   2. Coronary artery disease involving native coronary artery of native heart without angina pectoris -  Status post spontaneous coronary artery dissection 2010 treated with stenting of the LAD and LCx. She denies angina. Continue aspirin, clopidogrel, beta blocker.  3. Essential hypertension The patient's blood pressure is controlled on her current regimen.  Continue current therapy.   4. Implantable cardioverter-defibrillator (ICD) in situ She is followed in the [facility] clinic. I have asked her to go ahead and make a transmission. Follow-up with EP as planned.  Dispo:  No follow-ups on file.   Medication Adjustments/Labs and Tests Ordered: Current medicines are reviewed at length with the patient today.  Concerns regarding medicines are outlined above.  Tests Ordered: No orders of the defined types were placed in this encounter.  Medication Changes: No orders of the defined types were placed in this encounter.   Signed, Richardson Dopp, PA-C  12/15/2019 11:18 PM    [facility] [place], [place], [place]  [address] Phone: [phone]; Fax: [phone]

## 2019-12-16 ENCOUNTER — Ambulatory Visit: Payer: Self-pay | Admitting: Physician Assistant

## 2020-01-07 ENCOUNTER — Ambulatory Visit (INDEPENDENT_AMBULATORY_CARE_PROVIDER_SITE_OTHER): Payer: Self-pay | Admitting: *Deleted

## 2020-01-07 DIAGNOSIS — I255 Ischemic cardiomyopathy: Secondary | ICD-10-CM

## 2020-01-09 LAB — CUP PACEART REMOTE DEVICE CHECK
Battery Remaining Longevity: 50 mo
Battery Remaining Percentage: 46 %
Battery Voltage: 2.93 V
Brady Statistic RV Percent Paced: 1 %
Date Time Interrogation Session: 20210826164927
HighPow Impedance: 73 Ohm
HighPow Impedance: 73 Ohm
Implantable Lead Implant Date: 20101228
Implantable Lead Location: 753860
Implantable Lead Model: 7122
Implantable Pulse Generator Implant Date: 20150102
Lead Channel Impedance Value: 830 Ohm
Lead Channel Pacing Threshold Amplitude: 1 V
Lead Channel Pacing Threshold Pulse Width: 0.5 ms
Lead Channel Sensing Intrinsic Amplitude: 12 mV
Lead Channel Setting Pacing Amplitude: 2.5 V
Lead Channel Setting Pacing Pulse Width: 0.5 ms
Lead Channel Setting Sensing Sensitivity: 0.5 mV
Pulse Gen Serial Number: 7152745

## 2020-01-12 NOTE — Progress Notes (Signed)
Cardiology Office Note:    Date:  01/13/2020   ID:  Emily Mays, DOB 25-Nov-1960, MRN [id number]  PCP:  Bonnita Nasuti, MD  [facility] Cardiologist:  Sherren Mocha, MD   [facility] Electrophysiologist:  Thompson Grayer, MD   Referring MD: Bonnita Nasuti, MD   Chief Complaint:  Follow-up (CHF)    Patient Profile:    Emily Mays is a 59 y.o. female with:   Coronary artery disease   S/p SCAD (LAD) in 2010 >> s/p extensive stenting of LAD and LCx  Systolic CHF  Ischemic CM  cMRI 02/2019: EF 35  Echocardiogram 8/16: EF 35-40  S/p ICD   Hypertension   GERD  Prior CV studies:   Echocardiogram 11/10/19 EF 30-35, Gr 1 DD, ant-sept and apical AK, mild reduced RVSF  Echo 12/22/14 EF 35-40, anteroseptal and apical AK, grade 1 diastolic dysfunction  Myoview 3/11 Anterior, anterolateral, anteroseptal, inferior, inferolateral, apical scar, no ischemia, EF 40  cMRI 02/2009 Impression 1) Moderate LVE with anterior, apical and septal akineis. EF35% 2) Full thickness scar with thinning of the mid and distal anterior wall, septum and apex. 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  She was hypotensive and she had medication adjustments.  She was seen back several times in clinic by Coletta Memos, NP with improved BP readings.  She was ultimately cleared for ankle surgery.  She returns for follow up.  She is here alone.  She never had ankle surgery due to hypotension and uncontrolled blood sugars.  Her PCP recently placed her on a new medication for diabetes.  The name is not known.  She developed significant nausea and vomiting and has stopped the medication.  She continues to feel somewhat  nauseated.  She has not had syncope.  She has been lightheaded at times.  She has not had chest pain, shortness of breath, orthopnea or leg swelling.      Past Medical History:  Diagnosis Date   AICD (automatic cardioverter/defibrillator) present 04/2009   [employer] model [phone]   Anemia    CAD (coronary artery disease)    spontaneous LAD dissection presenting with anterior STEMI. s/p MI 07/24/08 with PCI Endeavor DES to Prox. LAD and BMA to prox. LCX.. relook cath 07/28/08 showing cont. patency of LAD to LCX.   Cancer [facility])    uterine cancer   Chronic systolic HF (heart failure) (HCC)    COPD (chronic obstructive pulmonary disease) (HCC)    no inhaler   DDD (degenerative disc disease)    Diabetes mellitus without complication (HCC)    type 2   Esophageal reflux    GERD (gastroesophageal reflux disease)    HLD (hyperlipidemia)    Hypokalemia    Myocardial infarction [facility])    Other specified forms of chronic ischemic heart disease    echo 07/26/2008 EfF 30% with perapical HK   Paralytic ileus (HCC)    Pneumonia    x 1   Unspecified essential hypertension     Current Medications: Current Meds  Medication Sig   atorvastatin (LIPITOR) 40 MG tablet Take 1 tablet (40 mg total) by mouth daily.   Black Cohosh 40 MG CAPS Take 40 mg by  mouth 2 (two) times daily.   DIGOX 125 MCG tablet Take 1 tablet (0.125 mg total) by mouth daily.   FARXIGA 10 MG TABS tablet Take 10 mg by mouth daily.   glipiZIDE (GLUCOTROL XL) 10 MG 24 hr tablet Take 1 tablet by mouth 2 (two) times daily.    hydrOXYzine (ATARAX/VISTARIL) 25 MG tablet Take 25 mg by mouth every 8 (eight) hours as needed for anxiety.    Insulin Pen Needle 32G X 6 MM MISC 1 Dose by Does not apply route once a week.   METFORMIN HCL PO Take 1 tablet by mouth in the morning and at bedtime. Patient not sure of tablet dose   nitroGLYCERIN (NITROSTAT) 0.4 MG SL tablet Place 1 tablet (0.4 mg total) under the tongue every 5  (five) minutes as needed for chest pain (up to 3 doses MAX).   oxyCODONE-acetaminophen (PERCOCET) 10-325 MG per tablet Take 1 tablet by mouth every 6 (six) hours as needed for pain.    pantoprazole (PROTONIX) 40 MG tablet Take 40 mg by mouth daily.    pioglitazone (ACTOS) 30 MG tablet Take 30 mg by mouth daily.   pregabalin (LYRICA) 75 MG capsule Take 75 mg by mouth 2 (two) times daily.   tiZANidine (ZANAFLEX) 4 MG tablet Take 4 mg by mouth every 6 (six) hours as needed for muscle spasms.   Vitamin D, Ergocalciferol, (DRISDOL) 50000 units CAPS capsule Take 50,000 Units by mouth 2 (two) times a week.    [DISCONTINUED] carvedilol (COREG) 25 MG tablet Take 0.5 tablets (12.5 mg total) by mouth 2 (two) times daily with a meal.     Allergies:   Duloxetine hcl and Penicillins   Social History   Tobacco Use   Smoking status: Former Smoker    Packs/day: 0.50   Smokeless tobacco: Never Used   Tobacco comment: used to smoke 1/2 ppd but has not smoked since discharge.   Vaping Use   Vaping Use: Never used  Substance Use Topics   Alcohol use: Yes    Comment: Occasional    Drug use: No     Family Hx: The patient's family history includes Breast cancer in her paternal aunt; CAD in her mother; Cancer in her father; Kidney cancer in her paternal aunt; Other (age of onset: 21) in her father; Throat cancer in her paternal uncle.  ROS  See HPI  EKGs/Labs/Other Test Reviewed:    EKG:  EKG is not ordered today.  The ekg ordered today demonstrates n/a  Recent Labs: 10/29/2019: Hemoglobin 11.7; Platelets 193 11/04/2019: BUN 18; Creatinine, Ser 1.59; Potassium 5.3; Sodium 137   Recent Lipid Panel Lab Results  Component Value Date/Time   CHOL 117 04/04/2010 09:35 AM   TRIG 151.0 (H) 04/04/2010 09:35 AM   HDL 31.70 (L) 04/04/2010 09:35 AM   CHOLHDL 4 04/04/2010 09:35 AM   LDLCALC 55 04/04/2010 09:35 AM   LDLDIRECT 71.4 09/17/2009 10:44 AM    Physical Exam:    VS:  BP 92/60     Pulse 87    Ht 5\' 4"  (1.626 m)    Wt 175 lb (79.4 kg)    SpO2 96%    BMI 30.04 kg/m     Wt Readings from Last 3 Encounters:  01/13/20 175 lb (79.4 kg)  12/04/19 190 lb (86.2 kg)  12/02/19 191 lb 9 oz (86.9 kg)     Constitutional:      Appearance: Healthy appearance. Not in distress.  Neck:  Vascular: No JVR.  Pulmonary:     Effort: Pulmonary effort is normal.     Breath sounds: No wheezing. No rales.  Cardiovascular:     Normal rate. Regular rhythm. Normal S1. Normal S2.     Murmurs: There is no murmur.  Edema:    Peripheral edema absent.  Abdominal:     Palpations: Abdomen is soft.  Musculoskeletal:     Comments: Removable cast lower L leg Skin:    General: Skin is warm and dry.  Neurological:     Mental Status: Alert and oriented to person, place and time.     Cranial Nerves: Cranial nerves are intact.       ASSESSMENT & PLAN:    1. Chronic systolic congestive heart failure (HCC) EF 30-35 by echocardiogram June 2021.  Ischemic cardiomyopathy.  NYHA II.  Volume status appears stable.  Blood pressure has limited CHF therapy.  Her Entresto and spironolactone had to be discontinued.  She is now only taking furosemide 20 mg daily.  Her carvedilol dose has also been reduced.  Obtain follow-up BMET, digoxin level today.  Decrease carvedilol to 6.25 mg twice daily.  Follow-up in 3 months.  Once her blood pressure improves, we could consider adding low-dose ARB back to her medical regimen.  2. Coronary artery disease involving native coronary artery of native heart without angina pectoris Hx of SCAD in 2010 treated with extensive stenting of the LAD and LCx.  No anginal symptoms.  Continue ASA, beta-blocker, statin.   3. ICD (implantable cardioverter-defibrillator) in place FU with EP as planned.     Dispo:  Return in about 3 months (around 04/13/2020) for Routine Follow Up, w/ Dr. Burt Knack, or Richardson Dopp, PA-C, in person.   Medication Adjustments/Labs and Tests  Ordered: Current medicines are reviewed at length with the patient today.  Concerns regarding medicines are outlined above.  Tests Ordered: Orders Placed This Encounter  Procedures   Basic metabolic panel   Digoxin level   Medication Changes: Meds ordered this encounter  Medications   furosemide (LASIX) 20 MG tablet    Sig: Take 1 tablet (20 mg total) by mouth daily.    Dispense:  90 tablet    Refill:  3   carvedilol (COREG) 6.25 MG tablet    Sig: Take 1 tablet (6.25 mg total) by mouth 2 (two) times daily.    Dispense:  180 tablet    Refill:  3    Signed, Richardson Dopp, PA-C  01/13/2020 10:09 AM    [facility] [address], [place], [place]  [address] Phone: [phone]; Fax: [phone]

## 2020-01-13 ENCOUNTER — Ambulatory Visit (INDEPENDENT_AMBULATORY_CARE_PROVIDER_SITE_OTHER): Payer: Self-pay | Admitting: Physician Assistant

## 2020-01-13 ENCOUNTER — Encounter: Payer: Self-pay | Admitting: Physician Assistant

## 2020-01-13 ENCOUNTER — Telehealth: Payer: Self-pay

## 2020-01-13 ENCOUNTER — Other Ambulatory Visit: Payer: Self-pay

## 2020-01-13 VITALS — BP 92/60 | HR 87 | Ht 64.0 in | Wt 175.0 lb

## 2020-01-13 DIAGNOSIS — E876 Hypokalemia: Secondary | ICD-10-CM

## 2020-01-13 DIAGNOSIS — Z95810 Presence of automatic (implantable) cardiac defibrillator: Secondary | ICD-10-CM

## 2020-01-13 DIAGNOSIS — I2510 Atherosclerotic heart disease of native coronary artery without angina pectoris: Secondary | ICD-10-CM

## 2020-01-13 DIAGNOSIS — I5022 Chronic systolic (congestive) heart failure: Secondary | ICD-10-CM

## 2020-01-13 MED ORDER — FUROSEMIDE 20 MG PO TABS: 20.0000 mg | ORAL_TABLET | Freq: Every day | ORAL | 3 refills | Status: DC

## 2020-01-13 MED ORDER — CARVEDILOL 6.25 MG PO TABS: 6.2500 mg | ORAL_TABLET | Freq: Two times a day (BID) | ORAL | 3 refills | Status: DC

## 2020-01-13 MED ORDER — POTASSIUM CHLORIDE CRYS ER 20 MEQ PO TBCR: EXTENDED_RELEASE_TABLET | ORAL | 3 refills | Status: DC

## 2020-01-13 NOTE — Progress Notes (Signed)
Remote ICD transmission.   

## 2020-01-13 NOTE — Telephone Encounter (Signed)
-----   Message from Liliane Shi, [place] sent at 01/13/2020  5:04 PM EDT ----- Creatinine stable.  Potassium significantly low. Low potassium likely related to recent nausea and vomiting in the setting of diuretic use. Digoxin level still pending. PLAN: 1.  Start Potassium - take 40 mEq x 2 today and repeat tomorrow 2.  Then, take K+ 40 mEq once daily  3.  BMET tomorrow 4.  Repeat BMET 1 week Richardson Dopp, PA-C 01/13/2020 5:02 PM

## 2020-01-13 NOTE — Patient Instructions (Signed)
Medication Instructions:  Your physician has recommended you make the following change in your medication:   1) Decrease Carvedilol to 6.25 mg, 1 tablet by mouth twice a day  *If you need a refill on your cardiac medications before your next appointment, please call your pharmacy*  Lab Work: You will have labs drawn today: BMET/Digoxin level  Testing/Procedures: None ordered today  Follow-Up: Keep follow up on 03/29/20 at 10:00AM with Sherren Mocha, MD  Other Instructions If nausea does not subside in 24 hours, call your PCP

## 2020-01-13 NOTE — Telephone Encounter (Signed)
The patient has been notified of the result and verbalized understanding.  All questions (if any) were answered. Sent potassium in to patient's pharmacy of choice. Patient cannot come in tomorrow for lab work, but can come in on Thursday. Will schedule patient for lab work on 01/15/20 and on 01/21/20. Ewell Poe Garvin, RN 01/13/2020 5:23 PM

## 2020-01-14 LAB — BASIC METABOLIC PANEL
BUN/Creatinine Ratio: 7 — ABNORMAL LOW (ref 9–23)
BUN: 9 mg/dL (ref 6–24)
CO2: 31 mmol/L — ABNORMAL HIGH (ref 20–29)
Calcium: 9.4 mg/dL (ref 8.7–10.2)
Chloride: 85 mmol/L — ABNORMAL LOW (ref 96–106)
Creatinine, Ser: 1.28 mg/dL — ABNORMAL HIGH (ref 0.57–1.00)
GFR calc Af Amer: 54 mL/min/{1.73_m2} — ABNORMAL LOW (ref >59)
GFR calc non Af Amer: 47 mL/min/{1.73_m2} — ABNORMAL LOW (ref >59)
Glucose: 340 mg/dL — ABNORMAL HIGH (ref 65–99)
Potassium: 2.6 mmol/L — CL (ref 3.5–5.2)
Sodium: 137 mmol/L (ref 134–144)

## 2020-01-14 LAB — DIGOXIN LEVEL: Digoxin, Serum: 0.9 ng/mL (ref 0.5–0.9)

## 2020-01-15 ENCOUNTER — Other Ambulatory Visit: Payer: Self-pay

## 2020-01-16 ENCOUNTER — Other Ambulatory Visit: Payer: Self-pay

## 2020-01-16 DIAGNOSIS — Z79899 Other long term (current) drug therapy: Secondary | ICD-10-CM

## 2020-01-21 ENCOUNTER — Other Ambulatory Visit: Payer: Self-pay | Admitting: *Deleted

## 2020-01-21 ENCOUNTER — Other Ambulatory Visit: Payer: Self-pay

## 2020-01-21 DIAGNOSIS — E876 Hypokalemia: Secondary | ICD-10-CM

## 2020-01-21 DIAGNOSIS — Z79899 Other long term (current) drug therapy: Secondary | ICD-10-CM

## 2020-01-22 ENCOUNTER — Telehealth: Payer: Self-pay

## 2020-01-22 DIAGNOSIS — E876 Hypokalemia: Secondary | ICD-10-CM

## 2020-01-22 LAB — BASIC METABOLIC PANEL
BUN/Creatinine Ratio: 7 — ABNORMAL LOW (ref 9–23)
BUN: 7 mg/dL (ref 6–24)
CO2: 24 mmol/L (ref 20–29)
Calcium: 8.9 mg/dL (ref 8.7–10.2)
Chloride: 97 mmol/L (ref 96–106)
Creatinine, Ser: 0.94 mg/dL (ref 0.57–1.00)
GFR calc Af Amer: 78 mL/min/{1.73_m2} (ref >59)
GFR calc non Af Amer: 68 mL/min/{1.73_m2} (ref >59)
Glucose: 418 mg/dL — ABNORMAL HIGH (ref 65–99)
Potassium: 3.1 mmol/L — ABNORMAL LOW (ref 3.5–5.2)
Sodium: 138 mmol/L (ref 134–144)

## 2020-01-22 LAB — DIGOXIN LEVEL: Digoxin, Serum: 0.6 ng/mL (ref 0.5–0.9)

## 2020-01-22 NOTE — Telephone Encounter (Signed)
The patient has been notified of the result and verbalized understanding.  All questions (if any) were answered. Antonieta Iba, RN 01/22/2020 5:11 PM  Patient is scheduled to see PCP next week regarding diabetes. She will increase K to 40 meq twice per day. She will come in next week for BMET.

## 2020-01-22 NOTE — Telephone Encounter (Signed)
-----   Message from Liliane Shi, PA-C sent at 01/22/2020  9:51 AM EDT ----- Please call patient K+ still low. Creatinine normal. Dig level okay.  Glucose is significantly elevated.  PLAN:  1. Send labs to PCP. She needs f/u on diabetes ASAP. 2. If she has short acting insulin to take for elevated sugar, she should follow her protocol. 3. Increase K+ to 40 mEq twice daily  4. BMET 1 week Richardson Dopp, PA-C    01/22/2020 9:50 AM

## 2020-01-27 ENCOUNTER — Other Ambulatory Visit: Payer: Self-pay

## 2020-01-27 ENCOUNTER — Telehealth: Payer: Self-pay

## 2020-01-27 DIAGNOSIS — E876 Hypokalemia: Secondary | ICD-10-CM

## 2020-01-27 LAB — BASIC METABOLIC PANEL
BUN/Creatinine Ratio: 13 (ref 9–23)
BUN: 11 mg/dL (ref 6–24)
CO2: 22 mmol/L (ref 20–29)
Calcium: 9.4 mg/dL (ref 8.7–10.2)
Chloride: 104 mmol/L (ref 96–106)
Creatinine, Ser: 0.88 mg/dL (ref 0.57–1.00)
GFR calc Af Amer: 85 mL/min/{1.73_m2} (ref >59)
GFR calc non Af Amer: 74 mL/min/{1.73_m2} (ref >59)
Glucose: 234 mg/dL — ABNORMAL HIGH (ref 65–99)
Potassium: 4.2 mmol/L (ref 3.5–5.2)
Sodium: 141 mmol/L (ref 134–144)

## 2020-01-27 NOTE — Telephone Encounter (Signed)
Called patient in regards to lab results. Patient reports that she has had some increased SOB recently. Denies any chest pain, swelling, or weight gain. She states that she has trouble catching her breath when walking short distances. She is seeing her PCP on Friday and is going to speak with them in regards to this but patient would also like for Scott to know. Advised that I would forward message to him and we would let her know if there were any further recommendations.

## 2020-01-28 NOTE — Telephone Encounter (Signed)
Attempted to call patient. Unable to leave voicemail.  

## 2020-01-28 NOTE — Telephone Encounter (Signed)
Increase Furosemide to 40 mg once daily x 2 days, then resume 20 mg once daily  Take extra K+ 20 mEq along with 40 mg of Furosemide, then resume usual dose of K+. Arrange follow up appt in next 1 week. Richardson Dopp, PA-C    01/28/2020 8:09 AM

## 2020-01-29 NOTE — Telephone Encounter (Signed)
Spoke with the patient and she will increase Lasix and potassium for the next two days per Richardson Dopp, PA_C.  Patient also states that she has been having a runny nose and coughing up clear phlegm for the past couple of days and is unsure if it is allergies or if she is sick.  Patient states that she is going to see her PCP tomorrow and depending on how that goes she will call back and schedule an appointment if needed.

## 2020-03-29 ENCOUNTER — Other Ambulatory Visit: Payer: Self-pay

## 2020-03-29 ENCOUNTER — Encounter: Payer: Self-pay | Admitting: Cardiovascular Disease

## 2020-03-29 ENCOUNTER — Ambulatory Visit (INDEPENDENT_AMBULATORY_CARE_PROVIDER_SITE_OTHER): Payer: Self-pay | Admitting: Cardiovascular Disease

## 2020-03-29 VITALS — BP 128/88 | HR 104 | Ht 64.0 in | Wt 177.2 lb

## 2020-03-29 DIAGNOSIS — I2510 Atherosclerotic heart disease of native coronary artery without angina pectoris: Secondary | ICD-10-CM

## 2020-03-29 DIAGNOSIS — I5042 Chronic combined systolic (congestive) and diastolic (congestive) heart failure: Secondary | ICD-10-CM

## 2020-03-29 MED ORDER — SACUBITRIL-VALSARTAN 24-26 MG PO TABS: 1.0000 | ORAL_TABLET | Freq: Two times a day (BID) | ORAL | 3 refills | Status: DC

## 2020-03-29 NOTE — Progress Notes (Signed)
Cardiology Office Note:    Date:  03/29/2020   ID:  Emily Mays, DOB 1960-06-26, MRN [id number]  PCP:  Bonnita Nasuti, MD  [facility] Cardiologist:  Sherren Mocha, MD  [facility] Electrophysiologist:  Thompson Grayer, MD   Referring MD: Bonnita Nasuti, MD   Chief Complaint  Patient presents with  . Shortness of Breath    History of Present Illness:    Emily Mays is a 59 y.o. female with a hx of:  Coronary artery disease  ? S/p SCAD (LAD) in 2010 >> s/p extensive stenting of LAD and LCx  Systolic CHF ? Ischemic CM ? cMRI 02/2019: EF 35 ? Echocardiogram 8/16: EF 35-40  S/p ICD   Hypertension   GERD  She is here alone today. She's doing pretty well, but had problems last month. Reports marked shortness of breath and feeling that she was 'suffocating.' She tried some breathing treatments but this didn't help her much. She then took an increased dose of her furosemide and she felt much better within a matter of hours. States that she had a very good response with good diuresis. She didn't call us at the time, but made these adjustments on her own. She is now back to baseline without shortness of breath. The patient is vaccinated for Covid and she took the booster this past week.   The patient was seen this past summer for preoperative evaluation when she injured her ankle.  At the time she was noted to have significant hypotension and her medications were adjusted.  Spironolactone and Entresto were both stopped.  Carvedilol was reduced.  At the time her only symptom was generalized fatigue.  She did not have dizziness or presyncope.  Past Medical History:  Diagnosis Date  . AICD (automatic cardioverter/defibrillator) present 04/2009   [facility] model [phone]  . Anemia   . 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 s/p MI 07/24/08 with PCI Endeavor DES to Prox. LAD and BMA to prox. LCX.. relook cath 07/28/08 showing  cont. patency of LAD to LCX.  Marland Kitchen Cancer [facility])    uterine cancer  . Chronic systolic HF (heart failure) ([place])   . COPD (chronic obstructive pulmonary disease) ([facility])    no inhaler  . DDD (degenerative disc disease)   . Diabetes mellitus without complication ([place])    type 2  . Esophageal reflux   . GERD (gastroesophageal reflux disease)   . HLD (hyperlipidemia)   . Hypokalemia   . Myocardial infarction ([place])   . Other specified forms of chronic ischemic heart disease    echo 07/26/2008 EfF 30% with perapical HK  . Paralytic ileus ([place])   . Pneumonia    x 1  . Unspecified essential hypertension     Past Surgical History:  Procedure Laterality Date  . ABDOMINAL HYSTERECTOMY    . BILATERAL SALPINGOOPHORECTOMY    . CARDIAC CATHETERIZATION  March 2010   2 cardiac stent placement in March 2010.   Marland Kitchen CARDIAC DEFIBRILLATOR PLACEMENT  december 2010   [employer]  . CHOLECYSTECTOMY     27 years ago  . IMPLANTABLE CARDIOVERTER DEFIBRILLATOR GENERATOR CHANGE N/A 05/16/2013   Generator change (SJM Fortify Assura VR) for prior device malfunction (capacitors unable to charge)  . left pelvic lymphadenectomy     by Dr. Marti Sleigh   . TOTAL ABDOMINAL HYSTERECTOMY W/ BILATERAL SALPINGOOPHORECTOMY    . WISDOM TOOTH EXTRACTION  Current Medications: Current Meds  Medication Sig  . atorvastatin (LIPITOR) 40 MG tablet Take 1 tablet (40 mg total) by mouth daily.  . Black Cohosh 40 MG CAPS Take 40 mg by mouth 2 (two) times daily.  . carvedilol (COREG) 6.25 MG tablet Take 1 tablet (6.25 mg total) by mouth 2 (two) times daily.  Marland Kitchen DIGOX 125 MCG tablet Take 1 tablet (0.125 mg total) by mouth daily.  Marland Kitchen FARXIGA 10 MG TABS tablet Take 10 mg by mouth daily.  . furosemide (LASIX) 20 MG tablet Take 1 tablet (20 mg total) by mouth daily.  Marland Kitchen glipiZIDE (GLUCOTROL XL) 10 MG 24 hr tablet Take 1 tablet by mouth 2 (two) times daily.   . hydrOXYzine (ATARAX/VISTARIL) 25 MG tablet Take 25 mg by mouth every  8 (eight) hours as needed for anxiety.   . Insulin Pen Needle 32G X 6 MM MISC 1 Dose by Does not apply route once a week.  . METFORMIN HCL PO Take 1 tablet by mouth in the morning and at bedtime. Patient not sure of tablet dose  . nitroGLYCERIN (NITROSTAT) 0.4 MG SL tablet Place 1 tablet (0.4 mg total) under the tongue every 5 (five) minutes as needed for chest pain (up to 3 doses MAX).  Marland Kitchen oxyCODONE-acetaminophen (PERCOCET) 10-325 MG per tablet Take 1 tablet by mouth every 6 (six) hours as needed for pain.   . pantoprazole (PROTONIX) 40 MG tablet Take 40 mg by mouth daily.   . pioglitazone (ACTOS) 30 MG tablet Take 30 mg by mouth daily.  . potassium chloride SA (KLOR-CON) 20 MEQ tablet Take 40 meq (2 tablets) by mouth twice a day for 2 days, then take 40 meq (2 tablets) by mouth daily.  . pregabalin (LYRICA) 75 MG capsule Take 75 mg by mouth 2 (two) times daily.  Marland Kitchen tiZANidine (ZANAFLEX) 4 MG tablet Take 4 mg by mouth every 6 (six) hours as needed for muscle spasms.  . Vitamin D, Ergocalciferol, (DRISDOL) 50000 units CAPS capsule Take 50,000 Units by mouth 2 (two) times a week.      Allergies:   Duloxetine hcl and Penicillins   Social History   Socioeconomic History  . Marital status: Single    Spouse name: Not on file  . Number of children: Not on file  . Years of education: Not on file  . Highest education level: Not on file  Occupational History  . Occupation: Glass blower/designer in Cabin crew: [employer]  Tobacco Use  . Smoking status: Former Smoker    Packs/day: 0.50  . Smokeless tobacco: Never Used  . Tobacco comment: used to smoke 1/2 ppd but has not smoked since discharge.   Vaping Use  . Vaping Use: Never used  Substance and Sexual Activity  . Alcohol use: Yes    Comment: Occasional   . Drug use: No  . Sexual activity: Never  Other Topics Concern  . 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 Lives in  [place] with a roommate.    Social Determinants of Health   Financial Resource Strain:   . Difficulty of Paying Living Expenses: Not on file  Food Insecurity:   . Worried About Charity fundraiser in the Last Year: Not on file  . Ran Out of Food in the Last Year: Not on file  Transportation Needs:   . Lack of Transportation (Medical): Not on file  . Lack  of Transportation (Non-Medical): Not on file  Physical Activity:   . Days of Exercise per Week: Not on file  . Minutes of Exercise per Session: Not on file  Stress:   . Feeling of Stress : Not on file  Social Connections:   . Frequency of Communication with Friends and Family: Not on file  . Frequency of Social Gatherings with Friends and Family: Not on file  . Attends Religious Services: Not on file  . Active Member of Clubs or Organizations: Not on file  . Attends Archivist Meetings: Not on file  . Marital Status: Not on file     Family History: The patient's family history includes Breast cancer in her paternal aunt; CAD in her mother; Cancer in her father; Kidney cancer in her paternal aunt; Other (age of onset: 39) in her father; Throat cancer in her paternal uncle.  ROS:   Please see the history of present illness.    All other systems reviewed and are negative.  EKGs/Labs/Other Studies Reviewed:    The following studies were reviewed today: Echo 11/10/2019: IMPRESSIONS    1. Left ventricular ejection fraction, by estimation, is 30 to 35%. The  left ventricle has moderately decreased function. The left ventricle  demonstrates regional wall motion abnormalities (see scoring  diagram/findings for description). Left ventricular  diastolic parameters are consistent with Grade I diastolic dysfunction  (impaired relaxation). There is severe akinesis of the left ventricular,  mid-apical anteroseptal wall and apical segment.  2. Right ventricular systolic function is mildly reduced. The right  ventricular  size is normal. There is normal pulmonary artery systolic  pressure.  3. The mitral valve is normal in structure. No evidence of mitral valve  regurgitation. No evidence of mitral stenosis.  4. The aortic valve was not well visualized. Aortic valve regurgitation  is not visualized. No aortic stenosis is present.   EKG:  EKG is not ordered today.    Recent Labs: 10/29/2019: Hemoglobin 11.7; Platelets 193 01/27/2020: BUN 11; Creatinine, Ser 0.88; Potassium 4.2; Sodium 141  Recent Lipid Panel    Component Value Date/Time   CHOL 117 04/04/2010 0935   TRIG 151.0 (H) 04/04/2010 0935   HDL 31.70 (L) 04/04/2010 0935   CHOLHDL 4 04/04/2010 0935   VLDL 30.2 04/04/2010 0935   LDLCALC 55 04/04/2010 0935   LDLDIRECT 71.4 09/17/2009 1044     Risk Assessment/Calculations:       Physical Exam:    VS:  BP 128/88   Pulse (!) 104   Ht 5\' 4"  (1.626 m)   Wt 177 lb 3.2 oz (80.4 kg)   SpO2 97%   BMI 30.42 kg/m     Wt Readings from Last 3 Encounters:  03/29/20 177 lb 3.2 oz (80.4 kg)  01/13/20 175 lb (79.4 kg)  12/04/19 190 lb (86.2 kg)     GEN:  Well nourished, well developed in no acute distress HEENT: Normal NECK: No JVD; No carotid bruits LYMPHATICS: No lymphadenopathy CARDIAC: RRR, no murmurs, rubs, gallops RESPIRATORY:  Clear to auscultation without rales, wheezing or rhonchi  ABDOMEN: Soft, non-tender, non-distended MUSCULOSKELETAL:  No edema; No deformity  SKIN: Warm and dry NEUROLOGIC:  Alert and oriented x 3 PSYCHIATRIC:  Normal affect   ASSESSMENT:    1. Chronic combined systolic and diastolic CHF (congestive heart failure) ([place])   2. Coronary artery disease involving native coronary artery of native heart without angina pectoris    PLAN:    In order of problems listed  above:  1. The patient describes a recent episode of acute systolic heart failure where she took more Lasix and symptoms improved as she diuresed.  She seems to be back to baseline now with [place]  Heart Association functional class II symptoms.  I think her blood pressure can probably tolerate a rechallenge with Entresto.  I advised her to start on a 24/26 mg dose.  She will monitor blood pressure at home.  We will continue carvedilol at current dose.  Continue digoxin.  Continue current dose of furosemide.  If she develops recurrent symptoms of worsening shortness of breath, she understands that she can take up to 40 mg of furosemide daily.  Will arrange a pharmacy visit in about 2 to 3 weeks with a metabolic panel at that time to make sure that she is tolerating reinitiation of Entresto.  I would like her to have APP follow-up in 3 months. 2. Secondary to scad, extensive stenting done in 2010, continues on medical therapy without recurrent angina.  Shared Decision Making/Informed Consent      Medication Adjustments/Labs and Tests Ordered: Current medicines are reviewed at length with the patient today.  Concerns regarding medicines are outlined above.  Orders Placed This Encounter  Procedures  . Basic metabolic panel  . AMB Referral to [facility]   Meds ordered this encounter  Medications  . sacubitril-valsartan (ENTRESTO) 24-26 MG    Sig: Take 1 tablet by mouth 2 (two) times daily.    Dispense:  180 tablet    Refill:  3    Please Honor Card patient is presenting for Carmie Kanner: [id number]; Juanna Cao: [phone]; XIHWT: OHS; UUEK: [phone]    Patient Instructions  Medication Instructions:  1) RESTART ENTRESTO 24-26 mg twice daily *If you need a refill on your cardiac medications before your next appointment, please call your pharmacy*  Lab Work: You will have labs drawn when you return for your HTN Clinic visit. If you have labs (blood work) drawn today and your tests are completely normal, you will receive your results only by: Marland Kitchen MyChart Message (if you have MyChart) OR . A paper copy in the mail If you have any lab test that is abnormal or we need to change your  treatment, we will call you to review the results.  Follow-Up: Your provider recommends that you schedule a follow-up appointment in the HTN CLINIC in 3 weeks.   Your provider recommends that you schedule a follow-up appointment in 3 months with Richardson Dopp, PA.    Signed, Sherren Mocha, MD  03/29/2020 1:23 PM    [facility]

## 2020-03-29 NOTE — Patient Instructions (Signed)
Medication Instructions:  1) RESTART ENTRESTO 24-26 mg twice daily *If you need a refill on your cardiac medications before your next appointment, please call your pharmacy*  Lab Work: You will have labs drawn when you return for your HTN Clinic visit. If you have labs (blood work) drawn today and your tests are completely normal, you will receive your results only by: Marland Kitchen MyChart Message (if you have MyChart) OR . A paper copy in the mail If you have any lab test that is abnormal or we need to change your treatment, we will call you to review the results.  Follow-Up: Your provider recommends that you schedule a follow-up appointment in the HTN CLINIC in 3 weeks.   Your provider recommends that you schedule a follow-up appointment in 3 months with Richardson Dopp, PA.

## 2020-04-19 ENCOUNTER — Ambulatory Visit (INDEPENDENT_AMBULATORY_CARE_PROVIDER_SITE_OTHER): Payer: Self-pay | Admitting: Pharmacist

## 2020-04-19 ENCOUNTER — Ambulatory Visit (INDEPENDENT_AMBULATORY_CARE_PROVIDER_SITE_OTHER): Payer: Self-pay

## 2020-04-19 ENCOUNTER — Other Ambulatory Visit: Payer: Self-pay

## 2020-04-19 VITALS — BP 69/48 | HR 83 | Wt 185.0 lb

## 2020-04-19 DIAGNOSIS — I10 Essential (primary) hypertension: Secondary | ICD-10-CM

## 2020-04-19 DIAGNOSIS — I255 Ischemic cardiomyopathy: Secondary | ICD-10-CM

## 2020-04-19 DIAGNOSIS — I5042 Chronic combined systolic (congestive) and diastolic (congestive) heart failure: Secondary | ICD-10-CM

## 2020-04-19 DIAGNOSIS — E1165 Type 2 diabetes mellitus with hyperglycemia: Secondary | ICD-10-CM | POA: Insufficient documentation

## 2020-04-19 DIAGNOSIS — I2510 Atherosclerotic heart disease of native coronary artery without angina pectoris: Secondary | ICD-10-CM

## 2020-04-19 LAB — CUP PACEART REMOTE DEVICE CHECK
Battery Remaining Longevity: 47 mo
Battery Remaining Percentage: 44 %
Battery Voltage: 2.93 V
Brady Statistic RV Percent Paced: 1 %
Date Time Interrogation Session: 20211205200825
HighPow Impedance: 59 Ohm
HighPow Impedance: 59 Ohm
Implantable Lead Implant Date: 20101228
Implantable Lead Location: 753860
Implantable Lead Model: 7122
Implantable Pulse Generator Implant Date: 20150102
Lead Channel Impedance Value: 690 Ohm
Lead Channel Pacing Threshold Amplitude: 1 V
Lead Channel Pacing Threshold Pulse Width: 0.5 ms
Lead Channel Sensing Intrinsic Amplitude: 11.7 mV
Lead Channel Setting Pacing Amplitude: 2.5 V
Lead Channel Setting Pacing Pulse Width: 0.5 ms
Lead Channel Setting Sensing Sensitivity: 0.5 mV
Pulse Gen Serial Number: 7152745

## 2020-04-19 MED ORDER — NITROGLYCERIN 0.4 MG SL SUBL: 0.4000 mg | SUBLINGUAL_TABLET | SUBLINGUAL | 2 refills | Status: DC | PRN

## 2020-04-19 NOTE — Progress Notes (Signed)
Patient ID: Emily Mays                 DOB: 1961/01/12                      MRN: [id number]     HPI: Emily Mays is a 59 y.o. female referred by Emily Mays to HTN clinic. PMH is significant for CHF, CAD, ICD, Hypertension (also in last year has been hypotensive), and DM.  Has history of MI.   Patient presents today under some stress.  Her daughter needs back surgery and her mother fell down and broke her ribs and is now at skilled nursing facility.    In last year, patient has been having hypotensive issues on Entresto, carvedilol, and furosemide.  Blood pressure increased when Entresto was discontinued.  At last visit with Dr Burt Mays, Emily Mays was reinstated.  Felt chest tightness recently and took a furosemide.  Urinated throughout the day and then felt better.    Is not checking her BP at home or her blood sugar. Was recently started on Ozempic by PCP.  Denies headache, SOB, dizziness.  Walks every day but is sometimes limited by foot pain.    Current HTN meds: carvedilol 6.25 mg BID, digoxin 125 mcg daily, farxiga 10 mg daily, furosemide 20-40 mg daily, Entresto 24-26 mg BID Previously tried:  BP goal: <130/80  Family History: Father - CAD, MI, Mother - valvular disorders.  Social History: quit smoking 11 years ago.  No alcohol  Diet: Drinks at least 2 regular sodas a day  Exercise: walking every day.    Home BP readings: n/a  Wt Readings from Last 3 Encounters:  03/29/20 177 lb 3.2 oz (80.4 kg)  01/13/20 175 lb (79.4 kg)  12/04/19 190 lb (86.2 kg)   BP Readings from Last 3 Encounters:  03/29/20 128/88  01/13/20 92/60  12/04/19 (!) 88/58   Pulse Readings from Last 3 Encounters:  03/29/20 (!) 104  01/13/20 87  12/04/19 74    Renal function: CrCl cannot be calculated (Patient's most recent lab result is older than the maximum 21 days allowed.).  Past Medical History:  Diagnosis Date  . AICD (automatic cardioverter/defibrillator) present 04/2009   [facility]  model [phone]  . Anemia   . 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 s/p MI 07/24/08 with PCI Endeavor DES to Prox. LAD and BMA to prox. LCX.. relook cath 07/28/08 showing cont. patency of LAD to LCX.  Marland Kitchen Cancer [facility])    uterine cancer  . Chronic systolic HF (heart failure) ([place])   . COPD (chronic obstructive pulmonary disease) ([facility])    no inhaler  . DDD (degenerative disc disease)   . Diabetes mellitus without complication ([place])    type 2  . Esophageal reflux   . GERD (gastroesophageal reflux disease)   . HLD (hyperlipidemia)   . Hypokalemia   . Myocardial infarction ([place])   . Other specified forms of chronic ischemic heart disease    echo 07/26/2008 EfF 30% with perapical HK  . Paralytic ileus ([place])   . Pneumonia    x 1  . Unspecified essential hypertension     Current Outpatient Medications on File Prior to Visit  Medication Sig Dispense Refill  . atorvastatin (LIPITOR) 40 MG tablet Take 1 tablet (40 mg total) by mouth daily. 90 tablet 3  . Black Cohosh 40 MG CAPS Take 40 mg by mouth 2 (  two) times daily.    . carvedilol (COREG) 6.25 MG tablet Take 1 tablet (6.25 mg total) by mouth 2 (two) times daily. 180 tablet 3  . DIGOX 125 MCG tablet Take 1 tablet (0.125 mg total) by mouth daily. 90 tablet 3  . FARXIGA 10 MG TABS tablet Take 10 mg by mouth daily.    . furosemide (LASIX) 20 MG tablet Take 1 tablet (20 mg total) by mouth daily. 90 tablet 3  . glipiZIDE (GLUCOTROL XL) 10 MG 24 hr tablet Take 1 tablet by mouth 2 (two) times daily.     . hydrOXYzine (ATARAX/VISTARIL) 25 MG tablet Take 25 mg by mouth every 8 (eight) hours as needed for anxiety.     . Insulin Pen Needle 32G X 6 MM MISC 1 Dose by Does not apply route once a week.    . METFORMIN HCL PO Take 1 tablet by mouth in the morning and at bedtime. Patient not sure of tablet dose    . nitroGLYCERIN (NITROSTAT) 0.4 MG SL tablet Place 1 tablet (0.4 mg total) under the tongue every  5 (five) minutes as needed for chest pain (up to 3 doses MAX). 25 tablet 2  . oxyCODONE-acetaminophen (PERCOCET) 10-325 MG per tablet Take 1 tablet by mouth every 6 (six) hours as needed for pain.     . pantoprazole (PROTONIX) 40 MG tablet Take 40 mg by mouth daily.     . pioglitazone (ACTOS) 30 MG tablet Take 30 mg by mouth daily.    . potassium chloride SA (KLOR-CON) 20 MEQ tablet Take 40 meq (2 tablets) by mouth twice a day for 2 days, then take 40 meq (2 tablets) by mouth daily. 180 tablet 3  . pregabalin (LYRICA) 75 MG capsule Take 75 mg by mouth 2 (two) times daily.    . sacubitril-valsartan (ENTRESTO) 24-26 MG Take 1 tablet by mouth 2 (two) times daily. 180 tablet 3  . tiZANidine (ZANAFLEX) 4 MG tablet Take 4 mg by mouth every 6 (six) hours as needed for muscle spasms.    . Vitamin D, Ergocalciferol, (DRISDOL) 50000 units CAPS capsule Take 50,000 Units by mouth 2 (two) times a week.      No current facility-administered medications on file prior to visit.    Allergies  Allergen Reactions  . Duloxetine Hcl Other (See Comments)    Made heart race  . Penicillins Hives     Assessment/Plan:  1. CHF - Patient BP in room checked manually was 70/50.  Rechecked using machine: 68/48.  Patient not symptomatic.  Patient very sensitive to [facility].  Recommend discontinuing Entresto at this time and rechecking BP in 1 week.  Will check BMP today to verify kidney function.  Continue all other CHF meds.  Consider change from [facility] to different [facility] at next visit.  Since patient under a lot of stress, asked if she would be interested in speaking with anyone.  Patient declined.    Recommended patient change dietary habits, including discontinuing sodas and other foods with added sugars due to cholesterol and blood sugar.  Unknown A!c, however patient random glucose levels very high.   Continue carvedilol 141mcg Continue carvedilol 6.25mg  BID Continue farxiga 10 mg daily Continue furosemide  20-40 mg daily as needed Continue Ozempic 0.25mg  weekly Stop Entresto 24-26 BID  Emily Mays, PharmD, BCACP, CDCES, [place] [address], [place], [place] [address] Phone: [phone]; Fax: [phone] 04/19/2020 12:23 PM

## 2020-04-19 NOTE — Addendum Note (Signed)
Addended by: Eulis Foster on: 04/19/2020 11:30 AM   Modules accepted: Orders

## 2020-04-19 NOTE — Patient Instructions (Addendum)
It was good seeing you today!  Your blood pressure was very low today so we are going to stop your Entresto 24/26 twice daily  Continue your carvedilol 6.25mg  twice a day, digoxin 158mcg daily, your farxiga 10mg  daily, and your furosemide 20-40 mg daily  We will check you lab work today and I will call you with the results  Recheck in 1 week  Karren Cobble, PharmD, BCACP, [place], [place] [address] [place], [place] [address] Phone: [phone]; Fax: [phone] 04/19/2020 11:17 AM

## 2020-04-20 ENCOUNTER — Telehealth: Payer: Self-pay | Admitting: Pharmacist

## 2020-04-20 LAB — BASIC METABOLIC PANEL
BUN/Creatinine Ratio: 17 (ref 9–23)
BUN: 20 mg/dL (ref 6–24)
CO2: 21 mmol/L (ref 20–29)
Calcium: 9.4 mg/dL (ref 8.7–10.2)
Chloride: 102 mmol/L (ref 96–106)
Creatinine, Ser: 1.21 mg/dL — ABNORMAL HIGH (ref 0.57–1.00)
GFR calc Af Amer: 58 mL/min/{1.73_m2} — ABNORMAL LOW (ref >59)
GFR calc non Af Amer: 50 mL/min/{1.73_m2} — ABNORMAL LOW (ref >59)
Glucose: 353 mg/dL — ABNORMAL HIGH (ref 65–99)
Potassium: 4.5 mmol/L (ref 3.5–5.2)
Sodium: 140 mmol/L (ref 134–144)

## 2020-04-20 NOTE — Telephone Encounter (Signed)
Renal function declined in past month. Emily Mays has already been discontinued. Patient has recheck HTN clinic appt in 1 week. Will recheck at that time

## 2020-04-27 ENCOUNTER — Ambulatory Visit (INDEPENDENT_AMBULATORY_CARE_PROVIDER_SITE_OTHER): Payer: Self-pay | Admitting: Pharmacist

## 2020-04-27 ENCOUNTER — Other Ambulatory Visit: Payer: Self-pay

## 2020-04-27 VITALS — BP 108/70 | HR 75

## 2020-04-27 DIAGNOSIS — I10 Essential (primary) hypertension: Secondary | ICD-10-CM

## 2020-04-27 DIAGNOSIS — I5042 Chronic combined systolic (congestive) and diastolic (congestive) heart failure: Secondary | ICD-10-CM

## 2020-04-27 LAB — BASIC METABOLIC PANEL
BUN/Creatinine Ratio: 17 (ref 9–23)
BUN: 21 mg/dL (ref 6–24)
CO2: 24 mmol/L (ref 20–29)
Calcium: 9.8 mg/dL (ref 8.7–10.2)
Chloride: 103 mmol/L (ref 96–106)
Creatinine, Ser: 1.24 mg/dL — ABNORMAL HIGH (ref 0.57–1.00)
GFR calc Af Amer: 56 mL/min/{1.73_m2} — ABNORMAL LOW (ref >59)
GFR calc non Af Amer: 49 mL/min/{1.73_m2} — ABNORMAL LOW (ref >59)
Glucose: 341 mg/dL — ABNORMAL HIGH (ref 65–99)
Potassium: 4.5 mmol/L (ref 3.5–5.2)
Sodium: 139 mmol/L (ref 134–144)

## 2020-04-27 NOTE — Patient Instructions (Addendum)
It was nice to meet you today!  I will call you tomorrow with your lab results. If everything looks good, will plan to start losartan 12.5mg  daily. I will call you tomorrow to finalize that plan.  Please check your blood pressure at home a few times a day.  Call me at [phone] with any questions

## 2020-04-27 NOTE — Progress Notes (Signed)
Patient ID: Emily Mays                 DOB: June 08, 1960                      MRN: [id number]     HPI: Emily Mays is a 59 y.o. female referred by Dr. Burt Knack to HTN clinic. PMH is significant for CHF, CAD, ICD, Hypertension (also in last year has been hypotensive), and DM.  Has history of MI. EF 30-35% on echo 11/10/19.  At last visit on 12/6 patient's Emily Mays was stopped due to hypotension. She presents today for follow up. Patient admits to being under a lot of stress, daughter about to have surgery and mom is in a SNF. She denies dizziness, lightheadedness, headache, blurred vision, SOB or swelling. She has not been checking her blood sugars or blood pressure. States Ozempic has been curbing her appetite and she has been loosing weight. States she didn't get DM until she started taking all these cardiac meds.  Current HTN meds: carvedilol 6.25 mg BID, digoxin 125 mcg daily, farxiga 10 mg daily, furosemide 20-40 mg daily Previously tried: Entresto (hypotension) BP goal: <130/80  Family History: Father - CAD, MI, Mother - valvular disorders.  Social History: quit smoking 11 years ago.  No alcohol  Diet: Drinks at least 2 regular sodas a day  Exercise: walking every day.    Home BP readings: none available  Wt Readings from Last 3 Encounters:  04/19/20 185 lb (83.9 kg)  03/29/20 177 lb 3.2 oz (80.4 kg)  01/13/20 175 lb (79.4 kg)   BP Readings from Last 3 Encounters:  04/19/20 (!) 69/48  03/29/20 128/88  01/13/20 92/60   Pulse Readings from Last 3 Encounters:  04/19/20 83  03/29/20 (!) 104  01/13/20 87    Renal function: Estimated Creatinine Clearance: 54.4 mL/min (A) (by C-G formula based on SCr of 1.21 mg/dL (H)).  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 s/p MI 07/24/08 with  PCI Endeavor DES to Prox. LAD and BMA to prox. LCX.. relook cath 07/28/08 showing cont. patency of LAD to LCX.   Cancer [facility])    uterine cancer   Chronic systolic HF (heart failure) (HCC)    COPD (chronic obstructive pulmonary disease) (HCC)    no inhaler   DDD (degenerative disc disease)    Diabetes mellitus without complication (HCC)    type 2   Esophageal reflux    GERD (gastroesophageal reflux disease)    HLD (hyperlipidemia)    Hypokalemia    Myocardial infarction [facility])    Other specified forms of chronic ischemic heart disease    echo 07/26/2008 EfF 30% with perapical HK   Paralytic ileus ([place])    Pneumonia    x 1   Unspecified essential hypertension     Current Outpatient Medications on File Prior to Visit  Medication Sig Dispense Refill   atorvastatin (LIPITOR) 40 MG tablet Take 1 tablet (40 mg total) by mouth daily. 90 tablet 3   Black Cohosh 40 MG CAPS Take 40 mg by mouth 2 (two) times daily.     carvedilol (COREG) 6.25 MG tablet Take 1 tablet (6.25 mg total) by mouth 2 (two) times daily. 180 tablet 3   Cholecalciferol (VITAMIN D3) 125 MCG (  5000 UT) CHEW Chew 1 tablet by mouth daily.     DIGOX 125 MCG tablet Take 1 tablet (0.125 mg total) by mouth daily. 90 tablet 3   FARXIGA 10 MG TABS tablet Take 10 mg by mouth daily.     furosemide (LASIX) 20 MG tablet Take 1 tablet (20 mg total) by mouth daily. 90 tablet 3   glipiZIDE (GLUCOTROL XL) 10 MG 24 hr tablet Take 1 tablet by mouth 2 (two) times daily.      hydrOXYzine (ATARAX/VISTARIL) 25 MG tablet Take 25 mg by mouth every 8 (eight) hours as needed for anxiety.      Insulin Pen Needle 32G X 6 MM MISC 1 Dose by Does not apply route once a week.     METFORMIN HCL PO Take 1 tablet by mouth in the morning and at bedtime. Patient not sure of tablet dose. Maybe 500mg      nitroGLYCERIN (NITROSTAT) 0.4 MG SL tablet Place 1 tablet (0.4 mg total) under the tongue every 5 (five) minutes as needed for chest pain  (up to 3 doses MAX). 25 tablet 2   oxyCODONE-acetaminophen (PERCOCET) 10-325 MG per tablet Take 1 tablet by mouth every 6 (six) hours as needed for pain.      pantoprazole (PROTONIX) 40 MG tablet Take 40 mg by mouth daily.      pioglitazone (ACTOS) 30 MG tablet Take 30 mg by mouth daily.     potassium chloride SA (KLOR-CON) 20 MEQ tablet Take 40 meq (2 tablets) by mouth twice a day for 2 days, then take 40 meq (2 tablets) by mouth daily. 180 tablet 3   pregabalin (LYRICA) 75 MG capsule Take 75 mg by mouth 2 (two) times daily.     Semaglutide,0.25 or 0.5MG /DOS, (OZEMPIC, 0.25 OR 0.5 MG/DOSE,) 2 MG/1.5ML SOPN Inject 0.25 mg into the skin once a week.     tiZANidine (ZANAFLEX) 4 MG tablet Take 4 mg by mouth every 6 (six) hours as needed for muscle spasms.     Vitamin D, Ergocalciferol, (DRISDOL) 50000 units CAPS capsule Take 50,000 Units by mouth 2 (two) times a week.      No current facility-administered medications on file prior to visit.    Allergies  Allergen Reactions   Duloxetine Hcl Other (See Comments)    Made heart race   Penicillins Hives     Assessment/Plan:  1. CHF - Blood pressure in clinic today much better today than last week. Patient denies any s/sx of hypotension. Will recheck BMP today due to bump in scr last week. Will make sure scr went back to baseline since stopping Entresto. If labs look good, will plan to start losartan 12.5mg  daily.  Continue carvedilol 6.25 mg BID, digoxin 125 mcg daily, farxiga 10 mg daily and furosemide 20-40 mg daily. I will call patient tomorrow after labs result to finalize the plan. Will schedule follow up once plan is decided.  I did discus with the patient the importance of blood sugar control and the dangers of sugar. Patient states that she gave up smoking, she isn't giving up soda. I suggested that she work on cutting back on soda (currently drinks 1.5-2 per day). I also recommended that she start looking at the food labels for  added sugar. Patient stated that she appreciated me trying to help her, although I do not think she intends of making dietary changes.    Ramond Dial, Pharm.D, BCPS, CPP [place]  [address]  [address] [place], [place] [address]  Phone: [phone]; Fax: [phone]  04/27/2020 8:58 AM

## 2020-04-28 ENCOUNTER — Telehealth: Payer: Self-pay | Admitting: Pharmacist

## 2020-04-28 MED ORDER — LOSARTAN POTASSIUM 25 MG PO TABS
12.5000 mg | ORAL_TABLET | Freq: Every day | ORAL | 11 refills | Status: DC
Start: 2020-04-28 — End: 2020-05-13

## 2020-04-28 NOTE — Telephone Encounter (Addendum)
BMP is relatively stable based off of hx from the last year. Will plan to add low dose losartan 12.5mg  daily. Spoke with patient she is in agreement to start losartan 12.5mg  daily. Rx sent to pharmacy. Follow up in 2 weeks.

## 2020-04-29 NOTE — Progress Notes (Signed)
Remote ICD transmission.   

## 2020-05-12 ENCOUNTER — Other Ambulatory Visit: Payer: Self-pay | Admitting: Physician Assistant

## 2020-05-13 ENCOUNTER — Ambulatory Visit (INDEPENDENT_AMBULATORY_CARE_PROVIDER_SITE_OTHER): Payer: Self-pay | Admitting: Pharmacist

## 2020-05-13 ENCOUNTER — Other Ambulatory Visit: Payer: Self-pay

## 2020-05-13 DIAGNOSIS — I5042 Chronic combined systolic (congestive) and diastolic (congestive) heart failure: Secondary | ICD-10-CM

## 2020-05-13 NOTE — Progress Notes (Signed)
Patient ID: Emily Mays                 DOB: 05/06/1961                      MRN: [id number]     HPI: Emily Mays is a 59 y.o. female referred by Dr. Excell Seltzer to HTN clinic. PMH is significant for CHF, CAD, ICD, Hypertension (also in last year has been hypotensive), and DM.  Has history of MI. EF 30-35% on echo 11/10/19.  At visit on 12/6 patient's Emily Mays was stopped due to hypotension. Blood pressure improved at visit with PharmD on 12/15 so losartan 12.5mg  daily was started. Patient had not been checking her blood pressure or sugar at home.   Patient presents today for follow up. She states that for the past few days she has felt bad. No energy and a little bit of dizziness. Checked her BP a few times. All readings <100 systolic. Checked blood sugar today >400.  She is on ozempic, currently titrating. On Actos, metformin, glipizide, and [place].  Blood pressure in clinic 100/64   Current HTN meds: losartan 12.5mg  daily, carvedilol 6.25 mg BID, digoxin 125 mcg daily, farxiga 10 mg daily, furosemide 20-40 mg daily Previously tried: Entresto (hypotension) BP goal: <130/80  Family History: Father - CAD, MI, Mother - valvular disorders.  Social History: quit smoking 11 years ago.  No alcohol  Diet: Drinks at least 2 regular sodas a day  Exercise: walking every day.    Home BP readings: 84/57, 88/58, 72/53, 94/57, 84/60, 85/53  Wt Readings from Last 3 Encounters:  04/19/20 185 lb (83.9 kg)  03/29/20 177 lb 3.2 oz (80.4 kg)  01/13/20 175 lb (79.4 kg)   BP Readings from Last 3 Encounters:  04/27/20 108/70  04/19/20 (!) 69/48  03/29/20 128/88   Pulse Readings from Last 3 Encounters:  04/27/20 75  04/19/20 83  03/29/20 (!) 104    Renal function: CrCl cannot be calculated (Unknown ideal weight.).  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 s/p MI 07/24/08 with PCI Endeavor DES to Prox. LAD and BMA to prox. LCX.. relook cath 07/28/08 showing cont. patency of LAD to LCX.   Cancer [facility])    uterine cancer   Chronic systolic HF (heart failure) (HCC)    COPD (chronic obstructive pulmonary disease) (HCC)    no inhaler   DDD (degenerative disc disease)    Diabetes mellitus without complication (HCC)    type 2   Esophageal reflux    GERD (gastroesophageal reflux disease)    HLD (hyperlipidemia)    Hypokalemia    Myocardial infarction [facility])    Other specified forms of chronic ischemic heart disease    echo 07/26/2008 EfF 30% with perapical HK   Paralytic ileus (HCC)    Pneumonia    x 1   Unspecified essential hypertension     Current Outpatient Medications on File Prior to Visit  Medication Sig Dispense Refill   atorvastatin (LIPITOR) 40 MG tablet TAKE ONE TABLET BY MOUTH EVERY DAY 90 tablet 3   Black Cohosh 40 MG CAPS Take 40 mg by mouth 2 (two) times daily.     carvedilol (COREG) 6.25 MG tablet Take 1 tablet (6.25 mg total) by mouth 2 (two)  times daily. 180 tablet 3   Cholecalciferol (VITAMIN D3) 125 MCG (5000 UT) CHEW Chew 1 tablet by mouth daily.     DIGOX 125 MCG tablet Take 1 tablet (0.125 mg total) by mouth daily. 90 tablet 3   FARXIGA 10 MG TABS tablet Take 10 mg by mouth daily.     furosemide (LASIX) 20 MG tablet Take 1 tablet (20 mg total) by mouth daily. 90 tablet 3   glipiZIDE (GLUCOTROL XL) 10 MG 24 hr tablet Take 1 tablet by mouth daily with breakfast.     hydrOXYzine (ATARAX/VISTARIL) 25 MG tablet Take 25 mg by mouth every 8 (eight) hours as needed for anxiety.      Insulin Pen Needle 32G X 6 MM MISC 1 Dose by Does not apply route once a week.     losartan (COZAAR) 25 MG tablet Take 0.5 tablets (12.5 mg total) by mouth daily. 15 tablet 11   METFORMIN HCL PO Take 1 tablet by mouth in the morning and at bedtime. Patient not sure  of tablet dose. Maybe 500mg      nitroGLYCERIN (NITROSTAT) 0.4 MG SL tablet Place 1 tablet (0.4 mg total) under the tongue every 5 (five) minutes as needed for chest pain (up to 3 doses MAX). 25 tablet 2   oxyCODONE-acetaminophen (PERCOCET) 10-325 MG per tablet Take 1 tablet by mouth every 6 (six) hours as needed for pain.      pantoprazole (PROTONIX) 40 MG tablet Take 40 mg by mouth daily.     potassium chloride SA (KLOR-CON) 20 MEQ tablet Take 40 meq (2 tablets) by mouth twice a day for 2 days, then take 40 meq (2 tablets) by mouth daily. (Patient taking differently: 20 mEq. 1-2 tablets daily (2 if she takes 40mg  of furosemide)) 180 tablet 3   pregabalin (LYRICA) 75 MG capsule Take 75 mg by mouth 2 (two) times daily.     Semaglutide,0.25 or 0.5MG /DOS, (OZEMPIC, 0.25 OR 0.5 MG/DOSE,) 2 MG/1.5ML SOPN Inject 0.25 mg into the skin once a week.     tiZANidine (ZANAFLEX) 4 MG tablet Take 4 mg by mouth every 6 (six) hours as needed for muscle spasms.     Vitamin D, Ergocalciferol, (DRISDOL) 50000 units CAPS capsule Take 50,000 Units by mouth 2 (two) times a week.  (Patient not taking: Reported on 04/27/2020)     No current facility-administered medications on file prior to visit.    Allergies  Allergen Reactions   Duloxetine Hcl Other (See Comments)    Made heart race   Penicillins Hives     Assessment/Plan:  1. CHF - Blood pressure ok in clinic today, however home readings very low [id number] systolic. She is also fatigued. I will have patient stop losartan. Continue carvedilol 6.25 mg BID, digoxin 125 mcg daily, farxiga 10 mg daily and furosemide 20-40 mg daily. I advised that she follow up with PCP about her blood sugar. Also advised that Actos is not ideal for patients with CHF. However patient's fluid status is fine. No swelling, does get a little winded. Advised she discuss with PCP actos use. Follow up with Nicki Reaper in Feb.   Emily Mays, Pharm.D, BCPS, CPP [place]  [id number] [place], [place] [address]  Phone: [phone]; Fax: [phone]  05/13/2020 9:46 AM

## 2020-05-13 NOTE — Patient Instructions (Addendum)
STOP taking losartan  Continue taking carvedilol 6.25 mg BID, digoxin 125 mcg daily, farxiga 10 mg daily, furosemide 20-40 mg daily  Call me at [phone] with any questions  Please speak with your primary care doctor about your blood sugar

## 2020-06-16 ENCOUNTER — Other Ambulatory Visit: Payer: Self-pay | Admitting: Physician Assistant

## 2020-07-09 ENCOUNTER — Ambulatory Visit: Payer: Self-pay | Admitting: Physician Assistant

## 2020-07-19 ENCOUNTER — Ambulatory Visit (INDEPENDENT_AMBULATORY_CARE_PROVIDER_SITE_OTHER): Payer: Self-pay

## 2020-07-19 DIAGNOSIS — I255 Ischemic cardiomyopathy: Secondary | ICD-10-CM

## 2020-07-19 DIAGNOSIS — I5022 Chronic systolic (congestive) heart failure: Secondary | ICD-10-CM

## 2020-07-21 LAB — CUP PACEART REMOTE DEVICE CHECK
Battery Remaining Longevity: 44 mo
Battery Remaining Percentage: 41 %
Battery Voltage: 2.93 V
Brady Statistic RV Percent Paced: 1 %
Date Time Interrogation Session: 20220307211051
HighPow Impedance: 71 Ohm
HighPow Impedance: 71 Ohm
Implantable Lead Implant Date: 20101228
Implantable Lead Location: 753860
Implantable Lead Model: 7122
Implantable Pulse Generator Implant Date: 20150102
Lead Channel Impedance Value: 780 Ohm
Lead Channel Pacing Threshold Amplitude: 1 V
Lead Channel Pacing Threshold Pulse Width: 0.5 ms
Lead Channel Sensing Intrinsic Amplitude: 12 mV
Lead Channel Setting Pacing Amplitude: 2.5 V
Lead Channel Setting Pacing Pulse Width: 0.5 ms
Lead Channel Setting Sensing Sensitivity: 0.5 mV
Pulse Gen Serial Number: 7152745

## 2020-07-27 NOTE — Progress Notes (Signed)
Remote ICD transmission.   

## 2020-09-16 ENCOUNTER — Telehealth: Payer: Self-pay

## 2020-09-16 NOTE — Telephone Encounter (Signed)
**Note De-Identified Broxton Broady Obfuscation** I started a Digoxin PA through covermymeds. Key: [id number]

## 2020-10-18 ENCOUNTER — Ambulatory Visit (INDEPENDENT_AMBULATORY_CARE_PROVIDER_SITE_OTHER): Payer: Self-pay

## 2020-10-18 DIAGNOSIS — I255 Ischemic cardiomyopathy: Secondary | ICD-10-CM

## 2020-10-20 LAB — CUP PACEART REMOTE DEVICE CHECK
Battery Remaining Longevity: 43 mo
Battery Remaining Percentage: 39 %
Battery Voltage: 2.92 V
Brady Statistic RV Percent Paced: 1 %
Date Time Interrogation Session: 20220608071959
HighPow Impedance: 55 Ohm
HighPow Impedance: 55 Ohm
Implantable Lead Implant Date: 20101228
Implantable Lead Location: 753860
Implantable Lead Model: 7122
Implantable Pulse Generator Implant Date: 20150102
Lead Channel Impedance Value: 690 Ohm
Lead Channel Pacing Threshold Amplitude: 1 V
Lead Channel Pacing Threshold Pulse Width: 0.5 ms
Lead Channel Sensing Intrinsic Amplitude: 11.7 mV
Lead Channel Setting Pacing Amplitude: 2.5 V
Lead Channel Setting Pacing Pulse Width: 0.5 ms
Lead Channel Setting Sensing Sensitivity: 0.5 mV
Pulse Gen Serial Number: 7152745

## 2020-11-09 NOTE — Progress Notes (Signed)
Remote ICD transmission.   

## 2021-01-13 ENCOUNTER — Telehealth: Payer: Self-pay

## 2021-01-13 NOTE — Telephone Encounter (Addendum)
**Note De-Identified Rosalene Wardrop Obfuscation** [facility] [place] Key: [id number] - Rx #[id number] Outcome Available without authorization. Drug Digoxin 125MCG tablets Form: [employer] Electronic PA Form  Also, See phone note from 09/16/20 for more details.

## 2021-01-13 NOTE — Telephone Encounter (Signed)
**Note De-Identified Tyron Manetta Obfuscation** I started the Digoxin PA through covermymeds and received the following message: KYRRA JEANNOT Key: [id number] - Rx #: [id number] Outcome: Available without authorization. Drug: Digoxin 125MCG tablets Form: [employer] Electronic PA Form Original Claim Info 70,A5  I have notified [place] of this outcome.

## 2021-01-13 NOTE — Telephone Encounter (Signed)
**Note De-Identified Tecia Cinnamon Obfuscation** NA received from Covermymeds concerning this Digoxin PA so I called [facility] to get an update on the need for a PA.  Per pharmacist at [facility] the pt picked up her last refill of Digoxin on 7/29 and that she paid $52.21/90 day supply and that this is the price she has been paying for her Digoxin refills.  The pharmacist created a new Digoxin PA request through covermymeds and I will attempt once we receive the fax from [place] with Key#.

## 2021-01-19 ENCOUNTER — Other Ambulatory Visit: Payer: Self-pay | Admitting: Physician Assistant

## 2021-01-25 NOTE — Progress Notes (Deleted)
Cardiology Office Note:    Date:  01/25/2021   ID:  Emily Mays, DOB [birth year], MRN [id number]  PCP:  Bonnita Nasuti, MD   [facility] Providers Cardiologist:  Sherren Mocha, MD Electrophysiologist:  Thompson Grayer, MD { Click to update primary MD,subspecialty MD or APP then REFRESH:1}  ***  Referring MD: Bonnita Nasuti, MD   Chief Complaint:  No chief complaint on file. {Click here for Visit Info    :1}   Patient Profile:    Emily Mays is a 60 y.o. female with:  Coronary artery disease  S/p SCAD (LAD) in 2010 >> s/p extensive stenting of LAD and LCx Systolic CHF Ischemic CM cMRI 02/2019: EF 35 Echocardiogram 8/16: EF 35-40 S/p ICD  Hypertension  GERD COPD Diabetes mellitus  GERD Hyperlipidemia    Prior CV studies:   Echocardiogram 11/10/19 EF 30-35, Gr 1 DD, ant-sept and apical AK, mild reduced RVSF   Echo 12/22/14 EF 35-40, anteroseptal and apical AK, grade 1 diastolic dysfunction   Myoview 3/11 Anterior, anterolateral, anteroseptal, inferior, inferolateral, apical scar, no ischemia, EF 40   cMRI 02/2009 Impression 1)    Moderate LVE with anterior, apical and septal akineis.  EF 35% 2)    Full thickness scar with thinning of the mid and distal anterior wall, septum and apex. 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 Burt Knack in 11/21.  Prior to this, several of her heart failure medications have been stopped due to hypotension.  She was placed back on Entresto.  She had follow-up in pharmacy clinic and was eventually taken off of Entresto again due to hypotension.  She was placed on losartan but had to stop this as well due to hypotension.  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 s/p MI 07/24/08 with PCI Endeavor DES to Prox. LAD and BMA to prox. LCX.. relook cath 07/28/08 showing cont. patency of LAD to LCX.   Cancer [facility])    uterine cancer   Chronic systolic HF (heart failure) (HCC)    COPD (chronic obstructive pulmonary disease) (HCC)    no inhaler   DDD (degenerative disc disease)    Diabetes mellitus without complication (HCC)    type 2   Esophageal reflux    GERD (gastroesophageal reflux disease)    HLD (hyperlipidemia)    Hypokalemia    Myocardial infarction [facility])    Other specified forms of chronic ischemic heart disease    echo 07/26/2008 EfF 30% with perapical HK   Paralytic ileus ([place])    Pneumonia    x 1   Unspecified essential hypertension     Current Medications: No outpatient medications have been marked as taking for the 01/26/21 encounter (Appointment) with Richardson Dopp T, PA-C.     Allergies:   Duloxetine hcl and Penicillins   Social History   Tobacco Use   Smoking status: Former    Packs/day: 0.50    Types: Cigarettes   Smokeless  tobacco: Never   Tobacco comments:    used to smoke 1/2 ppd but has not smoked since discharge.   Vaping Use   Vaping Use: Never used  Substance Use Topics   Alcohol use: Yes    Comment: Occasional    Drug use: No     Family Hx: The patient's family history includes Breast cancer in her paternal aunt; CAD in her mother; Cancer in her father; Kidney cancer in her paternal aunt; Other (age of onset: 24) in her father; Throat cancer in her paternal uncle.  ROS   EKGs/Labs/Other Test Reviewed:    EKG:  EKG is *** ordered today.  The ekg ordered today demonstrates ***  Recent Labs: 04/27/2020: BUN 21; Creatinine, Ser 1.24; Potassium 4.5; Sodium 139   Recent Lipid Panel Lab  Results  Component Value Date/Time   CHOL 117 04/04/2010 09:35 AM   TRIG 151.0 (H) 04/04/2010 09:35 AM   HDL 31.70 (L) 04/04/2010 09:35 AM   LDLCALC 55 04/04/2010 09:35 AM   LDLDIRECT 71.4 09/17/2009 10:44 AM      Risk Assessment/Calculations:   {Does this patient have ATRIAL FIBRILLATION?:[id number]}      Physical Exam:    VS:  There were no vitals taken for this visit.    Wt Readings from Last 3 Encounters:  04/19/20 185 lb (83.9 kg)  03/29/20 177 lb 3.2 oz (80.4 kg)  01/13/20 175 lb (79.4 kg)     Physical Exam ***     ASSESSMENT & PLAN:    {Select for Dx:25819} 1. Chronic systolic congestive heart failure (HCC) EF 30-35 by echocardiogram June 2021.  Ischemic cardiomyopathy.  NYHA II.  Volume status appears stable.  Blood pressure has limited CHF therapy.  Her Entresto and spironolactone had to be discontinued.  She is now only taking furosemide 20 mg daily.  Her carvedilol dose has also been reduced.  Obtain follow-up BMET, digoxin level today.  Decrease carvedilol to 6.25 mg twice daily.  Follow-up in 3 months.  Once her blood pressure improves, we could consider adding low-dose ARB back to her medical regimen.   2. Coronary artery disease involving native coronary artery of native heart without angina pectoris Hx of SCAD in 2010 treated with extensive stenting of the LAD and LCx.  No anginal symptoms.  Continue ASA, beta-blocker, statin.    3. ICD (implantable cardioverter-defibrillator) in place FU with EP as planned.     {Are you ordering a CV Procedure (e.g. stress test, cath, DCCV, TEE, etc)?   Press F2        :[id number]    Dispo:  No follow-ups on file.   Medication Adjustments/Labs and Tests Ordered: Current medicines are reviewed at length with the patient today.  Concerns regarding medicines are outlined above.  Tests Ordered: No orders of the defined types were placed in this encounter.  Medication Changes: No orders of the defined types were placed  in this encounter.   Signed, Richardson Dopp, PA-C  01/25/2021 3:02 PM    [facility] [place], [place],   [address] Phone: [phone]; Fax: [phone]

## 2021-01-26 ENCOUNTER — Ambulatory Visit: Payer: Self-pay | Admitting: Physician Assistant

## 2021-01-26 DIAGNOSIS — Z95810 Presence of automatic (implantable) cardiac defibrillator: Secondary | ICD-10-CM

## 2021-01-26 DIAGNOSIS — I2510 Atherosclerotic heart disease of native coronary artery without angina pectoris: Secondary | ICD-10-CM

## 2021-01-26 DIAGNOSIS — E7800 Pure hypercholesterolemia, unspecified: Secondary | ICD-10-CM

## 2021-01-26 DIAGNOSIS — I5020 Unspecified systolic (congestive) heart failure: Secondary | ICD-10-CM

## 2021-02-02 ENCOUNTER — Ambulatory Visit (INDEPENDENT_AMBULATORY_CARE_PROVIDER_SITE_OTHER): Payer: Self-pay

## 2021-02-02 DIAGNOSIS — I255 Ischemic cardiomyopathy: Secondary | ICD-10-CM

## 2021-02-03 LAB — CUP PACEART REMOTE DEVICE CHECK
Battery Remaining Longevity: 40 mo
Battery Remaining Percentage: 37 %
Battery Voltage: 2.92 V
Brady Statistic RV Percent Paced: 1 %
Date Time Interrogation Session: 20220920192303
HighPow Impedance: 55 Ohm
HighPow Impedance: 55 Ohm
Implantable Lead Implant Date: 20101228
Implantable Lead Location: 753860
Implantable Lead Model: 7122
Implantable Pulse Generator Implant Date: 20150102
Lead Channel Impedance Value: 660 Ohm
Lead Channel Pacing Threshold Amplitude: 1 V
Lead Channel Pacing Threshold Pulse Width: 0.5 ms
Lead Channel Sensing Intrinsic Amplitude: 11.7 mV
Lead Channel Setting Pacing Amplitude: 2.5 V
Lead Channel Setting Pacing Pulse Width: 0.5 ms
Lead Channel Setting Sensing Sensitivity: 0.5 mV
Pulse Gen Serial Number: 7152745

## 2021-02-08 NOTE — Progress Notes (Signed)
Electrophysiology Office Note Date: 02/09/2021  ID:  Ailynn, Gow 1960-11-12, MRN [id number]  PCP: Bonnita Nasuti, MD Primary Cardiologist: Sherren Mocha, MD Electrophysiologist: Thompson Grayer, MD   CC: Routine ICD follow-up  Emily Mays is a 60 y.o. female seen today for Thompson Grayer, MD for routine electrophysiology followup.  Since last being seen in our clinic the patient reports doing very well.  she denies chest pain, palpitations, dyspnea, PND, orthopnea, nausea, vomiting, dizziness, syncope, edema, weight gain, or early satiety. He has not had ICD shocks.   Device History: SJM single chamber ICD implanted 05/11/2009, gen change 2015  (gen change done 2/2 capacitors could not change at that the device was malfunctioning) at the time of her gen change report notes  SJM 7122-65 Durata lead was thoroughly evaluated with Cine in the AP position (5 minutes required).  There was no evidence for wire extrusion from the lead.  The lead was satisfactory and stable  Past Medical History:  Diagnosis Date   AICD (automatic cardioverter/defibrillator) present 04/2009   St Jude model [phone]   Anemia    CAD (coronary artery disease)    spontaneous LAD dissection presenting with anterior STEMI. s/p MI 07/24/08 with PCI Endeavor DES to Prox. LAD and BMA to prox. LCX.. relook cath 07/28/08 showing cont. patency of LAD to LCX.   Cancer [facility])    uterine cancer   Chronic systolic HF (heart failure) (HCC)    COPD (chronic obstructive pulmonary disease) (HCC)    no inhaler   DDD (degenerative disc disease)    Diabetes mellitus without complication (HCC)    type 2   Esophageal reflux    GERD (gastroesophageal reflux disease)    HLD (hyperlipidemia)    Hypokalemia    Myocardial infarction [facility])    Other specified forms of chronic ischemic heart disease    echo 07/26/2008 EfF 30% with perapical HK   Paralytic ileus ([place])    Pneumonia    x 1   Unspecified essential hypertension     Past Surgical History:  Procedure Laterality Date   ABDOMINAL HYSTERECTOMY     BILATERAL SALPINGOOPHORECTOMY     CARDIAC CATHETERIZATION  March 2010   2 cardiac stent placement in March 2010.    CARDIAC DEFIBRILLATOR PLACEMENT  december 2010   St. Jude   CHOLECYSTECTOMY     27 years ago   IMPLANTABLE CARDIOVERTER DEFIBRILLATOR GENERATOR CHANGE N/A 05/16/2013   Generator change (SJM Fortify Assura VR) for prior device malfunction (capacitors unable to charge)   left pelvic lymphadenectomy     by Dr. Marti Sleigh    TOTAL ABDOMINAL HYSTERECTOMY W/ BILATERAL SALPINGOOPHORECTOMY     WISDOM TOOTH EXTRACTION      Current Outpatient Medications  Medication Sig Dispense Refill   atorvastatin (LIPITOR) 40 MG tablet TAKE ONE TABLET BY MOUTH EVERY DAY 90 tablet 3   Black Cohosh 40 MG CAPS Take 40 mg by mouth 2 (two) times daily.     carvedilol (COREG) 6.25 MG tablet TAKE ONE TABLET BY MOUTH 2 TIMES A DAY 60 tablet 0   Cholecalciferol (VITAMIN D3) 125 MCG (5000 UT) CHEW Chew 1 tablet by mouth daily.     digoxin (LANOXIN) 0.125 MG tablet TAKE ONE TABLET BY MOUTH EVERY DAY 90 tablet 3   FARXIGA 10 MG TABS tablet Take 10 mg by mouth daily.     furosemide (LASIX) 20 MG tablet Take 1 tablet (20 mg total) by mouth daily. 90 tablet  3   glipiZIDE (GLUCOTROL XL) 10 MG 24 hr tablet Take 1 tablet by mouth daily with breakfast.     hydrOXYzine (ATARAX/VISTARIL) 25 MG tablet Take 25 mg by mouth every 8 (eight) hours as needed for anxiety.      Insulin Pen Needle 32G X 6 MM MISC 1 Dose by Does not apply route once a week.     nitroGLYCERIN (NITROSTAT) 0.4 MG SL tablet Place 1 tablet (0.4 mg total) under the tongue every 5 (five) minutes as needed for chest pain (up to 3 doses MAX). 25 tablet 2   oxyCODONE-acetaminophen (PERCOCET) 10-325 MG per tablet Take 1 tablet by mouth every 6 (six) hours as needed for pain.      pantoprazole (PROTONIX) 40 MG tablet Take 40 mg by mouth daily.     pioglitazone  (ACTOS) 30 MG tablet Take 1 tablet by mouth daily.     pregabalin (LYRICA) 75 MG capsule Take 75 mg by mouth 2 (two) times daily.     Semaglutide,0.25 or 0.5MG /DOS, (OZEMPIC, 0.25 OR 0.5 MG/DOSE,) 2 MG/1.5ML SOPN Inject 0.25 mg into the skin once a week.     tiZANidine (ZANAFLEX) 4 MG tablet Take 4 mg by mouth every 6 (six) hours as needed for muscle spasms.     Vitamin D, Ergocalciferol, (DRISDOL) 50000 units CAPS capsule Take 50,000 Units by mouth 2 (two) times a week.     No current facility-administered medications for this visit.    Allergies:   Duloxetine hcl and Penicillins   Social History: Social History   Socioeconomic History   Marital status: Single    Spouse name: Not on file   Number of children: Not on file   Years of education: Not on file   Highest education level: Not on file  Occupational History   Occupation: Glass blower/designer in Cabin crew: [employer]  Tobacco Use   Smoking status: Former    Packs/day: 0.50    Types: Cigarettes   Smokeless tobacco: Never   Tobacco comments:    used to smoke 1/2 ppd but has not smoked since discharge.   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 Lives in [place] with a roommate.    Social Determinants of Health   Financial Resource Strain: Not on file  Food Insecurity: Not on file  Transportation Needs: Not on file  Physical Activity: Not on file  Stress: Not on file  Social Connections: Not on file  Intimate Partner Violence: Not on file    Family History: Family History  Problem Relation Age of Onset   Cancer Father        Bladder cancer   Other Father 35       Borderline Diabetic   CAD Mother        CAD with previous angioplasty.   Kidney cancer Paternal Aunt     Breast cancer Paternal Aunt    Throat cancer Paternal Uncle     Review of Systems: All other systems reviewed and are otherwise negative except as noted above.   Physical Exam: Vitals:   02/09/21 0928  BP: 118/60  Pulse: 78  SpO2: 96%  Weight: 174 lb 3.2 oz (  79 kg)  Height: 5\' 4"  (1.626 m)     GEN- The patient is well appearing, alert and oriented x 3 today.   HEENT: normocephalic, atraumatic; sclera clear, conjunctiva pink; hearing intact; oropharynx clear; neck supple, no JVP Lymph- no cervical lymphadenopathy Lungs- Clear to ausculation bilaterally, normal work of breathing.  No wheezes, rales, rhonchi Heart- Regular rate and rhythm, no murmurs, rubs or gallops, PMI not laterally displaced GI- soft, non-tender, non-distended, bowel sounds present, no hepatosplenomegaly Extremities- no clubbing or cyanosis. No edema; DP/PT/radial pulses 2+ bilaterally MS- no significant deformity or atrophy Skin- warm and dry, no rash or lesion; ICD pocket well healed Psych- euthymic mood, full affect Neuro- strength and sensation are intact  ICD interrogation- reviewed in detail today,  See PACEART report  EKG:  EKG is not ordered today.  Recent Labs: 04/27/2020: BUN 21; Creatinine, Ser 1.24; Potassium 4.5; Sodium 139   Wt Readings from Last 3 Encounters:  02/09/21 174 lb 3.2 oz (79 kg)  04/19/20 185 lb (83.9 kg)  03/29/20 177 lb 3.2 oz (80.4 kg)     Other studies Reviewed: Additional studies/ records that were reviewed today include: Previous EP office notes.   Assessment and Plan:  1.  Chronic systolic dysfunction s/p St. Jude single chamber ICD  euvolemic today Stable on an appropriate medical regimen Normal ICD function See Pace Art report No changes today  2. ICM Denies s/s ischemia She has not tolerated ARNi or low dose ARB  Current medicines are reviewed at length with the patient today.    Labs/ tests ordered today include:  Orders Placed This Encounter   Procedures   Basic metabolic panel   Digoxin level    Disposition:   Follow up with EP APP in 12 months   Signed, Shirley Friar, PA-C  02/09/2021 9:37 AM  [facility] [address] [place] [address] [phone] (office) [phone] (fax)

## 2021-02-09 ENCOUNTER — Encounter: Payer: Self-pay | Admitting: Student

## 2021-02-09 ENCOUNTER — Other Ambulatory Visit: Payer: Self-pay

## 2021-02-09 ENCOUNTER — Encounter (INDEPENDENT_AMBULATORY_CARE_PROVIDER_SITE_OTHER): Payer: Self-pay

## 2021-02-09 ENCOUNTER — Ambulatory Visit: Payer: Medicare HMO | Admitting: Student

## 2021-02-09 VITALS — BP 118/60 | HR 78 | Ht 64.0 in | Wt 174.2 lb

## 2021-02-09 DIAGNOSIS — I255 Ischemic cardiomyopathy: Secondary | ICD-10-CM | POA: Diagnosis not present

## 2021-02-09 DIAGNOSIS — I2510 Atherosclerotic heart disease of native coronary artery without angina pectoris: Secondary | ICD-10-CM | POA: Diagnosis not present

## 2021-02-09 DIAGNOSIS — I5022 Chronic systolic (congestive) heart failure: Secondary | ICD-10-CM

## 2021-02-09 LAB — CUP PACEART INCLINIC DEVICE CHECK
Battery Remaining Longevity: 39 mo
Brady Statistic RV Percent Paced: 0 %
Date Time Interrogation Session: 20220928094750
HighPow Impedance: 58.5 Ohm
Implantable Lead Implant Date: 20101228
Implantable Lead Location: 753860
Implantable Lead Model: 7122
Implantable Pulse Generator Implant Date: 20150102
Lead Channel Impedance Value: 712.5 Ohm
Lead Channel Pacing Threshold Amplitude: 1 V
Lead Channel Pacing Threshold Amplitude: 1 V
Lead Channel Pacing Threshold Pulse Width: 0.5 ms
Lead Channel Pacing Threshold Pulse Width: 0.5 ms
Lead Channel Sensing Intrinsic Amplitude: 12 mV
Lead Channel Setting Pacing Amplitude: 2.5 V
Lead Channel Setting Pacing Pulse Width: 0.5 ms
Lead Channel Setting Sensing Sensitivity: 0.5 mV
Pulse Gen Serial Number: 7152745

## 2021-02-09 NOTE — Progress Notes (Signed)
Remote ICD transmission.   

## 2021-02-09 NOTE — Patient Instructions (Signed)
Medication Instructions:  Your physician recommends that you continue on your current medications as directed. Please refer to the Current Medication list given to you today.  *If you need a refill on your cardiac medications before your next appointment, please call your pharmacy*   Lab Work: TODAY: BMET, Dig level  If you have labs (blood work) drawn today and your tests are completely normal, you will receive your results only by: [place] (if you have MyChart) OR A paper copy in the mail If you have any lab test that is abnormal or we need to change your treatment, we will call you to review the results.   Follow-Up: At [facility], you and your health needs are our priority.  As part of our continuing mission to provide you with exceptional heart care, we have created designated Provider Care Teams.  These Care Teams include your primary Cardiologist (physician) and Advanced Practice Providers (APPs -  Physician Assistants and Nurse Practitioners) who all work together to provide you with the care you need, when you need it.  We recommend signing up for the patient portal called "MyChart".  Sign up information is provided on this After Visit Summary.  MyChart is used to connect with patients for Virtual Visits (Telemedicine).  Patients are able to view lab/test results, encounter notes, upcoming appointments, etc.  Non-urgent messages can be sent to your provider as well.   To learn more about what you can do with MyChart, go to [email or web address].    Your next appointment:   1 year(s)  The format for your next appointment:   In Person  Provider:   You may see Thompson Grayer, MD or one of the following Advanced Practice Providers on your designated Care Team:   Tommye Standard, [place] Legrand Como "[facility]" [place], [place]

## 2021-02-10 ENCOUNTER — Other Ambulatory Visit: Payer: Self-pay

## 2021-02-10 DIAGNOSIS — Z79899 Other long term (current) drug therapy: Secondary | ICD-10-CM

## 2021-02-10 LAB — BASIC METABOLIC PANEL
BUN/Creatinine Ratio: 14 (ref 9–23)
BUN: 20 mg/dL (ref 6–24)
CO2: 23 mmol/L (ref 20–29)
Calcium: 9.6 mg/dL (ref 8.7–10.2)
Chloride: 103 mmol/L (ref 96–106)
Creatinine, Ser: 1.39 mg/dL — ABNORMAL HIGH (ref 0.57–1.00)
Glucose: 253 mg/dL — ABNORMAL HIGH (ref 70–99)
Potassium: 4 mmol/L (ref 3.5–5.2)
Sodium: 140 mmol/L (ref 134–144)
eGFR: 44 mL/min/{1.73_m2} — ABNORMAL LOW (ref >59)

## 2021-02-10 LAB — DIGOXIN LEVEL: Digoxin, Serum: 1.7 ng/mL — ABNORMAL HIGH (ref 0.5–0.9)

## 2021-02-14 ENCOUNTER — Other Ambulatory Visit: Payer: Medicare HMO | Admitting: *Deleted

## 2021-02-14 ENCOUNTER — Other Ambulatory Visit: Payer: Self-pay

## 2021-02-14 DIAGNOSIS — Z79899 Other long term (current) drug therapy: Secondary | ICD-10-CM

## 2021-02-15 LAB — DIGOXIN LEVEL: Digoxin, Serum: 0.7 ng/mL (ref 0.5–0.9)

## 2021-03-12 ENCOUNTER — Other Ambulatory Visit: Payer: Self-pay | Admitting: Physician Assistant

## 2021-05-18 ENCOUNTER — Other Ambulatory Visit: Payer: Self-pay | Admitting: Cardiovascular Disease

## 2021-06-22 ENCOUNTER — Other Ambulatory Visit: Payer: Self-pay | Admitting: Cardiovascular Disease

## 2021-07-11 ENCOUNTER — Other Ambulatory Visit: Payer: Self-pay | Admitting: Cardiovascular Disease

## 2021-07-29 ENCOUNTER — Other Ambulatory Visit: Payer: Self-pay | Admitting: Cardiovascular Disease

## 2021-08-03 ENCOUNTER — Ambulatory Visit (INDEPENDENT_AMBULATORY_CARE_PROVIDER_SITE_OTHER): Payer: Self-pay

## 2021-08-03 DIAGNOSIS — I255 Ischemic cardiomyopathy: Secondary | ICD-10-CM

## 2021-08-03 DIAGNOSIS — I5022 Chronic systolic (congestive) heart failure: Secondary | ICD-10-CM

## 2021-08-04 LAB — CUP PACEART REMOTE DEVICE CHECK
Battery Remaining Longevity: 36 mo
Battery Remaining Percentage: 33 %
Battery Voltage: 2.9 V
Brady Statistic RV Percent Paced: 0 %
Date Time Interrogation Session: 20230323093952
HighPow Impedance: 64 Ohm
HighPow Impedance: 64 Ohm
Implantable Lead Implant Date: 20101228
Implantable Lead Location: 753860
Implantable Lead Model: 7122
Implantable Pulse Generator Implant Date: 20150102
Lead Channel Impedance Value: 760 Ohm
Lead Channel Pacing Threshold Amplitude: 1 V
Lead Channel Pacing Threshold Pulse Width: 0.5 ms
Lead Channel Sensing Intrinsic Amplitude: 12 mV
Lead Channel Setting Pacing Amplitude: 2.5 V
Lead Channel Setting Pacing Pulse Width: 0.5 ms
Lead Channel Setting Sensing Sensitivity: 0.5 mV
Pulse Gen Serial Number: 7152745

## 2021-08-17 NOTE — Progress Notes (Signed)
Remote ICD transmission.   

## 2021-10-06 ENCOUNTER — Encounter: Payer: Self-pay | Admitting: Cardiovascular Disease

## 2021-10-06 ENCOUNTER — Ambulatory Visit: Payer: Medicare HMO | Admitting: Cardiovascular Disease

## 2021-10-06 VITALS — BP 108/62 | HR 75 | Ht 63.5 in | Wt 179.8 lb

## 2021-10-06 DIAGNOSIS — I2510 Atherosclerotic heart disease of native coronary artery without angina pectoris: Secondary | ICD-10-CM

## 2021-10-06 DIAGNOSIS — Z95810 Presence of automatic (implantable) cardiac defibrillator: Secondary | ICD-10-CM

## 2021-10-06 DIAGNOSIS — I5022 Chronic systolic (congestive) heart failure: Secondary | ICD-10-CM

## 2021-10-06 DIAGNOSIS — E1165 Type 2 diabetes mellitus with hyperglycemia: Secondary | ICD-10-CM | POA: Diagnosis not present

## 2021-10-06 DIAGNOSIS — I255 Ischemic cardiomyopathy: Secondary | ICD-10-CM

## 2021-10-06 NOTE — Patient Instructions (Signed)
Medication Instructions:  Your physician recommends that you continue on your current medications as directed. Please refer to the Current Medication list given to you today.  *If you need a refill on your cardiac medications before your next appointment, please call your pharmacy*   Lab Work: NONE If you have labs (blood work) drawn today and your tests are completely normal, you will receive your results only by: [place] (if you have MyChart) OR A paper copy in the mail If you have any lab test that is abnormal or we need to change your treatment, we will call you to review the results.   Testing/Procedures: NONE (will get ECHO results from PCP)   Follow-Up: At [facility], you and your health needs are our priority.  As part of our continuing mission to provide you with exceptional heart care, we have created designated Provider Care Teams.  These Care Teams include your primary Cardiologist (physician) and Advanced Practice Providers (APPs -  Physician Assistants and Nurse Practitioners) who all work together to provide you with the care you need, when you need it.  We recommend signing up for the patient portal called "MyChart".  Sign up information is provided on this After Visit Summary.  MyChart is used to connect with patients for Virtual Visits (Telemedicine).  Patients are able to view lab/test results, encounter notes, upcoming appointments, etc.  Non-urgent messages can be sent to your provider as well.   To learn more about what you can do with MyChart, go to [email or web address].    Your next appointment:   6 month(s)  The format for your next appointment:   In Person  Provider:   Sherren Mocha, MD       Important Information About Sugar

## 2021-10-06 NOTE — Progress Notes (Signed)
Cardiology Office Note:    Date:  10/16/2021   ID:  Emily Mays, DOB 04/17/1961, MRN [id number]  PCP:  Bonnita Nasuti, MD   [facility] Providers Cardiologist:  Sherren Mocha, MD Electrophysiologist:  Thompson Grayer, MD     Referring MD: Bonnita Nasuti, MD   Chief Complaint  Patient presents with   Congestive Heart Failure    History of Present Illness:    Emily Mays is a 61 y.o. female with a hx of: Coronary artery disease  S/p SCAD (LAD) in 2010 >> s/p extensive stenting of LAD and LCx Systolic CHF Ischemic CM cMRI 02/2019: EF 35 Echocardiogram 8/16: EF 35-40 S/p ICD  Hypertension  GERD  The patient is here alone today.  She has had a difficult time as her daughter died from complications of an infection with sepsis.  The patient reports no new cardiac symptoms.  She has generalized fatigue but no orthopnea or PND.  She denies any recent problems with leg swelling, chest pain, or shortness of breath.  She is compliant with her medical program.  Past Medical History:  Diagnosis Date   AICD (automatic cardioverter/defibrillator) present 04/2009   St Jude model [phone]   Anemia    CAD (coronary artery disease)    spontaneous LAD dissection presenting with anterior STEMI. s/p MI 07/24/08 with PCI Endeavor DES to Prox. LAD and BMA to prox. LCX.. relook cath 07/28/08 showing cont. patency of LAD to LCX.   Cancer [facility])    uterine cancer   Chronic systolic HF (heart failure) (HCC)    COPD (chronic obstructive pulmonary disease) (HCC)    no inhaler   DDD (degenerative disc disease)    Diabetes mellitus without complication (HCC)    type 2   Esophageal reflux    GERD (gastroesophageal reflux disease)    HLD (hyperlipidemia)    Hypokalemia    Myocardial infarction  [facility])    Other specified forms of chronic ischemic heart disease    echo 07/26/2008 EfF 30% with perapical HK   Paralytic ileus ([place])    Pneumonia    x 1   Unspecified essential hypertension      Past Surgical History:  Procedure Laterality Date   ABDOMINAL HYSTERECTOMY     BILATERAL SALPINGOOPHORECTOMY     CARDIAC CATHETERIZATION  March 2010   2 cardiac stent placement in March 2010.    CARDIAC DEFIBRILLATOR PLACEMENT  december 2010   St. Jude   CHOLECYSTECTOMY     27 years ago   IMPLANTABLE CARDIOVERTER DEFIBRILLATOR GENERATOR CHANGE N/A 05/16/2013   Generator change (SJM Fortify Assura VR) for prior device malfunction (capacitors unable to charge)   left pelvic lymphadenectomy     by Dr. Marti Sleigh    TOTAL ABDOMINAL HYSTERECTOMY W/ BILATERAL SALPINGOOPHORECTOMY     WISDOM TOOTH EXTRACTION      Current Medications: Current Meds  Medication Sig   atorvastatin (LIPITOR) 40 MG tablet TAKE ONE TABLET BY MOUTH EVERY DAY. NEEDAPPOINTMENT FOR REFILLS   Black Cohosh 40 MG CAPS Take 40 mg by mouth 2 (two) times daily.   carvedilol (COREG) 6.25 MG tablet TAKE ONE TABLET BY MOUTH 2 TIMES A DAY. NEED APPOINTMENT FOR REFILLS   Cholecalciferol (VITAMIN D3) 125 MCG (5000 UT) CHEW Chew 1 tablet by mouth daily.   digoxin (LANOXIN) 0.125 MG tablet TAKE ONE TABLET BY MOUTH EVERY DAY   FARXIGA 10 MG TABS tablet Take 10 mg by mouth daily.   furosemide (LASIX) 20 MG  tablet Take 1 tablet (20 mg total) by mouth daily.   glipiZIDE (GLUCOTROL XL) 10 MG 24 hr tablet Take 1 tablet by mouth daily with breakfast.   hydrOXYzine (ATARAX/VISTARIL) 25 MG tablet Take 25 mg by mouth every 8 (eight) hours as needed for anxiety.    ibuprofen (ADVIL) 800 MG tablet Take 800 mg by mouth 3 (three) times daily.   insulin degludec (TRESIBA FLEXTOUCH) 100 UNIT/ML FlexTouch Pen inject 32 units   Insulin Pen Needle 32G X 6 MM MISC 1 Dose by Does not apply route once a week.   nitroGLYCERIN (NITROSTAT) 0.4 MG SL tablet Place 1 tablet (0.4 mg total) under the tongue every 5 (five) minutes as needed for chest pain (up to 3 doses MAX).   oxyCODONE-acetaminophen (PERCOCET) 10-325 MG per tablet Take 1 tablet  by mouth every 6 (six) hours as needed for pain.    pantoprazole (PROTONIX) 40 MG tablet Take 40 mg by mouth daily.   pioglitazone (ACTOS) 30 MG tablet Take 1 tablet by mouth daily.   pregabalin (LYRICA) 75 MG capsule Take 75 mg by mouth 2 (two) times daily.   promethazine (PHENERGAN) 25 MG tablet Take 25 mg by mouth 2 (two) times daily as needed.   Semaglutide,0.25 or 0.'5MG'$ /DOS, (OZEMPIC, 0.25 OR 0.5 MG/DOSE,) 2 MG/1.5ML SOPN Inject 0.25 mg into the skin once a week.   tiZANidine (ZANAFLEX) 4 MG tablet Take 4 mg by mouth every 6 (six) hours as needed for muscle spasms.   Vitamin D, Ergocalciferol, (DRISDOL) 50000 units CAPS capsule Take 50,000 Units by mouth 2 (two) times a week.     Allergies:   Duloxetine hcl and Penicillins   Social History   Socioeconomic History   Marital status: Single    Spouse name: Not on file   Number of children: Not on file   Years of education: Not on file   Highest education level: Not on file  Occupational History   Occupation: Glass blower/designer in Cabin crew: [employer]  Tobacco Use   Smoking status: Former    Packs/day: 0.50    Types: Cigarettes   Smokeless tobacco: Never   Tobacco comments:    used to smoke 1/2 ppd but has not smoked since discharge.   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 Lives in [place] with a roommate.    Social Determinants of Health   Financial Resource Strain: Not on file  Food Insecurity: Not on file  Transportation Needs: Not on file  Physical Activity: Not on file  Stress: Not on file  Social Connections: Not on file     Family History: The patient's family history includes Breast cancer in her paternal aunt; CAD in her mother; Cancer in her father; Kidney cancer  in her paternal aunt; Other (age of onset: 55) in her father; Throat cancer in her paternal uncle.  ROS:   Please see the history of present illness.    All other systems reviewed and are negative.  EKGs/Labs/Other Studies Reviewed:    EKG:  EKG is ordered today.  The ekg ordered today demonstrates normal sinus rhythm 75 bpm, low voltage QRS, age-indeterminate anteroseptal infarct  Recent Labs: 02/09/2021: BUN  20; Creatinine, Ser 1.39; Potassium 4.0; Sodium 140  Recent Lipid Panel    Component Value Date/Time   CHOL 117 04/04/2010 0935   TRIG 151.0 (H) 04/04/2010 0935   HDL 31.70 (L) 04/04/2010 0935   CHOLHDL 4 04/04/2010 0935   VLDL 30.2 04/04/2010 0935   LDLCALC 55 04/04/2010 0935   LDLDIRECT 71.4 09/17/2009 1044     Risk Assessment/Calculations:           Physical Exam:    VS:  BP 108/62   Pulse 75   Ht 5' 3.5" (1.613 m)   Wt 179 lb 12.8 oz (81.6 kg)   SpO2 97%   BMI 31.35 kg/m     Wt Readings from Last 3 Encounters:  10/06/21 179 lb 12.8 oz (81.6 kg)  02/09/21 174 lb 3.2 oz (79 kg)  04/19/20 185 lb (83.9 kg)     GEN:  Well nourished, well developed in no acute distress HEENT: Normal NECK: No JVD; No carotid bruits LYMPHATICS: No lymphadenopathy CARDIAC: RRR, no murmurs, rubs, gallops RESPIRATORY:  Clear to auscultation without rales, wheezing or rhonchi  ABDOMEN: Soft, non-tender, non-distended MUSCULOSKELETAL:  No edema; No deformity  SKIN: Warm and dry NEUROLOGIC:  Alert and oriented x 3 PSYCHIATRIC:  Normal affect   ASSESSMENT:    1. Chronic systolic congestive heart failure ([place])   2. Coronary artery disease involving native coronary artery of native heart without angina pectoris   3. Uncontrolled type 2 diabetes mellitus with hyperglycemia ([place])   4. Implantable cardioverter-defibrillator (ICD) in situ    PLAN:    In order of problems listed above:  The patient appears clinically stable on her current medical program which includes Farxiga,  carvedilol, digoxin.  She has not been able to tolerate an ACE or ARB because of symptomatic hypotension.  She was on these medications in the past but had marked hypotension with symptoms of dizziness and near syncope. Remote history of scad with severe resultant ischemic cardiomyopathy no further events. Managed by her primary physician on oral hypoglycemics and insulin Followed by our EP service, no interval ICD discharges  The patient appears clinically stable and will return in 6 months for follow-up with me or an APP.  Her most recent labs are reviewed.  She had an echocardiogram performed by her primary care physician and we have sent for a copy of the report.    Medication Adjustments/Labs and Tests Ordered: Current medicines are reviewed at length with the patient today.  Concerns regarding medicines are outlined above.  Orders Placed This Encounter  Procedures   EKG 12-Lead   No orders of the defined types were placed in this encounter.   Patient Instructions  Medication Instructions:  Your physician recommends that you continue on your current medications as directed. Please refer to the Current Medication list given to you today.  *If you need a refill on your cardiac medications before your next appointment, please call your pharmacy*   Lab Work: NONE If you have labs (blood work) drawn today and your tests are completely normal, you will receive your results only by: [place] (if you have MyChart) OR A paper copy in the mail If you have any lab test that is abnormal or we need to change your treatment, we will call you to review the results.   Testing/Procedures: NONE (will get ECHO results from PCP)   Follow-Up: At [facility], you and your health needs are our priority.  As part of our continuing mission  to provide you with exceptional heart care, we have created designated Provider Care Teams.  These Care Teams include your primary Cardiologist  (physician) and Advanced Practice Providers (APPs -  Physician Assistants and Nurse Practitioners) who all work together to provide you with the care you need, when you need it.  We recommend signing up for the patient portal called "MyChart".  Sign up information is provided on this After Visit Summary.  MyChart is used to connect with patients for Virtual Visits (Telemedicine).  Patients are able to view lab/test results, encounter notes, upcoming appointments, etc.  Non-urgent messages can be sent to your provider as well.   To learn more about what you can do with MyChart, go to [email or web address].    Your next appointment:   6 month(s)  The format for your next appointment:   In Person  Provider:   Sherren Mocha, MD       Important Information About Sugar         Signed, Sherren Mocha, MD  10/16/2021 3:56 PM    [place]

## 2021-11-01 ENCOUNTER — Other Ambulatory Visit: Payer: Self-pay | Admitting: Cardiovascular Disease

## 2021-11-02 ENCOUNTER — Ambulatory Visit (INDEPENDENT_AMBULATORY_CARE_PROVIDER_SITE_OTHER): Payer: Medicare HMO

## 2021-11-02 DIAGNOSIS — I255 Ischemic cardiomyopathy: Secondary | ICD-10-CM

## 2021-11-04 LAB — CUP PACEART REMOTE DEVICE CHECK
Battery Remaining Longevity: 34 mo
Battery Remaining Percentage: 31 %
Battery Voltage: 2.9 V
Brady Statistic RV Percent Paced: 0 %
Date Time Interrogation Session: 20230622211729
HighPow Impedance: 60 Ohm
HighPow Impedance: 60 Ohm
Implantable Lead Implant Date: 20101228
Implantable Lead Location: 753860
Implantable Lead Model: 7122
Implantable Pulse Generator Implant Date: 20150102
Lead Channel Impedance Value: 690 Ohm
Lead Channel Pacing Threshold Amplitude: 1 V
Lead Channel Pacing Threshold Pulse Width: 0.5 ms
Lead Channel Sensing Intrinsic Amplitude: 12 mV
Lead Channel Setting Pacing Amplitude: 2.5 V
Lead Channel Setting Pacing Pulse Width: 0.5 ms
Lead Channel Setting Sensing Sensitivity: 0.5 mV
Pulse Gen Serial Number: 7152745

## 2021-11-16 NOTE — Progress Notes (Signed)
Remote ICD transmission.   

## 2022-02-01 ENCOUNTER — Ambulatory Visit (INDEPENDENT_AMBULATORY_CARE_PROVIDER_SITE_OTHER): Payer: Medicare HMO

## 2022-02-01 DIAGNOSIS — I255 Ischemic cardiomyopathy: Secondary | ICD-10-CM | POA: Diagnosis not present

## 2022-02-06 LAB — CUP PACEART REMOTE DEVICE CHECK
Battery Remaining Longevity: 31 mo
Battery Remaining Percentage: 29 %
Battery Voltage: 2.9 V
Brady Statistic RV Percent Paced: 0 %
Date Time Interrogation Session: 20230922133439
HighPow Impedance: 53 Ohm
HighPow Impedance: 53 Ohm
Implantable Lead Implant Date: 20101228
Implantable Lead Location: 753860
Implantable Lead Model: 7122
Implantable Pulse Generator Implant Date: 20150102
Lead Channel Impedance Value: 630 Ohm
Lead Channel Pacing Threshold Amplitude: 1 V
Lead Channel Pacing Threshold Pulse Width: 0.5 ms
Lead Channel Sensing Intrinsic Amplitude: 11.7 mV
Lead Channel Setting Pacing Amplitude: 2.5 V
Lead Channel Setting Pacing Pulse Width: 0.5 ms
Lead Channel Setting Sensing Sensitivity: 0.5 mV
Pulse Gen Serial Number: 7152745

## 2022-02-14 NOTE — Progress Notes (Signed)
Remote ICD transmission.   

## 2022-04-14 ENCOUNTER — Ambulatory Visit: Payer: Medicare HMO | Attending: Cardiovascular Disease | Admitting: Cardiovascular Disease

## 2022-04-14 ENCOUNTER — Encounter: Payer: Self-pay | Admitting: Cardiovascular Disease

## 2022-04-14 VITALS — BP 90/60 | HR 81 | Ht 64.0 in | Wt 191.2 lb

## 2022-04-14 DIAGNOSIS — Z79899 Other long term (current) drug therapy: Secondary | ICD-10-CM | POA: Diagnosis not present

## 2022-04-14 DIAGNOSIS — I10 Essential (primary) hypertension: Secondary | ICD-10-CM

## 2022-04-14 DIAGNOSIS — I5022 Chronic systolic (congestive) heart failure: Secondary | ICD-10-CM

## 2022-04-14 DIAGNOSIS — I2510 Atherosclerotic heart disease of native coronary artery without angina pectoris: Secondary | ICD-10-CM

## 2022-04-14 MED ORDER — FUROSEMIDE 20 MG PO TABS: 60.0000 mg | ORAL_TABLET | Freq: Every day | ORAL | 3 refills | Status: DC

## 2022-04-14 MED ORDER — ATORVASTATIN CALCIUM 40 MG PO TABS: ORAL_TABLET | ORAL | 3 refills | Status: AC

## 2022-04-14 MED ORDER — CARVEDILOL 6.25 MG PO TABS: ORAL_TABLET | ORAL | 3 refills | Status: DC

## 2022-04-14 MED ORDER — DIGOXIN 125 MCG PO TABS: 125.0000 ug | ORAL_TABLET | Freq: Every day | ORAL | 3 refills | Status: AC

## 2022-04-14 NOTE — Progress Notes (Signed)
Cardiology Office Note:    Date:  04/14/2022   ID:  Emily Mays, DOB 09-02-1960, MRN [id number]  PCP:  Bonnita Nasuti, MD   [facility] Cardiologist:  Sherren Mocha, MD Electrophysiologist:  Thompson Grayer, MD     Referring MD: Bonnita Nasuti, MD   Chief Complaint  Patient presents with   Coronary Artery Disease    History of Present Illness:    Emily Mays is a 61 y.o. female with a hx of: Coronary artery disease  S/p SCAD (LAD) in 2010 >> s/p extensive stenting of LAD and LCx Systolic CHF Ischemic CM cMRI 02/2019: EF 35 Echocardiogram 8/16: EF 35-40 S/p ICD  Hypertension  GERD  The patient is here alone today.  She has been having some problems with leg swelling over the past few months.  She saw her nephrologist recently and her furosemide was increased for a few days.  She had some improvement but has continued to have leg swelling.  She has intermittent shortness of breath but no change from baseline.  No orthopnea or PND.  No chest pain or pressure.  The patient is compliant with her medications.  She denies any lightheadedness or presyncope.  She runs a low blood pressure much of the time and this is her baseline.  Past Medical History:  Diagnosis Date   AICD (automatic cardioverter/defibrillator) present 04/2009   St Jude model [phone]   Anemia    CAD (coronary artery disease)    spontaneous LAD dissection presenting with anterior STEMI. s/p MI 07/24/08 with PCI Endeavor DES to Prox. LAD and BMA to prox. LCX.. relook cath 07/28/08 showing cont. patency of LAD to LCX.   Cancer [facility])    uterine cancer   Chronic systolic HF (heart failure) (HCC)    COPD (chronic obstructive pulmonary disease) (HCC)    no inhaler   DDD (degenerative disc disease)    Diabetes mellitus without complication (HCC)    type 2   Esophageal reflux    GERD (gastroesophageal reflux disease)    HLD (hyperlipidemia)    Hypokalemia    Myocardial infarction [facility]     Other specified forms of chronic ischemic heart disease    echo 07/26/2008 EfF 30% with perapical HK   Paralytic ileus ([place])    Pneumonia    x 1   Unspecified essential hypertension     Past Surgical History:  Procedure Laterality Date   ABDOMINAL HYSTERECTOMY     BILATERAL SALPINGOOPHORECTOMY     CARDIAC CATHETERIZATION  March 2010   2 cardiac stent placement in March 2010.    CARDIAC DEFIBRILLATOR PLACEMENT  december 2010   St. Jude   CHOLECYSTECTOMY     27 years ago   IMPLANTABLE CARDIOVERTER DEFIBRILLATOR GENERATOR CHANGE N/A 05/16/2013   Generator change (SJM Fortify Assura VR) for prior device malfunction (capacitors unable to charge)   left pelvic lymphadenectomy     by Dr. Marti Sleigh    TOTAL ABDOMINAL HYSTERECTOMY W/ BILATERAL SALPINGOOPHORECTOMY     WISDOM TOOTH EXTRACTION      Current Medications: Current Meds  Medication Sig   aspirin 81 MG chewable tablet 1 tablet Orally Once a day   Black Cohosh 40 MG CAPS Take 40 mg by mouth 2 (two) times daily.   Blood Glucose Monitoring Suppl (PHARMACIST CHOICE AUTOCODE SYS) w/Device KIT Check BG Once a day (DX: E11.65)   Cholecalciferol (VITAMIN D3) 125 MCG (5000 UT) CHEW Chew 1 tablet by mouth daily.  clobetasol cream (TEMOVATE) 0.05 %    Cyanocobalamin (VITAMIN B12) 1000 MCG TBCR 1 tablet Orally Once a day for 30 day(s)   FARXIGA 10 MG TABS tablet Take 10 mg by mouth daily.   furosemide (LASIX) 20 MG tablet Take 3 tablets (60 mg total) by mouth daily.   glipiZIDE (GLUCOTROL XL) 10 MG 24 hr tablet Take 1 tablet by mouth daily with breakfast.   hydrOXYzine (ATARAX/VISTARIL) 25 MG tablet Take 25 mg by mouth every 8 (eight) hours as needed for anxiety.    ibuprofen (ADVIL) 800 MG tablet Take 800 mg by mouth 3 (three) times daily.   insulin degludec (TRESIBA FLEXTOUCH) 100 UNIT/ML FlexTouch Pen inject 32 units   Insulin Pen Needle 32G X 6 MM MISC 1 Dose by Does not apply route once a week.   nitroGLYCERIN  (NITROSTAT) 0.4 MG SL tablet Place 1 tablet (0.4 mg total) under the tongue every 5 (five) minutes as needed for chest pain (up to 3 doses MAX).   oxyCODONE-acetaminophen (PERCOCET) 10-325 MG per tablet Take 1 tablet by mouth every 6 (six) hours as needed for pain.    pantoprazole (PROTONIX) 40 MG tablet Take 40 mg by mouth daily.   pioglitazone (ACTOS) 30 MG tablet Take 1 tablet by mouth daily.   potassium chloride SA (KLOR-CON M) 20 MEQ tablet Take 20 mEq by mouth daily.   pregabalin (LYRICA) 75 MG capsule Take 75 mg by mouth 2 (two) times daily.   promethazine (PHENERGAN) 25 MG tablet Take 25 mg by mouth 2 (two) times daily as needed.   Semaglutide,0.25 or 0.5MG/DOS, (OZEMPIC, 0.25 OR 0.5 MG/DOSE,) 2 MG/1.5ML SOPN Inject 0.25 mg into the skin once a week.   tiZANidine (ZANAFLEX) 4 MG tablet Take 4 mg by mouth every 6 (six) hours as needed for muscle spasms.   Vitamin D, Ergocalciferol, (DRISDOL) 50000 units CAPS capsule Take 50,000 Units by mouth 2 (two) times a week.   [DISCONTINUED] atorvastatin (LIPITOR) 40 MG tablet TAKE ONE TABLET BY MOUTH EVERY DAY. NEEDAPPOINTMENT FOR REFILLS   [DISCONTINUED] carvedilol (COREG) 6.25 MG tablet TAKE ONE TABLET BY MOUTH 2 TIMES A DAY. NEED APPOINTMENT FOR REFILLS   [DISCONTINUED] digoxin (LANOXIN) 0.125 MG tablet TAKE ONE TABLET BY MOUTH EVERY DAY   [DISCONTINUED] furosemide (LASIX) 20 MG tablet Take 1 tablet (20 mg total) by mouth daily.   [DISCONTINUED] furosemide (LASIX) 40 MG tablet Take 40 mg by mouth daily.     Allergies:   Duloxetine hcl and Penicillins   Social History   Socioeconomic History   Marital status: Single    Spouse name: Not on file   Number of children: Not on file   Years of education: Not on file   Highest education level: Not on file  Occupational History   Occupation: Glass blower/designer in Cabin crew: [employer]  Tobacco Use   Smoking status: Former    Packs/day: 0.50    Types: Cigarettes   Smokeless  tobacco: Never   Tobacco comments:    used to smoke 1/2 ppd but has not smoked since discharge.   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 Lives in [place] with a roommate.  Social Determinants of Health   Financial Resource Strain: Not on file  Food Insecurity: Not on file  Transportation Needs: Not on file  Physical Activity: Not on file  Stress: Not on file  Social Connections: Not on file     Family History: The patient's family history includes Breast cancer in her paternal aunt; CAD in her mother; Cancer in her father; Kidney cancer in her paternal aunt; Other (age of onset: 52) in her father; Throat cancer in her paternal uncle.  ROS:   Please see the history of present illness.    All other systems reviewed and are negative.  EKGs/Labs/Other Studies Reviewed:    The following studies were reviewed today: Last echo performed at another facility is reviewed from 09/24/2021.  LVEF is 40 to 45%.  RV function is normal.  There is no significant valvular disease.  EKG:  EKG is not ordered today.    Recent Labs: No results found for requested labs within last 365 days.  Recent Lipid Panel    Component Value Date/Time   CHOL 117 04/04/2010 0935   TRIG 151.0 (H) 04/04/2010 0935   HDL 31.70 (L) 04/04/2010 0935   CHOLHDL 4 04/04/2010 0935   VLDL 30.2 04/04/2010 0935   LDLCALC 55 04/04/2010 0935   LDLDIRECT 71.4 09/17/2009 1044     Risk Assessment/Calculations:                Physical Exam:    VS:  BP 90/60 Comment: Right arm 90/60  Pulse 81   Ht _0  (1.626 m)   Wt 191 lb 3.2 oz (86.7 kg)   SpO2 97%   BMI 32.82 kg/m     Wt Readings from Last 3 Encounters:  04/14/22 191 lb 3.2 oz (86.7 kg)  10/06/21 179 lb 12.8 oz (81.6 kg)   02/09/21 174 lb 3.2 oz (79 kg)     GEN:  Well nourished, well developed in no acute distress HEENT: Normal NECK: No JVD; No carotid bruits LYMPHATICS: No lymphadenopathy CARDIAC: RRR, no murmurs, rubs, gallops RESPIRATORY:  Clear to auscultation without rales, wheezing or rhonchi  ABDOMEN: Soft, non-tender, non-distended MUSCULOSKELETAL: 1+ bilateral pretibial edema; No deformity  SKIN: Warm and dry NEUROLOGIC:  Alert and oriented x 3 PSYCHIATRIC:  Normal affect   ASSESSMENT:    1. Chronic systolic congestive heart failure ([place])   2. Coronary artery disease involving native coronary artery of native heart without angina pectoris   3. Essential hypertension   4. Medication management    PLAN:    In order of problems listed above:  The patient's medical regimen includes carvedilol, Lanoxin, Farxiga.  She has not been able to tolerate an ACE/ARB.  I am going to increase her furosemide to 60 mg daily to treat the signs of volume excess with lower extremity edema.  She will have a metabolic panel checked in 3 to 4 weeks. Stable without symptoms of angina.  Remote history of coronary dissection.  Continue current medical therapy. Blood pressure remains in the low normal range.  She is asymptomatic.  Continue current management.  Unable to tolerate ACE/ARB secondary to low blood pressure. As above, furosemide increased to 60 mg daily today.  We will repeat labs in 3 to 4 weeks.  I also discussed review of her diabetes regimen with her primary care physician.  It may be best to seek an alternative to Actos considering her peripheral edema.    Medication Adjustments/Labs and Tests Ordered: Current medicines are reviewed  at length with the patient today.  Concerns regarding medicines are outlined above.  Orders Placed This Encounter  Procedures   Basic metabolic panel   Meds ordered this encounter  Medications   atorvastatin (LIPITOR) 40 MG tablet    Sig: TAKE ONE TABLET BY MOUTH  EVERY DAY    Dispense:  90 tablet    Refill:  3   carvedilol (COREG) 6.25 MG tablet    Sig: TAKE ONE TABLET BY MOUTH 2 TIMES A DAY    Dispense:  180 tablet    Refill:  3   furosemide (LASIX) 20 MG tablet    Sig: Take 3 tablets (60 mg total) by mouth daily.    Dispense:  270 tablet    Refill:  3    Dose INCREASE   digoxin (LANOXIN) 0.125 MG tablet    Sig: Take 1 tablet (125 mcg total) by mouth daily.    Dispense:  90 tablet    Refill:  3    Patient Instructions  Medication Instructions:  INCREASE Furosemide to 80m daily *If you need a refill on your cardiac medications before your next appointment, please call your pharmacy*   Lab Work: BMET in 3-4 weeks If you have labs (blood work) drawn today and your tests are completely normal, you will receive your results only by: [place](if you have MyChart) OR A paper copy in the mail If you have any lab test that is abnormal or we need to change your treatment, we will call you to review the results.   Testing/Procedures: NONE   Follow-Up: [facility] and your health needs are our priority.  As part of our continuing mission to provide you with exceptional heart care, we have created designated Provider Care Teams.  These Care Teams include your primary Cardiologist (physician) and Advanced Practice Providers (APPs -  Physician Assistants and Nurse Practitioners) who all work together to provide you with the care you need, when you need it.  We recommend signing up for the patient portal called "MyChart".  Sign up information is provided on this After Visit Summary.  MyChart is used to connect with patients for Virtual Visits (Telemedicine).  Patients are able to view lab/test results, encounter notes, upcoming appointments, etc.  Non-urgent messages can be sent to your provider as well.   To learn more about what you can do with MyChart, go to [email or web address]    Your next appointment:   6  month(s)  The format for your next appointment:   In Person  Provider:   TNicholes Rough PA-C, DMelina Copa PA-C, EAmbrose Pancoast NP, MErmalinda Barrios PA-C, MChristen Bame NP, or SRichardson Dopp PA-C     Then, MSherren Mocha MD will plan to see you again in 1 year(s).      Important Information About Sugar         Signed, MSherren Mocha MD  04/14/2022 1:30 PM    CMariano Colon

## 2022-04-14 NOTE — Patient Instructions (Signed)
Medication Instructions:  INCREASE Furosemide to '60mg'$  daily *If you need a refill on your cardiac medications before your next appointment, please call your pharmacy*   Lab Work: BMET in 3-4 weeks If you have labs (blood work) drawn today and your tests are completely normal, you will receive your results only by: [place] (if you have MyChart) OR A paper copy in the mail If you have any lab test that is abnormal or we need to change your treatment, we will call you to review the results.   Testing/Procedures: NONE   Follow-Up: At [facility], you and your health needs are our priority.  As part of our continuing mission to provide you with exceptional heart care, we have created designated Provider Care Teams.  These Care Teams include your primary Cardiologist (physician) and Advanced Practice Providers (APPs -  Physician Assistants and Nurse Practitioners) who all work together to provide you with the care you need, when you need it.  We recommend signing up for the patient portal called "MyChart".  Sign up information is provided on this After Visit Summary.  MyChart is used to connect with patients for Virtual Visits (Telemedicine).  Patients are able to view lab/test results, encounter notes, upcoming appointments, etc.  Non-urgent messages can be sent to your provider as well.   To learn more about what you can do with MyChart, go to [email or web address].    Your next appointment:   6 month(s)  The format for your next appointment:   In Person  Provider:   Nicholes Rough, PA-C, Melina Copa, PA-C, Ambrose Pancoast, NP, Ermalinda Barrios, PA-C, Christen Bame, NP, or Richardson Dopp, PA-C     Then, Sherren Mocha, MD will plan to see you again in 1 year(s).      Important Information About Sugar

## 2022-04-28 ENCOUNTER — Ambulatory Visit: Payer: Medicare HMO | Attending: Cardiovascular Disease

## 2022-04-28 DIAGNOSIS — I2510 Atherosclerotic heart disease of native coronary artery without angina pectoris: Secondary | ICD-10-CM | POA: Diagnosis not present

## 2022-04-28 DIAGNOSIS — I10 Essential (primary) hypertension: Secondary | ICD-10-CM | POA: Diagnosis not present

## 2022-04-28 DIAGNOSIS — Z79899 Other long term (current) drug therapy: Secondary | ICD-10-CM

## 2022-04-28 DIAGNOSIS — I5022 Chronic systolic (congestive) heart failure: Secondary | ICD-10-CM

## 2022-04-28 LAB — BASIC METABOLIC PANEL
BUN/Creatinine Ratio: 11 (ref 9–23)
BUN: 15 mg/dL (ref 6–24)
CO2: 26 mmol/L (ref 20–29)
Calcium: 9.1 mg/dL (ref 8.7–10.2)
Chloride: 103 mmol/L (ref 96–106)
Creatinine, Ser: 1.33 mg/dL — ABNORMAL HIGH (ref 0.57–1.00)
Glucose: 171 mg/dL — ABNORMAL HIGH (ref 70–99)
Potassium: 3.2 mmol/L — ABNORMAL LOW (ref 3.5–5.2)
Sodium: 145 mmol/L — ABNORMAL HIGH (ref 134–144)
eGFR: 46 mL/min/{1.73_m2} — ABNORMAL LOW (ref >59)

## 2022-05-03 ENCOUNTER — Ambulatory Visit (INDEPENDENT_AMBULATORY_CARE_PROVIDER_SITE_OTHER): Payer: Medicare HMO

## 2022-05-03 ENCOUNTER — Other Ambulatory Visit: Payer: Medicare HMO

## 2022-05-03 DIAGNOSIS — I255 Ischemic cardiomyopathy: Secondary | ICD-10-CM | POA: Diagnosis not present

## 2022-05-05 LAB — CUP PACEART REMOTE DEVICE CHECK
Battery Remaining Longevity: 32 mo
Battery Remaining Percentage: 29 %
Battery Voltage: 2.89 V
Brady Statistic RV Percent Paced: 0 %
Date Time Interrogation Session: 20231221193541
HighPow Impedance: 61 Ohm
HighPow Impedance: 61 Ohm
Implantable Lead Connection Status: 753985
Implantable Lead Implant Date: 20101228
Implantable Lead Location: 753860
Implantable Lead Model: 7122
Implantable Pulse Generator Implant Date: 20150102
Lead Channel Impedance Value: 640 Ohm
Lead Channel Pacing Threshold Amplitude: 1 V
Lead Channel Pacing Threshold Pulse Width: 0.5 ms
Lead Channel Sensing Intrinsic Amplitude: 12 mV
Lead Channel Setting Pacing Amplitude: 2.5 V
Lead Channel Setting Pacing Pulse Width: 0.5 ms
Lead Channel Setting Sensing Sensitivity: 0.5 mV
Pulse Gen Serial Number: 7152745
Zone Setting Status: 755011

## 2022-05-11 ENCOUNTER — Telehealth: Payer: Self-pay | Admitting: *Deleted

## 2022-05-11 DIAGNOSIS — Z79899 Other long term (current) drug therapy: Secondary | ICD-10-CM

## 2022-05-11 DIAGNOSIS — I2510 Atherosclerotic heart disease of native coronary artery without angina pectoris: Secondary | ICD-10-CM

## 2022-05-11 DIAGNOSIS — I5042 Chronic combined systolic (congestive) and diastolic (congestive) heart failure: Secondary | ICD-10-CM

## 2022-05-11 MED ORDER — POTASSIUM CHLORIDE CRYS ER 20 MEQ PO TBCR: 20.0000 meq | EXTENDED_RELEASE_TABLET | Freq: Two times a day (BID) | ORAL | 3 refills | Status: AC

## 2022-05-11 MED ORDER — NITROGLYCERIN 0.4 MG SL SUBL: 0.4000 mg | SUBLINGUAL_TABLET | SUBLINGUAL | 6 refills | Status: DC | PRN

## 2022-05-11 NOTE — Telephone Encounter (Signed)
-----   Message from Sherren Mocha, MD sent at 05/09/2022  9:03 AM EST ----- Reviewed patient's medications. Would increase KDur from 20 meq daily to 20 meq BID. Repeat lab one month. Would try to minimize ibuprofen as much as possible. thx

## 2022-05-11 NOTE — Telephone Encounter (Signed)
Patient notified. Prescription sent to [facility].  She will come in for BMP on 06/09/22.  Patient reports she has stopped taking Ibuprofen.  Refill for NTG also sent to patient's pharmacy per her request.

## 2022-05-12 DIAGNOSIS — I10 Essential (primary) hypertension: Secondary | ICD-10-CM | POA: Diagnosis not present

## 2022-05-13 DIAGNOSIS — I5022 Chronic systolic (congestive) heart failure: Secondary | ICD-10-CM | POA: Diagnosis not present

## 2022-05-13 DIAGNOSIS — E1165 Type 2 diabetes mellitus with hyperglycemia: Secondary | ICD-10-CM | POA: Diagnosis not present

## 2022-05-13 DIAGNOSIS — I10 Essential (primary) hypertension: Secondary | ICD-10-CM | POA: Diagnosis not present

## 2022-05-13 DIAGNOSIS — D518 Other vitamin B12 deficiency anemias: Secondary | ICD-10-CM | POA: Diagnosis not present

## 2022-05-13 DIAGNOSIS — E782 Mixed hyperlipidemia: Secondary | ICD-10-CM | POA: Diagnosis not present

## 2022-05-13 DIAGNOSIS — I255 Ischemic cardiomyopathy: Secondary | ICD-10-CM | POA: Diagnosis not present

## 2022-05-13 DIAGNOSIS — E559 Vitamin D deficiency, unspecified: Secondary | ICD-10-CM | POA: Diagnosis not present

## 2022-05-13 DIAGNOSIS — I2510 Atherosclerotic heart disease of native coronary artery without angina pectoris: Secondary | ICD-10-CM | POA: Diagnosis not present

## 2022-05-13 DIAGNOSIS — I5042 Chronic combined systolic (congestive) and diastolic (congestive) heart failure: Secondary | ICD-10-CM | POA: Diagnosis not present

## 2022-05-17 DIAGNOSIS — M5415 Radiculopathy, thoracolumbar region: Secondary | ICD-10-CM | POA: Diagnosis not present

## 2022-05-17 DIAGNOSIS — E1165 Type 2 diabetes mellitus with hyperglycemia: Secondary | ICD-10-CM | POA: Diagnosis not present

## 2022-05-17 DIAGNOSIS — Z79899 Other long term (current) drug therapy: Secondary | ICD-10-CM | POA: Diagnosis not present

## 2022-05-17 DIAGNOSIS — E782 Mixed hyperlipidemia: Secondary | ICD-10-CM | POA: Diagnosis not present

## 2022-05-17 DIAGNOSIS — Z5181 Encounter for therapeutic drug level monitoring: Secondary | ICD-10-CM | POA: Diagnosis not present

## 2022-05-17 DIAGNOSIS — E559 Vitamin D deficiency, unspecified: Secondary | ICD-10-CM | POA: Diagnosis not present

## 2022-05-17 DIAGNOSIS — F411 Generalized anxiety disorder: Secondary | ICD-10-CM | POA: Diagnosis not present

## 2022-05-17 DIAGNOSIS — D518 Other vitamin B12 deficiency anemias: Secondary | ICD-10-CM | POA: Diagnosis not present

## 2022-05-17 DIAGNOSIS — E038 Other specified hypothyroidism: Secondary | ICD-10-CM | POA: Diagnosis not present

## 2022-05-17 DIAGNOSIS — K219 Gastro-esophageal reflux disease without esophagitis: Secondary | ICD-10-CM | POA: Diagnosis not present

## 2022-05-17 DIAGNOSIS — I10 Essential (primary) hypertension: Secondary | ICD-10-CM | POA: Diagnosis not present

## 2022-05-23 DIAGNOSIS — E782 Mixed hyperlipidemia: Secondary | ICD-10-CM | POA: Diagnosis not present

## 2022-05-23 DIAGNOSIS — E1165 Type 2 diabetes mellitus with hyperglycemia: Secondary | ICD-10-CM | POA: Diagnosis not present

## 2022-05-29 NOTE — Progress Notes (Signed)
Remote ICD transmission.   

## 2022-06-07 DIAGNOSIS — F411 Generalized anxiety disorder: Secondary | ICD-10-CM | POA: Diagnosis not present

## 2022-06-08 DIAGNOSIS — I5042 Chronic combined systolic (congestive) and diastolic (congestive) heart failure: Secondary | ICD-10-CM | POA: Diagnosis not present

## 2022-06-08 DIAGNOSIS — I2510 Atherosclerotic heart disease of native coronary artery without angina pectoris: Secondary | ICD-10-CM | POA: Diagnosis not present

## 2022-06-08 DIAGNOSIS — I5022 Chronic systolic (congestive) heart failure: Secondary | ICD-10-CM | POA: Diagnosis not present

## 2022-06-08 DIAGNOSIS — E559 Vitamin D deficiency, unspecified: Secondary | ICD-10-CM | POA: Diagnosis not present

## 2022-06-08 DIAGNOSIS — E782 Mixed hyperlipidemia: Secondary | ICD-10-CM | POA: Diagnosis not present

## 2022-06-08 DIAGNOSIS — D518 Other vitamin B12 deficiency anemias: Secondary | ICD-10-CM | POA: Diagnosis not present

## 2022-06-08 DIAGNOSIS — I10 Essential (primary) hypertension: Secondary | ICD-10-CM | POA: Diagnosis not present

## 2022-06-08 DIAGNOSIS — I255 Ischemic cardiomyopathy: Secondary | ICD-10-CM | POA: Diagnosis not present

## 2022-06-08 DIAGNOSIS — E1165 Type 2 diabetes mellitus with hyperglycemia: Secondary | ICD-10-CM | POA: Diagnosis not present

## 2022-06-09 ENCOUNTER — Ambulatory Visit: Payer: Medicare HMO | Attending: Cardiovascular Disease

## 2022-06-09 DIAGNOSIS — Z79899 Other long term (current) drug therapy: Secondary | ICD-10-CM | POA: Diagnosis not present

## 2022-06-09 DIAGNOSIS — I5042 Chronic combined systolic (congestive) and diastolic (congestive) heart failure: Secondary | ICD-10-CM

## 2022-06-12 DIAGNOSIS — I509 Heart failure, unspecified: Secondary | ICD-10-CM | POA: Diagnosis not present

## 2022-06-12 DIAGNOSIS — I959 Hypotension, unspecified: Secondary | ICD-10-CM | POA: Diagnosis not present

## 2022-06-12 DIAGNOSIS — N2581 Secondary hyperparathyroidism of renal origin: Secondary | ICD-10-CM | POA: Diagnosis not present

## 2022-06-12 DIAGNOSIS — E876 Hypokalemia: Secondary | ICD-10-CM | POA: Diagnosis not present

## 2022-06-12 DIAGNOSIS — D631 Anemia in chronic kidney disease: Secondary | ICD-10-CM | POA: Diagnosis not present

## 2022-06-12 DIAGNOSIS — N183 Chronic kidney disease, stage 3 unspecified: Secondary | ICD-10-CM | POA: Diagnosis not present

## 2022-06-12 DIAGNOSIS — I10 Essential (primary) hypertension: Secondary | ICD-10-CM | POA: Diagnosis not present

## 2022-06-12 LAB — BASIC METABOLIC PANEL
BUN/Creatinine Ratio: 8 — ABNORMAL LOW (ref 9–23)
BUN: 8 mg/dL (ref 6–24)
CO2: 22 mmol/L (ref 20–29)
Calcium: 9.6 mg/dL (ref 8.7–10.2)
Chloride: 100 mmol/L (ref 96–106)
Creatinine, Ser: 0.99 mg/dL (ref 0.57–1.00)
Glucose: 351 mg/dL — ABNORMAL HIGH (ref 70–99)
Potassium: 3.9 mmol/L (ref 3.5–5.2)
Sodium: 139 mmol/L (ref 134–144)
eGFR: 66 mL/min/{1.73_m2} (ref >59)

## 2022-06-15 DIAGNOSIS — E782 Mixed hyperlipidemia: Secondary | ICD-10-CM | POA: Diagnosis not present

## 2022-06-15 DIAGNOSIS — M5415 Radiculopathy, thoracolumbar region: Secondary | ICD-10-CM | POA: Diagnosis not present

## 2022-06-15 DIAGNOSIS — I10 Essential (primary) hypertension: Secondary | ICD-10-CM | POA: Diagnosis not present

## 2022-06-15 DIAGNOSIS — E1165 Type 2 diabetes mellitus with hyperglycemia: Secondary | ICD-10-CM | POA: Diagnosis not present

## 2022-06-15 DIAGNOSIS — Z5181 Encounter for therapeutic drug level monitoring: Secondary | ICD-10-CM | POA: Diagnosis not present

## 2022-06-15 DIAGNOSIS — F411 Generalized anxiety disorder: Secondary | ICD-10-CM | POA: Diagnosis not present

## 2022-07-03 DIAGNOSIS — E782 Mixed hyperlipidemia: Secondary | ICD-10-CM | POA: Diagnosis not present

## 2022-07-03 DIAGNOSIS — I5042 Chronic combined systolic (congestive) and diastolic (congestive) heart failure: Secondary | ICD-10-CM | POA: Diagnosis not present

## 2022-07-03 DIAGNOSIS — I2510 Atherosclerotic heart disease of native coronary artery without angina pectoris: Secondary | ICD-10-CM | POA: Diagnosis not present

## 2022-07-03 DIAGNOSIS — I10 Essential (primary) hypertension: Secondary | ICD-10-CM | POA: Diagnosis not present

## 2022-07-03 DIAGNOSIS — I5022 Chronic systolic (congestive) heart failure: Secondary | ICD-10-CM | POA: Diagnosis not present

## 2022-07-03 DIAGNOSIS — D518 Other vitamin B12 deficiency anemias: Secondary | ICD-10-CM | POA: Diagnosis not present

## 2022-07-03 DIAGNOSIS — E1165 Type 2 diabetes mellitus with hyperglycemia: Secondary | ICD-10-CM | POA: Diagnosis not present

## 2022-07-03 DIAGNOSIS — I255 Ischemic cardiomyopathy: Secondary | ICD-10-CM | POA: Diagnosis not present

## 2022-07-03 DIAGNOSIS — E559 Vitamin D deficiency, unspecified: Secondary | ICD-10-CM | POA: Diagnosis not present

## 2022-07-05 DIAGNOSIS — M5415 Radiculopathy, thoracolumbar region: Secondary | ICD-10-CM | POA: Diagnosis not present

## 2022-07-05 DIAGNOSIS — E1165 Type 2 diabetes mellitus with hyperglycemia: Secondary | ICD-10-CM | POA: Diagnosis not present

## 2022-07-05 DIAGNOSIS — M5136 Other intervertebral disc degeneration, lumbar region: Secondary | ICD-10-CM | POA: Diagnosis not present

## 2022-07-05 DIAGNOSIS — I5022 Chronic systolic (congestive) heart failure: Secondary | ICD-10-CM | POA: Diagnosis not present

## 2022-07-05 DIAGNOSIS — G4700 Insomnia, unspecified: Secondary | ICD-10-CM | POA: Diagnosis not present

## 2022-07-13 DIAGNOSIS — I10 Essential (primary) hypertension: Secondary | ICD-10-CM | POA: Diagnosis not present

## 2022-07-17 DIAGNOSIS — E782 Mixed hyperlipidemia: Secondary | ICD-10-CM | POA: Diagnosis not present

## 2022-07-17 DIAGNOSIS — G9009 Other idiopathic peripheral autonomic neuropathy: Secondary | ICD-10-CM | POA: Diagnosis not present

## 2022-07-17 DIAGNOSIS — E1165 Type 2 diabetes mellitus with hyperglycemia: Secondary | ICD-10-CM | POA: Diagnosis not present

## 2022-07-17 DIAGNOSIS — I10 Essential (primary) hypertension: Secondary | ICD-10-CM | POA: Diagnosis not present

## 2022-07-17 DIAGNOSIS — M5415 Radiculopathy, thoracolumbar region: Secondary | ICD-10-CM | POA: Diagnosis not present

## 2022-07-17 DIAGNOSIS — I5022 Chronic systolic (congestive) heart failure: Secondary | ICD-10-CM | POA: Diagnosis not present

## 2022-07-17 DIAGNOSIS — Z0000 Encounter for general adult medical examination without abnormal findings: Secondary | ICD-10-CM | POA: Diagnosis not present

## 2022-07-17 DIAGNOSIS — Z5181 Encounter for therapeutic drug level monitoring: Secondary | ICD-10-CM | POA: Diagnosis not present

## 2022-07-17 DIAGNOSIS — E559 Vitamin D deficiency, unspecified: Secondary | ICD-10-CM | POA: Diagnosis not present

## 2022-07-17 DIAGNOSIS — I2510 Atherosclerotic heart disease of native coronary artery without angina pectoris: Secondary | ICD-10-CM | POA: Diagnosis not present

## 2022-07-25 DIAGNOSIS — F419 Anxiety disorder, unspecified: Secondary | ICD-10-CM | POA: Diagnosis not present

## 2022-08-02 ENCOUNTER — Ambulatory Visit (INDEPENDENT_AMBULATORY_CARE_PROVIDER_SITE_OTHER): Payer: Medicare HMO

## 2022-08-02 DIAGNOSIS — I255 Ischemic cardiomyopathy: Secondary | ICD-10-CM

## 2022-08-03 DIAGNOSIS — I10 Essential (primary) hypertension: Secondary | ICD-10-CM | POA: Diagnosis not present

## 2022-08-03 DIAGNOSIS — E1165 Type 2 diabetes mellitus with hyperglycemia: Secondary | ICD-10-CM | POA: Diagnosis not present

## 2022-08-03 DIAGNOSIS — E782 Mixed hyperlipidemia: Secondary | ICD-10-CM | POA: Diagnosis not present

## 2022-08-03 DIAGNOSIS — F411 Generalized anxiety disorder: Secondary | ICD-10-CM | POA: Diagnosis not present

## 2022-08-03 DIAGNOSIS — I2510 Atherosclerotic heart disease of native coronary artery without angina pectoris: Secondary | ICD-10-CM | POA: Diagnosis not present

## 2022-08-03 DIAGNOSIS — E559 Vitamin D deficiency, unspecified: Secondary | ICD-10-CM | POA: Diagnosis not present

## 2022-08-03 DIAGNOSIS — I255 Ischemic cardiomyopathy: Secondary | ICD-10-CM | POA: Diagnosis not present

## 2022-08-03 DIAGNOSIS — I5022 Chronic systolic (congestive) heart failure: Secondary | ICD-10-CM | POA: Diagnosis not present

## 2022-08-03 DIAGNOSIS — I5042 Chronic combined systolic (congestive) and diastolic (congestive) heart failure: Secondary | ICD-10-CM | POA: Diagnosis not present

## 2022-08-03 LAB — CUP PACEART REMOTE DEVICE CHECK
Battery Remaining Longevity: 31 mo
Battery Remaining Percentage: 29 %
Battery Voltage: 2.87 V
Brady Statistic RV Percent Paced: 0 %
Date Time Interrogation Session: 20240320175546
HighPow Impedance: 77 Ohm
HighPow Impedance: 77 Ohm
Implantable Lead Connection Status: 753985
Implantable Lead Implant Date: 20101228
Implantable Lead Location: 753860
Implantable Lead Model: 7122
Implantable Pulse Generator Implant Date: 20150102
Lead Channel Impedance Value: 760 Ohm
Lead Channel Pacing Threshold Amplitude: 1 V
Lead Channel Pacing Threshold Pulse Width: 0.5 ms
Lead Channel Sensing Intrinsic Amplitude: 12 mV
Lead Channel Setting Pacing Amplitude: 2.5 V
Lead Channel Setting Pacing Pulse Width: 0.5 ms
Lead Channel Setting Sensing Sensitivity: 0.5 mV
Pulse Gen Serial Number: 7152745
Zone Setting Status: 755011

## 2022-08-11 DIAGNOSIS — I10 Essential (primary) hypertension: Secondary | ICD-10-CM | POA: Diagnosis not present

## 2022-08-14 DIAGNOSIS — I10 Essential (primary) hypertension: Secondary | ICD-10-CM | POA: Diagnosis not present

## 2022-08-14 DIAGNOSIS — D518 Other vitamin B12 deficiency anemias: Secondary | ICD-10-CM | POA: Diagnosis not present

## 2022-08-14 DIAGNOSIS — E559 Vitamin D deficiency, unspecified: Secondary | ICD-10-CM | POA: Diagnosis not present

## 2022-08-14 DIAGNOSIS — Z5181 Encounter for therapeutic drug level monitoring: Secondary | ICD-10-CM | POA: Diagnosis not present

## 2022-08-14 DIAGNOSIS — E782 Mixed hyperlipidemia: Secondary | ICD-10-CM | POA: Diagnosis not present

## 2022-08-14 DIAGNOSIS — Z79899 Other long term (current) drug therapy: Secondary | ICD-10-CM | POA: Diagnosis not present

## 2022-08-14 DIAGNOSIS — E038 Other specified hypothyroidism: Secondary | ICD-10-CM | POA: Diagnosis not present

## 2022-08-14 DIAGNOSIS — E1165 Type 2 diabetes mellitus with hyperglycemia: Secondary | ICD-10-CM | POA: Diagnosis not present

## 2022-08-14 DIAGNOSIS — F411 Generalized anxiety disorder: Secondary | ICD-10-CM | POA: Diagnosis not present

## 2022-08-14 DIAGNOSIS — M5415 Radiculopathy, thoracolumbar region: Secondary | ICD-10-CM | POA: Diagnosis not present

## 2022-09-05 DIAGNOSIS — I255 Ischemic cardiomyopathy: Secondary | ICD-10-CM | POA: Diagnosis not present

## 2022-09-05 DIAGNOSIS — E1165 Type 2 diabetes mellitus with hyperglycemia: Secondary | ICD-10-CM | POA: Diagnosis not present

## 2022-09-05 DIAGNOSIS — E559 Vitamin D deficiency, unspecified: Secondary | ICD-10-CM | POA: Diagnosis not present

## 2022-09-05 DIAGNOSIS — I5042 Chronic combined systolic (congestive) and diastolic (congestive) heart failure: Secondary | ICD-10-CM | POA: Diagnosis not present

## 2022-09-05 DIAGNOSIS — I10 Essential (primary) hypertension: Secondary | ICD-10-CM | POA: Diagnosis not present

## 2022-09-05 DIAGNOSIS — I5022 Chronic systolic (congestive) heart failure: Secondary | ICD-10-CM | POA: Diagnosis not present

## 2022-09-05 DIAGNOSIS — E782 Mixed hyperlipidemia: Secondary | ICD-10-CM | POA: Diagnosis not present

## 2022-09-05 DIAGNOSIS — F411 Generalized anxiety disorder: Secondary | ICD-10-CM | POA: Diagnosis not present

## 2022-09-05 DIAGNOSIS — I2510 Atherosclerotic heart disease of native coronary artery without angina pectoris: Secondary | ICD-10-CM | POA: Diagnosis not present

## 2022-09-07 NOTE — Progress Notes (Signed)
Remote ICD transmission.   

## 2022-09-14 DIAGNOSIS — M5415 Radiculopathy, thoracolumbar region: Secondary | ICD-10-CM | POA: Diagnosis not present

## 2022-09-14 DIAGNOSIS — I2510 Atherosclerotic heart disease of native coronary artery without angina pectoris: Secondary | ICD-10-CM | POA: Diagnosis not present

## 2022-09-14 DIAGNOSIS — I5022 Chronic systolic (congestive) heart failure: Secondary | ICD-10-CM | POA: Diagnosis not present

## 2022-09-14 DIAGNOSIS — G9009 Other idiopathic peripheral autonomic neuropathy: Secondary | ICD-10-CM | POA: Diagnosis not present

## 2022-09-14 DIAGNOSIS — E559 Vitamin D deficiency, unspecified: Secondary | ICD-10-CM | POA: Diagnosis not present

## 2022-09-14 DIAGNOSIS — E782 Mixed hyperlipidemia: Secondary | ICD-10-CM | POA: Diagnosis not present

## 2022-09-14 DIAGNOSIS — F411 Generalized anxiety disorder: Secondary | ICD-10-CM | POA: Diagnosis not present

## 2022-09-14 DIAGNOSIS — E1165 Type 2 diabetes mellitus with hyperglycemia: Secondary | ICD-10-CM | POA: Diagnosis not present

## 2022-09-14 DIAGNOSIS — I10 Essential (primary) hypertension: Secondary | ICD-10-CM | POA: Diagnosis not present

## 2022-10-04 DIAGNOSIS — S46011A Strain of muscle(s) and tendon(s) of the rotator cuff of right shoulder, initial encounter: Secondary | ICD-10-CM | POA: Diagnosis not present

## 2022-10-11 DIAGNOSIS — E782 Mixed hyperlipidemia: Secondary | ICD-10-CM | POA: Diagnosis not present

## 2022-10-11 DIAGNOSIS — I5022 Chronic systolic (congestive) heart failure: Secondary | ICD-10-CM | POA: Diagnosis not present

## 2022-10-11 DIAGNOSIS — I5042 Chronic combined systolic (congestive) and diastolic (congestive) heart failure: Secondary | ICD-10-CM | POA: Diagnosis not present

## 2022-10-11 DIAGNOSIS — E559 Vitamin D deficiency, unspecified: Secondary | ICD-10-CM | POA: Diagnosis not present

## 2022-10-11 DIAGNOSIS — I10 Essential (primary) hypertension: Secondary | ICD-10-CM | POA: Diagnosis not present

## 2022-10-11 DIAGNOSIS — F411 Generalized anxiety disorder: Secondary | ICD-10-CM | POA: Diagnosis not present

## 2022-10-11 DIAGNOSIS — I2510 Atherosclerotic heart disease of native coronary artery without angina pectoris: Secondary | ICD-10-CM | POA: Diagnosis not present

## 2022-10-11 DIAGNOSIS — I255 Ischemic cardiomyopathy: Secondary | ICD-10-CM | POA: Diagnosis not present

## 2022-10-11 DIAGNOSIS — E1165 Type 2 diabetes mellitus with hyperglycemia: Secondary | ICD-10-CM | POA: Diagnosis not present

## 2022-10-12 DIAGNOSIS — I5022 Chronic systolic (congestive) heart failure: Secondary | ICD-10-CM | POA: Diagnosis not present

## 2022-10-12 DIAGNOSIS — G9009 Other idiopathic peripheral autonomic neuropathy: Secondary | ICD-10-CM | POA: Diagnosis not present

## 2022-10-12 DIAGNOSIS — I2510 Atherosclerotic heart disease of native coronary artery without angina pectoris: Secondary | ICD-10-CM | POA: Diagnosis not present

## 2022-10-12 DIAGNOSIS — Z5181 Encounter for therapeutic drug level monitoring: Secondary | ICD-10-CM | POA: Diagnosis not present

## 2022-10-12 DIAGNOSIS — I10 Essential (primary) hypertension: Secondary | ICD-10-CM | POA: Diagnosis not present

## 2022-10-12 DIAGNOSIS — R0602 Shortness of breath: Secondary | ICD-10-CM | POA: Diagnosis not present

## 2022-10-12 DIAGNOSIS — H6123 Impacted cerumen, bilateral: Secondary | ICD-10-CM | POA: Diagnosis not present

## 2022-10-12 DIAGNOSIS — E782 Mixed hyperlipidemia: Secondary | ICD-10-CM | POA: Diagnosis not present

## 2022-10-12 DIAGNOSIS — E1165 Type 2 diabetes mellitus with hyperglycemia: Secondary | ICD-10-CM | POA: Diagnosis not present

## 2022-10-13 DIAGNOSIS — R011 Cardiac murmur, unspecified: Secondary | ICD-10-CM | POA: Diagnosis not present

## 2023-04-21 ENCOUNTER — Other Ambulatory Visit: Payer: Self-pay | Admitting: Cardiovascular Disease

## 2023-04-21 DIAGNOSIS — I5022 Chronic systolic (congestive) heart failure: Secondary | ICD-10-CM

## 2023-04-21 DIAGNOSIS — Z79899 Other long term (current) drug therapy: Secondary | ICD-10-CM

## 2023-04-21 DIAGNOSIS — I10 Essential (primary) hypertension: Secondary | ICD-10-CM

## 2023-04-21 DIAGNOSIS — I2510 Atherosclerotic heart disease of native coronary artery without angina pectoris: Secondary | ICD-10-CM

## 2023-04-28 ENCOUNTER — Other Ambulatory Visit: Payer: Self-pay | Admitting: Cardiovascular Disease

## 2023-04-28 DIAGNOSIS — I10 Essential (primary) hypertension: Secondary | ICD-10-CM

## 2023-04-28 DIAGNOSIS — Z79899 Other long term (current) drug therapy: Secondary | ICD-10-CM

## 2023-04-28 DIAGNOSIS — I2510 Atherosclerotic heart disease of native coronary artery without angina pectoris: Secondary | ICD-10-CM

## 2023-04-28 DIAGNOSIS — I5022 Chronic systolic (congestive) heart failure: Secondary | ICD-10-CM

## 2023-05-25 LAB — LAB REPORT - SCANNED
A1c: 10.9
EGFR: 69

## 2023-05-28 ENCOUNTER — Encounter: Payer: Self-pay | Admitting: Cardiovascular Disease

## 2023-05-28 ENCOUNTER — Ambulatory Visit: Payer: Medicare HMO | Attending: Cardiovascular Disease | Admitting: Cardiovascular Disease

## 2023-05-28 VITALS — BP 98/60 | HR 82 | Ht 64.0 in | Wt 149.8 lb

## 2023-05-28 DIAGNOSIS — I2510 Atherosclerotic heart disease of native coronary artery without angina pectoris: Secondary | ICD-10-CM

## 2023-05-28 DIAGNOSIS — Z79899 Other long term (current) drug therapy: Secondary | ICD-10-CM

## 2023-05-28 DIAGNOSIS — I10 Essential (primary) hypertension: Secondary | ICD-10-CM

## 2023-05-28 DIAGNOSIS — Z95810 Presence of automatic (implantable) cardiac defibrillator: Secondary | ICD-10-CM | POA: Diagnosis not present

## 2023-05-28 DIAGNOSIS — I5020 Unspecified systolic (congestive) heart failure: Secondary | ICD-10-CM

## 2023-05-28 MED ORDER — NITROGLYCERIN 0.4 MG SL SUBL: 0.4000 mg | SUBLINGUAL_TABLET | SUBLINGUAL | 3 refills | Status: AC | PRN

## 2023-05-28 NOTE — Assessment & Plan Note (Signed)
 No symptoms.  Continue current management.  No nitroglycerin used in last year.  Will refill her prescription at her request.

## 2023-05-28 NOTE — Progress Notes (Signed)
 Cardiology Office Note:    Date:  05/28/2023   ID:  Emily Mays, DOB 19-Mar-1961, MRN [id number]  PCP:  Dawayne Kerney SQUIBB, MD   [facility] Providers Cardiologist:  Ozell Fell, MD Electrophysiologist:  Lynwood Rakers, MD (Inactive)     Referring MD: Dawayne Kerney SQUIBB, MD   Chief Complaint  Patient presents with   Coronary Artery Disease    History of Present Illness:    Emily Mays is a 63 y.o. female with a hx of:  Coronary artery disease  S/p SCAD (LAD) in 2010 >> s/p extensive stenting of LAD and LCx Systolic CHF Ischemic CM cMRI 89/7979: EF 35 Echocardiogram 8/16: EF 35-40 S/p ICD  Hypertension  GERD  The patient is here alone today.  She is doing well at present.  She denies any interval symptoms of chest pain or pressure.  No shortness of breath, orthopnea, or PND.  No leg edema.  She has mild lightheadedness when she changes position from sitting to standing but this only happens on rare occasion and she reports no significant change over time.  She has not had presyncope or syncope.  She denies edema, orthopnea, or PND.  She had taken furosemide  at an increased dose after a cruise when she had some leg swelling.  She is now back to taking 20 mg twice daily.  Overall doing well.  Had recent PCP follow-up and labs drawn last week.  Current Medications: Current Meds  Medication Sig   aspirin 81 MG chewable tablet 1 tablet Orally Once a day   atorvastatin  (LIPITOR) 40 MG tablet TAKE ONE TABLET BY MOUTH EVERY DAY   Black Cohosh 40 MG CAPS Take 40 mg by mouth 2 (two) times daily.   Blood Glucose Monitoring Suppl (PHARMACIST CHOICE AUTOCODE SYS) w/Device KIT Check BG Once a day (DX: E11.65)   carvedilol  (COREG ) 6.25 MG tablet TAKE ONE TABLET BY MOUTH 2 TIMES A DAY   Cholecalciferol (VITAMIN D3) 125 MCG (5000 UT) CHEW Chew 1 tablet by mouth daily.   clobetasol cream (TEMOVATE) 0.05 %    Cyanocobalamin  (VITAMIN B12) 1000 MCG TBCR 1 tablet Orally Once a day for  30 day(s)   digoxin  (LANOXIN ) 0.125 MG tablet Take 1 tablet (125 mcg total) by mouth daily.   FARXIGA 10 MG TABS tablet Take 10 mg by mouth daily.   furosemide  (LASIX ) 20 MG tablet TAKE 3 TABLETS BY MOUTH DAILY   glipiZIDE (GLUCOTROL XL) 10 MG 24 hr tablet Take 1 tablet by mouth daily with breakfast.   glucose blood test strip CHECK BLOOD GLUCOSE IN VITRO THREE TIMES A DAY (DX: E11.65) 90 DAYS   hydrOXYzine (ATARAX/VISTARIL) 25 MG tablet Take 25 mg by mouth every 8 (eight) hours as needed for anxiety.    ibuprofen (ADVIL) 800 MG tablet Take 800 mg by mouth 3 (three) times daily.   insulin degludec (TRESIBA FLEXTOUCH) 100 UNIT/ML FlexTouch Pen inject 32 units   Insulin Pen Needle 32G X 6 MM MISC 1 Dose by Does not apply route once a week.   oxyCODONE -acetaminophen  (PERCOCET) 10-325 MG per tablet Take 1 tablet by mouth every 6 (six) hours as needed for pain.    pantoprazole (PROTONIX) 40 MG tablet Take 40 mg by mouth daily.   pioglitazone (ACTOS) 30 MG tablet Take 1 tablet by mouth daily.   potassium chloride  SA (KLOR-CON  M) 20 MEQ tablet Take 1 tablet (20 mEq total) by mouth 2 (two) times daily.   pregabalin (LYRICA) 75  MG capsule Take 75 mg by mouth 2 (two) times daily.   promethazine (PHENERGAN) 25 MG tablet Take 25 mg by mouth 2 (two) times daily as needed.   Semaglutide,0.25 or 0.5MG /DOS, (OZEMPIC, 0.25 OR 0.5 MG/DOSE,) 2 MG/1.5ML SOPN Inject 0.25 mg into the skin once a week.   tiZANidine (ZANAFLEX) 4 MG tablet Take 4 mg by mouth every 6 (six) hours as needed for muscle spasms.   Vitamin D, Ergocalciferol, (DRISDOL) 50000 units CAPS capsule Take 50,000 Units by mouth 2 (two) times a week.   [DISCONTINUED] nitroGLYCERIN  (NITROSTAT ) 0.4 MG SL tablet Place 1 tablet (0.4 mg total) under the tongue every 5 (five) minutes as needed for chest pain (up to 3 doses MAX).     Allergies:   Duloxetine hcl and Penicillins   ROS:   Please see the history of present illness.    All other systems  reviewed and are negative.  EKGs/Labs/Other Studies Reviewed:    The following studies were reviewed today: Cardiac Studies & Procedures      ECHOCARDIOGRAM  ECHOCARDIOGRAM COMPLETE 11/10/2019  Narrative ECHOCARDIOGRAM REPORT    Patient Name:   Emily Mays Date of Exam: 11/10/2019 Medical Rec #:  [id number]       Height:       64.0 in Accession #:    [id number]      Weight:       190.0 lb Date of Birth:  [birth year]      BSA:          1.914 m Patient Age:    58 years        BP:           85/54 mmHg Patient Gender: F               HR:           88 bpm. Exam Location:  Outpatient  Procedure: 2D Echo and Intracardiac Opacification Agent  Indications:    LV dysfunction  History:        Patient has prior history of Echocardiogram examinations, most recent 12/22/2014. Coronary artery disease. Congestive heart failure. Risk factors: AICD Implanted, Spontaneous LAD Dissection (2010), Ischemic Cardiomyopathy. Family history of coronary artery disease. Former tobacco use. Hypertension. Recent hypotension, recent falls with multiple fractures.  Sonographer:    Annabella Fell RDCS Referring Phys: [id number] LAMARR HERO LAWRENCE  IMPRESSIONS   1. Left ventricular ejection fraction, by estimation, is 30 to 35%. The left ventricle has moderately decreased function. The left ventricle demonstrates regional wall motion abnormalities (see scoring diagram/findings for description). Left ventricular diastolic parameters are consistent with Grade I diastolic dysfunction (impaired relaxation). There is severe akinesis of the left ventricular, mid-apical anteroseptal wall and apical segment. 2. Right ventricular systolic function is mildly reduced. The right ventricular size is normal. There is normal pulmonary artery systolic pressure. 3. The mitral valve is normal in structure. No evidence of mitral valve regurgitation. No evidence of mitral stenosis. 4. The aortic valve was not well visualized.  Aortic valve regurgitation is not visualized. No aortic stenosis is present.  FINDINGS Left Ventricle: Left ventricular ejection fraction, by estimation, is 30 to 35%. The left ventricle has moderately decreased function. The left ventricle demonstrates regional wall motion abnormalities. Severe akinesis of the left ventricular, mid-apical anteroseptal wall and apical segment. Definity  contrast agent was given IV to delineate the left ventricular endocardial borders. The left ventricular internal cavity size was normal in size. There is no left ventricular hypertrophy. Left  ventricular diastolic parameters are consistent with Grade I diastolic dysfunction (impaired relaxation).  Right Ventricle: The right ventricular size is normal. No increase in right ventricular wall thickness. Right ventricular systolic function is mildly reduced. There is normal pulmonary artery systolic pressure. The tricuspid regurgitant velocity is 2.21 m/s, and with an assumed right atrial pressure of 3 mmHg, the estimated right ventricular systolic pressure is 22.5 mmHg.  Left Atrium: Left atrial size was normal in size.  Right Atrium: Right atrial size was normal in size.  Pericardium: There is no evidence of pericardial effusion.  Mitral Valve: The mitral valve is normal in structure. No evidence of mitral valve regurgitation. No evidence of mitral valve stenosis.  Tricuspid Valve: The tricuspid valve is grossly normal. Tricuspid valve regurgitation is trivial.  Aortic Valve: The aortic valve was not well visualized. Aortic valve regurgitation is not visualized. No aortic stenosis is present.  Pulmonic Valve: The pulmonic valve was normal in structure. Pulmonic valve regurgitation is not visualized. No evidence of pulmonic stenosis.  Aorta: The aortic root and ascending aorta are structurally normal, with no evidence of dilitation.  IAS/Shunts: The atrial septum is grossly normal.  Additional Comments: A pacer  wire is visualized.   LEFT VENTRICLE PLAX 2D LVIDd:         4.20 cm      Diastology LVIDs:         3.30 cm      LV e' lateral:   6.74 cm/s LV PW:         0.90 cm      LV E/e' lateral: 6.4 LV IVS:        1.10 cm      LV e' medial:    3.92 cm/s LVOT diam:     1.60 cm      LV E/e' medial:  11.0 LV SV:         27 LV SV Index:   14 LVOT Area:     2.01 cm  LV Volumes (MOD) LV vol d, MOD A2C: 112.0 ml LV vol d, MOD A4C: 125.0 ml LV vol s, MOD A2C: 59.8 ml LV vol s, MOD A4C: 71.0 ml LV SV MOD A2C:     52.2 ml LV SV MOD A4C:     125.0 ml LV SV MOD BP:      51.4 ml  RIGHT VENTRICLE TAPSE (M-mode): 1.6 cm  LEFT ATRIUM             Index       RIGHT ATRIUM           Index LA diam:        4.00 cm 2.09 cm/m  RA Area:     10.80 cm LA Vol (A2C):   31.8 ml 16.61 ml/m RA Volume:   21.40 ml  11.18 ml/m LA Vol (A4C):   15.0 ml 7.84 ml/m LA Biplane Vol: 22.1 ml 11.55 ml/m AORTIC VALVE LVOT Vmax:   86.70 cm/s LVOT Vmean:  63.200 cm/s LVOT VTI:    0.133 m  AORTA Ao Root diam: 3.00 cm Ao Asc diam:  3.50 cm  MITRAL VALVE               TRICUSPID VALVE MV Area (PHT): 3.02 cm    TR Peak grad:   19.5 mmHg MV Decel Time: 251 msec    TR Vmax:        221.00 cm/s MV E velocity: 43.30 cm/s MV A velocity: 61.30 cm/s  SHUNTS MV E/A ratio:  0.71        Systemic VTI:  0.13 m Systemic Diam: 1.60 cm  Aleene Passe MD Electronically signed by Aleene Passe MD Signature Date/Time: 11/10/2019/3:06:11 PM    Final     CARDIAC MRI  MR CARDIAC MORPHOLOGY W WO CONTRAST 02/24/2009  Narrative Cardiac MRI  Indication Ischemic DCM ? EF  Protocol:  The patient was scanned on a 1.5 Tesla GE magnet.  A dedicated cardiac coil was used.  Functional imaging was done using Fiesta sequences.  2,3,and 4 chamber views were done to assess RWMA's.  The patient received 36cc of Magnevist.  After 10 minutes inversion recovery sequences were performed to assess for scar tissue.  Quantitative EF was  calculated using mass analysis software in the short-axis plane  Findings:  There was moderate LVE.  There was mild LAE.  Right sided cardiac chambers were normal.  There was no pericardial effusion.  The mid and distal anterior wall, apex and septum were thinned and akinetic.  Hyper-enhancement images showed full thickness scar involving the mid and distal anterior wall, septum and apex.  Quantitative EF was 35%.  There was no mural apical clot.  Impression  1)    Moderate LVE with anterior, apical and septal akineis.  EF 35%  2)    Full thickness scar with thinning of the mid and distal anterior wall, septum and apex. 3)    Mild LAE  Provider: Tilford Mangle         EKG:   EKG Interpretation Date/Time:  Monday May 28 2023 08:46:38 EST Ventricular Rate:  82 PR Interval:  184 QRS Duration:  86 QT Interval:  352 QTC Calculation: 411 R Axis:   25  Text Interpretation: Sinus rhythm with occasional Premature ventricular complexes Anteroseptal infarct (cited on or before 16-May-2013) When compared with ECG of 16-May-2013 10:09, Premature ventricular complexes are now Present Nonspecific T wave abnormality now evident in Anterior leads Confirmed by Wonda Sharper [phone]) on 05/28/2023 8:54:20 AM    Recent Labs: 06/09/2022: BUN 8; Creatinine, Ser 0.99; Potassium 3.9; Sodium 139  Recent Lipid Panel    Component Value Date/Time   CHOL 117 04/04/2010 0935   TRIG 151.0 (H) 04/04/2010 0935   HDL 31.70 (L) 04/04/2010 0935   CHOLHDL 4 04/04/2010 0935   VLDL 30.2 04/04/2010 0935   LDLCALC 55 04/04/2010 0935   LDLDIRECT 71.4 09/17/2009 1044     Risk Assessment/Calculations:                Physical Exam:    VS:  BP 98/60   Pulse 82   Ht 5' 4 (1.626 m)   Wt 149 lb 12.8 oz (67.9 kg)   SpO2 98%   BMI 25.71 kg/m     Wt Readings from Last 3 Encounters:  05/28/23 149 lb 12.8 oz (67.9 kg)  04/14/22 191 lb 3.2 oz (86.7 kg)  10/06/21 179 lb 12.8 oz (81.6 kg)     GEN:   Well nourished, well developed in no acute distress HEENT: Normal NECK: No JVD; No carotid bruits LYMPHATICS: No lymphadenopathy CARDIAC: RRR, no murmurs, rubs, gallops RESPIRATORY:  Clear to auscultation without rales, wheezing or rhonchi  ABDOMEN: Soft, non-tender, non-distended MUSCULOSKELETAL:  No edema; No deformity  SKIN: Warm and dry NEUROLOGIC:  Alert and oriented x 3 PSYCHIATRIC:  Normal affect   Assessment & Plan Essential hypertension BP well-controlled.  Patient has had symptomatic hypotension in the past which is limited her GDMT.  Continue carvedilol  and Farxiga.  Unable to tolerate ACE/ARB because of low blood pressure. Coronary artery disease involving native heart without angina pectoris, unspecified vessel or lesion type No symptoms.  Continue current management.  No nitroglycerin  used in last year.  Will refill her prescription at her request. HFrEF (heart failure with reduced ejection fraction) (HCC) LVEF 30 to 35%.  As above, medical therapy limited by low blood pressure.  Continue Farxiga and carvedilol  at current doses.  Fortunately she is minimally symptomatic at present.  Will schedule the patient to come in for a digoxin  level.  She has already taken her medication this morning. Coronary artery disease involving native coronary artery of native heart without angina pectoris As above.  Tolerating aspirin and atorvastatin .  Will send for recent labs that were drawn by her PCP. S/P ICD (internal cardiac defibrillator) procedure We will touch base with device clinic to establish her routine follow-up.  Previously followed with Dr. Kelsie.  No ICD discharges in the past year. Medication management As above      Medication Adjustments/Labs and Tests Ordered: Current medicines are reviewed at length with the patient today.  Concerns regarding medicines are outlined above.  Orders Placed This Encounter  Procedures   Digoxin  level   EKG 12-Lead   Meds ordered this  encounter  Medications   nitroGLYCERIN  (NITROSTAT ) 0.4 MG SL tablet    Sig: Place 1 tablet (0.4 mg total) under the tongue every 5 (five) minutes as needed for chest pain (up to 3 doses MAX).    Dispense:  25 tablet    Refill:  3    Patient Instructions  Lab Work: Digoxin  Level *will request others from [place]** If you have labs (blood work) drawn today and your tests are completely normal, you will receive your results only by: MyChart Message (if you have MyChart) OR A paper copy in the mail If you have any lab test that is abnormal or we need to change your treatment, we will call you to review the results.  Testing/Procedures: EP/device clinic follow-up   Follow-Up: At [facility], you and your health needs are our priority.  As part of our continuing mission to provide you with exceptional heart care, we have created designated Provider Care Teams.  These Care Teams include your primary Cardiologist (physician) and Advanced Practice Providers (APPs -  Physician Assistants and Nurse Practitioners) who all work together to provide you with the care you need, when you need it.  We recommend signing up for the patient portal called MyChart.  Sign up information is provided on this After Visit Summary.  MyChart is used to connect with patients for Virtual Visits (Telemedicine).  Patients are able to view lab/test results, encounter notes, upcoming appointments, etc.  Non-urgent messages can be sent to your provider as well.   To learn more about what you can do with MyChart, go to [email or web address].    Your next appointment:   1 year(s)  Provider:   Ozell Fell, MD             Signed, Ozell Fell, MD  05/28/2023 1:43 PM    [facility]

## 2023-05-28 NOTE — Assessment & Plan Note (Signed)
 BP well-controlled.  Patient has had symptomatic hypotension in the past which is limited her GDMT.  Continue carvedilol and [place].  Unable to tolerate ACE/ARB because of low blood pressure.

## 2023-05-28 NOTE — Patient Instructions (Signed)
 Lab Work: Digoxin  Level *will request others from [place]** If you have labs (blood work) drawn today and your tests are completely normal, you will receive your results only by: MyChart Message (if you have MyChart) OR A paper copy in the mail If you have any lab test that is abnormal or we need to change your treatment, we will call you to review the results.  Testing/Procedures: EP/device clinic follow-up   Follow-Up: At [facility], you and your health needs are our priority.  As part of our continuing mission to provide you with exceptional heart care, we have created designated Provider Care Teams.  These Care Teams include your primary Cardiologist (physician) and Advanced Practice Providers (APPs -  Physician Assistants and Nurse Practitioners) who all work together to provide you with the care you need, when you need it.  We recommend signing up for the patient portal called MyChart.  Sign up information is provided on this After Visit Summary.  MyChart is used to connect with patients for Virtual Visits (Telemedicine).  Patients are able to view lab/test results, encounter notes, upcoming appointments, etc.  Non-urgent messages can be sent to your provider as well.   To learn more about what you can do with MyChart, go to [email or web address].    Your next appointment:   1 year(s)  Provider:   Ozell Fell, MD

## 2023-05-28 NOTE — Assessment & Plan Note (Signed)
 As above.  Tolerating aspirin and atorvastatin.  Will send for recent labs that were drawn by her PCP.

## 2023-06-05 LAB — DIGOXIN LEVEL: Digoxin, Serum: 0.7 ng/mL (ref 0.5–0.9)

## 2023-06-08 LAB — LAB REPORT - SCANNED
Calcium: 9.6
Creatinine, POC: 28.1 mg/dL
EGFR: 86
PTH: 64

## 2023-07-06 ENCOUNTER — Encounter: Payer: Medicare HMO | Admitting: Cardiovascular Disease

## 2023-07-06 ENCOUNTER — Ambulatory Visit: Payer: Medicare HMO | Attending: Cardiovascular Disease | Admitting: Cardiovascular Disease

## 2023-07-06 ENCOUNTER — Encounter: Payer: Self-pay | Admitting: Cardiovascular Disease

## 2023-07-06 VITALS — BP 124/74 | HR 84 | Ht 64.0 in | Wt 142.0 lb

## 2023-07-06 DIAGNOSIS — I5020 Unspecified systolic (congestive) heart failure: Secondary | ICD-10-CM | POA: Diagnosis not present

## 2023-07-06 DIAGNOSIS — I255 Ischemic cardiomyopathy: Secondary | ICD-10-CM | POA: Diagnosis not present

## 2023-07-06 DIAGNOSIS — Z95810 Presence of automatic (implantable) cardiac defibrillator: Secondary | ICD-10-CM | POA: Diagnosis not present

## 2023-07-06 LAB — CUP PACEART INCLINIC DEVICE CHECK
Battery Remaining Longevity: 28 mo
Brady Statistic RV Percent Paced: 0 %
Date Time Interrogation Session: 20250221122439
HighPow Impedance: 76.5 Ohm
Implantable Lead Connection Status: 753985
Implantable Lead Implant Date: 20101228
Implantable Lead Location: 753860
Implantable Lead Model: 7122
Implantable Pulse Generator Implant Date: 20150102
Lead Channel Impedance Value: 762.5 Ohm
Lead Channel Pacing Threshold Amplitude: 1 V
Lead Channel Pacing Threshold Amplitude: 1 V
Lead Channel Pacing Threshold Pulse Width: 0.5 ms
Lead Channel Pacing Threshold Pulse Width: 0.5 ms
Lead Channel Sensing Intrinsic Amplitude: 12 mV
Lead Channel Setting Pacing Amplitude: 2.5 V
Lead Channel Setting Pacing Pulse Width: 0.5 ms
Lead Channel Setting Sensing Sensitivity: 0.5 mV
Pulse Gen Serial Number: 7152745
Zone Setting Status: 755011

## 2023-07-06 NOTE — Patient Instructions (Signed)
Medication Instructions:  .isntcur *If you need a refill on your cardiac medications before your next appointment, please call your pharmacy*   Follow-Up: At [facility], you and your health needs are our priority.  As part of our continuing mission to provide you with exceptional heart care, we have created designated Provider Care Teams.  These Care Teams include your primary Cardiologist (physician) and Advanced Practice Providers (APPs -  Physician Assistants and Nurse Practitioners) who all work together to provide you with the care you need, when you need it.  We recommend signing up for the patient portal called "MyChart".  Sign up information is provided on this After Visit Summary.  MyChart is used to connect with patients for Virtual Visits (Telemedicine).  Patients are able to view lab/test results, encounter notes, upcoming appointments, etc.  Non-urgent messages can be sent to your provider as well.   To learn more about what you can do with MyChart, go to [email or web address].    Your next appointment:   1 year(s)  Provider:   York Pellant, MD

## 2023-07-06 NOTE — Progress Notes (Signed)
  Electrophysiology Office Note:    Date:  07/06/2023   ID:  Emily Mays, DOB 03/26/1961, MRN [id number]  PCP:  Galvin Proffer, MD   [name] Cardiologist:  Tonny Bollman, MD Electrophysiologist:  Hillis Range, MD (Inactive)     Referring MD: Galvin Proffer, MD   History of Present Illness:    Emily Mays is a 63 y.o. female with a medical history significant for CHF with reduced EF, ICD in place, coronary disease, referred for device follow-up.     She has a [employer] single-chamber ICD placed for prevention of sudden cardiac death.       Today, she has no device related complaints.  She has not received any discharges.  She has not had any discomfort, drainage from the device site.  EKGs/Labs/Other Studies Reviewed Today:       EKG:         Physical Exam:    VS:  BP 124/74 (BP Location: Right Arm, Patient Position: Sitting, Cuff Size: Normal)   Pulse 84   Ht 5\' 4"  (1.626 m)   Wt 142 lb (64.4 kg)   SpO2 96%   BMI 24.37 kg/m     Wt Readings from Last 3 Encounters:  07/06/23 142 lb (64.4 kg)  05/28/23 149 lb 12.8 oz (67.9 kg)  04/14/22 191 lb 3.2 oz (86.7 kg)     GEN:  Well nourished, well developed in no acute distress CARDIAC: RRR, no murmurs, rubs, gallops The device site is normal -- no tenderness, edema, drainage, redness, threatened erosion.  RESPIRATORY:  Normal work of breathing MUSCULOSKELETAL: no edema    ASSESSMENT & PLAN:     [employer] ICD Single-chamber ICD I reviewed today's device interrogation.  See Paceart for details She is not device dependent  Coronary disease, CHF reduced ejection fraction No changes today      Signed, Maurice Small, MD  07/06/2023 12:41 PM    [facility]

## 2023-07-20 ENCOUNTER — Telehealth: Payer: Self-pay

## 2023-07-20 NOTE — Telephone Encounter (Signed)
 I verified the pt serial number and it is correct. However, the pt monitor is not working correctly. [employer] rep is calling the patient to trouble shoot the pt monitor.

## 2023-07-20 NOTE — Telephone Encounter (Signed)
-----   Message from Nurse Richarda Osmond sent at 07/06/2023 12:27 PM EST ----- Regarding: need SN for new transmitter Please call Monday morning (per Pt request) to get her serial number off her new monitor for [employer].  Thank you! Boneta Lucks

## 2023-07-26 ENCOUNTER — Other Ambulatory Visit: Payer: Self-pay | Admitting: Cardiovascular Disease

## 2023-07-26 DIAGNOSIS — I5022 Chronic systolic (congestive) heart failure: Secondary | ICD-10-CM

## 2023-07-26 DIAGNOSIS — I10 Essential (primary) hypertension: Secondary | ICD-10-CM

## 2023-07-26 DIAGNOSIS — Z79899 Other long term (current) drug therapy: Secondary | ICD-10-CM

## 2023-07-26 DIAGNOSIS — I2510 Atherosclerotic heart disease of native coronary artery without angina pectoris: Secondary | ICD-10-CM

## 2023-07-27 NOTE — Telephone Encounter (Signed)
 [employer] is sending her a new cell adapter. She should receive it in 7-14 business days.

## 2023-09-29 LAB — LAB REPORT - SCANNED
A1c: 12.2
EGFR: 107

## 2023-10-10 ENCOUNTER — Telehealth: Payer: Self-pay | Admitting: Gastroenterology

## 2023-10-10 NOTE — Telephone Encounter (Signed)
 Inbound call from patient stating she was recently at the hospital for gastroparesis. Patient stated she was a previous patient with Dr. Venice Gillis in [place]. Advised Dr. Venice Gillis next new patient available appointment would be in September. Patient states she does not believe she will be able to wait that long. Requesting a call back to discuss further. Please advise, thank you.

## 2023-10-11 NOTE — Telephone Encounter (Signed)
 Patient was recently hospitalized 2 weeks ago at [facility] for gastroparesis d/t history of diabetes and uncontrolled sugars. States nausea & vomiting is currently controlled by reglan. Hospital recommended she f/u with Dr. Venice Gillis to discuss GES. Requesting to only be seen with Dr. Venice Gillis, states she was a patient of his 20 years ago. OV scheduled for 6/13 at 1:50 pm & she's been advised on where to go for appt. Pt had no further questions.

## 2023-10-26 ENCOUNTER — Ambulatory Visit: Admitting: Gastroenterology

## 2023-10-26 ENCOUNTER — Telehealth: Payer: Self-pay

## 2023-10-26 ENCOUNTER — Encounter: Payer: Self-pay | Admitting: Gastroenterology

## 2023-10-26 VITALS — BP 130/82 | HR 83 | Ht 64.0 in | Wt 142.0 lb

## 2023-10-26 DIAGNOSIS — E1143 Type 2 diabetes mellitus with diabetic autonomic (poly)neuropathy: Secondary | ICD-10-CM

## 2023-10-26 DIAGNOSIS — K219 Gastro-esophageal reflux disease without esophagitis: Secondary | ICD-10-CM

## 2023-10-26 MED ORDER — ONDANSETRON HCL 4 MG PO TABS: 4.0000 mg | ORAL_TABLET | Freq: Three times a day (TID) | ORAL | 2 refills | Status: AC | PRN

## 2023-10-26 NOTE — Progress Notes (Signed)
 Chief Complaint: For GI eval  Referring Provider:  Georgean Kindle, MD      ASSESSMENT AND PLAN;   #1. 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  Multiple comorbidities including CAD, HFrEF s/p ICD (EF 35-40%), HTN, poorly controlled DM2  Plan: -Protonix 40mg  po BID to continue. -Reglan 5mg  po QID to continue.  Watch for extrapyramidal side effects.  We will keep the dose the same as it is helping. -Continue zofran  4mg  ODT  #30, 2RF -EGD at [facility](off ozempic x 1 week), then [facility] (after Cardio clearence from Dr Arlester Ladd for EGD) -Best possible control of diabetes. -Gastroparesis diet -Encouraged her to get dentures. -Minimize narcotics/nonsteroidals. -D/W sister.   HPI:    Emily Mays is a 63 y.o. female  With multiple medical problems including uncontrolled insulin requiring type 2 diabetes (hemoglobin A1c 12.2 on 09/27/2023) with suspected gastroparesis/retinopathy, CAD, chronic systolic heart failure s/p AICD (EF improved from 25% to 40), HTN, HLD, former smoking.  For follow-up hospital stay at [facility] from 09/27/2023 to 09/29/2023 for nausea/vomiting/epi pain.  Negative CT Abdo/pelvis.  Thought to be exacerbation of diabetic gastroparesis.  Treated with PPIs, Reglan and Zofran .  Note that she is also on Ozempic. History of Present Illness She is accompanied by her sister.  She does feel better  She does not have any teeth and hence not able to chew foods well.  She is planning to see a dentist soon.  No further nausea/vomiting.  Her abdominal pain is much better.  She denies having any odynophagia or dysphagia.  Her heartburn is under good control with Protonix 40 mg p.o. twice daily.  She uses Zofran  on as-needed basis.  Denies having any significant diarrhea or constipation currently.  No significant weight loss has been noted recently, as she maintains a weight of 142 pounds since February 2025.  Her diabetes management  includes Ozempic 2 mg weekly. She is compliant with dietary modifications, avoiding sugar and caffeine. Her eyes and kidneys are being monitored due to diabetes, with recent kidney function tests showing normal results.  She has a history of coronary artery disease with two stents placed and heart failure with an ejection fraction improved from 25% to 40-45%. An AICD is in place. Her medications include aspirin and beta blockers. She experienced low blood pressure during a previous foot surgery, leading to its cancellation, but her blood pressure today is 130/80 mmHg.  She has a history of high blood pressure and high cholesterol. She used to smoke but has since quit. She has experienced degenerative changes in her back, which are age-related. She has had her gallbladder removed and has a history of liver injury, though her liver function is currently normal.  No melena or hematochezia.    Wt Readings from Last 3 Encounters:  10/26/23 142 lb (64.4 kg)  07/06/23 142 lb (64.4 kg)  05/28/23 149 lb 12.8 oz (67.9 kg)   Past GI workup CT Abdo/pelvis with contrast 09/27/2023 at [facility] - No acute abnormalities - She is s/p cholecystectomy. - Multiple metallic foreign bodies in the right hepatic lobe which may represent prior penetrating trauma. - Degenerative changes in the lumbar spine.  Cologuard test Oct 2023- neg. refuses colonoscopy. Colon 2008- neg to TI.  Repeat in 10 years. Past Medical History:  Diagnosis Date   AICD (automatic cardioverter/defibrillator) present 04/2009   St Jude model [phone]   Anemia    CAD (coronary artery disease)  spontaneous LAD dissection presenting with anterior STEMI. s/p MI 07/24/08 with PCI Endeavor DES to Prox. LAD and BMA to prox. LCX.. relook cath 07/28/08 showing cont. patency of LAD to LCX.   Cancer [facility])    uterine cancer   Chronic systolic HF (heart failure) (HCC)    COPD (chronic obstructive pulmonary disease) (HCC)    no inhaler    DDD (degenerative disc disease)    Diabetes mellitus without complication (HCC)    type 2   Esophageal reflux    GERD (gastroesophageal reflux disease)    HLD (hyperlipidemia)    Hypokalemia    Myocardial infarction [facility])    Other specified forms of chronic ischemic heart disease    echo 07/26/2008 EfF 30% with perapical HK   Paralytic ileus (HCC)    Pneumonia    x 1   Unspecified essential hypertension     Past Surgical History:  Procedure Laterality Date   ABDOMINAL HYSTERECTOMY     BILATERAL SALPINGOOPHORECTOMY     CARDIAC CATHETERIZATION  07/2008   2 cardiac stent placement in March 2010.    CARDIAC DEFIBRILLATOR PLACEMENT  04/2009   St. Jude   CHOLECYSTECTOMY     27 years ago   COLONOSCOPY  12/06/2006   Small internalhemorrhoids. Otherwise normal colonoscopy to terminal ileum   ESOPHAGOGASTRODUODENOSCOPY  10/18/2006   Small hiatal hernia. Mild gastritis. Retained food liley due to previous p.o. intake. There was no evidence of gastric outlet obstruction   IMPLANTABLE CARDIOVERTER DEFIBRILLATOR GENERATOR CHANGE N/A 05/16/2013   Generator change (SJM Fortify Assura VR) for prior device malfunction (capacitors unable to charge)   left pelvic lymphadenectomy     by Dr. Valeen Gartner    TOTAL ABDOMINAL HYSTERECTOMY W/ BILATERAL SALPINGOOPHORECTOMY     WISDOM TOOTH EXTRACTION      Family History  Problem Relation Age of Onset   CAD Mother        CAD with previous angioplasty.   Cancer Father        Bladder cancer   Other Father 52       Borderline Diabetic   Kidney cancer Paternal Aunt    Breast cancer Paternal Aunt    Throat cancer Paternal Uncle    Liver disease Neg Hx    Esophageal cancer Neg Hx    Colon cancer Neg Hx     Social History   Tobacco Use   Smoking status: Former    Current packs/day: 0.50    Types: Cigarettes   Smokeless tobacco: Never   Tobacco comments:    used to smoke 1/2 ppd but has not smoked since discharge.   Vaping Use    Vaping status: Never Used  Substance Use Topics   Alcohol use: Yes    Comment: Occasional    Drug use: No    Current Outpatient Medications  Medication Sig Dispense Refill   Accu-Chek FastClix Lancets MISC USE TO CHECK BLOOD SUGAR LEVELS THREE TIMES A DAY (DX: E11.65) for 90 days     aspirin 81 MG chewable tablet 1 tablet Orally Once a day     atorvastatin  (LIPITOR) 40 MG tablet TAKE ONE TABLET BY MOUTH EVERY DAY 90 tablet 3   Black Cohosh 40 MG CAPS Take 40 mg by mouth 2 (two) times daily.     Blood Glucose Monitoring Suppl (PHARMACIST CHOICE AUTOCODE SYS) w/Device KIT Check BG Once a day (DX: E11.65)     carvedilol  (COREG ) 6.25 MG tablet TAKE ONE TABLET BY MOUTH 2 TIMES  A DAY WITH FOOD 180 tablet 3   Cholecalciferol (VITAMIN D3) 125 MCG (5000 UT) CHEW Chew 1 tablet by mouth daily.     clobetasol cream (TEMOVATE) 0.05 %      Cyanocobalamin  (VITAMIN B12) 1000 MCG TBCR 1 tablet Orally Once a day for 30 day(s)     digoxin  (LANOXIN ) 0.125 MG tablet Take 1 tablet (125 mcg total) by mouth daily. 90 tablet 3   FARXIGA 10 MG TABS tablet Take 10 mg by mouth daily.     furosemide  (LASIX ) 20 MG tablet TAKE 3 TABLETS BY MOUTH DAILY 270 tablet 3   glipiZIDE (GLUCOTROL XL) 10 MG 24 hr tablet Take 1 tablet by mouth daily with breakfast.     glucose blood test strip CHECK BLOOD GLUCOSE IN VITRO THREE TIMES A DAY (DX: E11.65) 90 DAYS     hydrOXYzine (ATARAX/VISTARIL) 25 MG tablet Take 25 mg by mouth every 8 (eight) hours as needed for anxiety.      ibuprofen (ADVIL) 800 MG tablet Take 800 mg by mouth 3 (three) times daily.     insulin degludec (TRESIBA FLEXTOUCH) 100 UNIT/ML FlexTouch Pen inject 32 units     Insulin Pen Needle 32G X 6 MM MISC 1 Dose by Does not apply route once a week.     nitroGLYCERIN  (NITROSTAT ) 0.4 MG SL tablet Place 1 tablet (0.4 mg total) under the tongue every 5 (five) minutes as needed for chest pain (up to 3 doses MAX). 25 tablet 3   oxyCODONE -acetaminophen  (PERCOCET) 10-325 MG  per tablet Take 1 tablet by mouth every 6 (six) hours as needed for pain.      pantoprazole (PROTONIX) 40 MG tablet Take 40 mg by mouth daily.     pioglitazone (ACTOS) 30 MG tablet Take 1 tablet by mouth daily.     potassium chloride  SA (KLOR-CON  M) 20 MEQ tablet Take 1 tablet (20 mEq total) by mouth 2 (two) times daily. 180 tablet 3   pregabalin (LYRICA) 75 MG capsule Take 75 mg by mouth 2 (two) times daily.     promethazine (PHENERGAN) 25 MG tablet Take 25 mg by mouth 2 (two) times daily as needed.     REGLAN 5 MG tablet 1 tablet before meals Orally 4 times a day for 90 days     Semaglutide,0.25 or 0.5MG /DOS, (OZEMPIC, 0.25 OR 0.5 MG/DOSE,) 2 MG/1.5ML SOPN Inject 0.25 mg into the skin once a week.     tiZANidine (ZANAFLEX) 4 MG tablet Take 4 mg by mouth every 6 (six) hours as needed for muscle spasms.     Vitamin D, Ergocalciferol, (DRISDOL) 50000 units CAPS capsule Take 50,000 Units by mouth 2 (two) times a week.     No current facility-administered medications for this visit.    Allergies  Allergen Reactions   Duloxetine Hcl Other (See Comments)    Made heart race   Penicillins Hives    Review of Systems:  Constitutional: Denies fever, chills, diaphoresis, appetite change and fatigue.  HEENT: neg Respiratory: Denies SOB, DOE, cough, chest tightness,  and wheezing.   Cardiovascular: Denies chest pain, palpitations and leg swelling.  Genitourinary: Denies dysuria, urgency, frequency, hematuria, flank pain and difficulty urinating.  Musculoskeletal: Denies myalgias, back pain, joint swelling, arthralgias and gait problem.  Skin: No rash.  Neurological: Denies dizziness, seizures, syncope, weakness, light-headedness, numbness and headaches.  Hematological: Denies adenopathy. Easy bruising, personal or family bleeding history  Psychiatric/Behavioral: Has anxiety or depression     Physical Exam:  BP 130/82   Pulse 83   Ht 5' 4 (1.626 m)   Wt 142 lb (64.4 kg)   BMI 24.37  kg/m  Wt Readings from Last 3 Encounters:  10/26/23 142 lb (64.4 kg)  07/06/23 142 lb (64.4 kg)  05/28/23 149 lb 12.8 oz (67.9 kg)   Constitutional:  Well-developed, in no acute distress. Psychiatric: Normal mood and affect. Behavior is normal. HEENT: Pupils normal.  Conjunctivae are normal. No scleral icterus.  Cardiovascular: Normal rate, regular rhythm. No edema Pulmonary/chest: Effort normal and breath sounds normal. No wheezing, rales or rhonchi. Abdominal: Soft, nondistended. Nontender. Bowel sounds active throughout. There are no masses palpable. No hepatomegaly. Rectal: Deferred Neurological: Alert and oriented to person place and time. Skin: Skin is warm and dry. No rashes noted.  Data Reviewed: I have personally reviewed following labs and imaging studies  CBC:    Latest Ref Rng & Units 10/29/2019   11:57 AM 12/14/2014    4:26 PM 05/16/2013   10:49 AM  CBC  WBC 4.0 - 10.5 K/uL 8.2  8.4  8.1   Hemoglobin 12.0 - 15.0 g/dL 16.1  09.6  04.5   Hematocrit 36.0 - 46.0 % 37.1  38.4  37.5   Platelets 150 - 400 K/uL 193  248.0  189     CMP:    Latest Ref Rng & Units 06/07/2023   12:00 AM 06/09/2022   10:31 AM 04/28/2022   12:09 PM  CMP  Glucose 70 - 99 mg/dL  409  811   BUN 6 - 24 mg/dL  8  15   Creatinine 9.14 - 1.00 mg/dL  7.82  9.56   Sodium 213 - 144 mmol/L  139  145   Potassium 3.5 - 5.2 mmol/L  3.9  3.2   Chloride 96 - 106 mmol/L  100  103   CO2 20 - 29 mmol/L  22  26   Calcium   9.6     9.6  9.1      This result is from an external source.    Labs from [facility] 09/29/2023 -CBC: Hemoglobin 13, MCV 87, platelets 195, WBC count 9.4 - CMP 09/27/2023: All normal except glucose 221.  Normal creatinine 0.7. - Hemoglobin A1c 12.2 - Lipase 86.    Magnus Schuller, MD 10/26/2023, 2:20 PM  Cc: Georgean Kindle, MD

## 2023-10-26 NOTE — H&P (View-Only) (Signed)
 Chief Complaint: For GI eval  Referring Provider:  Georgean Kindle, MD      ASSESSMENT AND PLAN;   #1. 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  Multiple comorbidities including CAD, HFrEF s/p ICD (EF 35-40%), HTN, poorly controlled DM2  Plan: -Protonix 40mg  po BID to continue. -Reglan 5mg  po QID to continue.  Watch for extrapyramidal side effects.  We will keep the dose the same as it is helping. -Continue zofran  4mg  ODT  #30, 2RF -EGD at [facility](off ozempic x 1 week), then [facility] (after Cardio clearence from Dr Arlester Ladd for EGD) -Best possible control of diabetes. -Gastroparesis diet -Encouraged her to get dentures. -Minimize narcotics/nonsteroidals. -D/W sister.   HPI:    Emily Mays is a 63 y.o. female  With multiple medical problems including uncontrolled insulin requiring type 2 diabetes (hemoglobin A1c 12.2 on 09/27/2023) with suspected gastroparesis/retinopathy, CAD, chronic systolic heart failure s/p AICD (EF improved from 25% to 40), HTN, HLD, former smoking.  For follow-up hospital stay at [facility] from 09/27/2023 to 09/29/2023 for nausea/vomiting/epi pain.  Negative CT Abdo/pelvis.  Thought to be exacerbation of diabetic gastroparesis.  Treated with PPIs, Reglan and Zofran .  Note that she is also on Ozempic. History of Present Illness She is accompanied by her sister.  She does feel better  She does not have any teeth and hence not able to chew foods well.  She is planning to see a dentist soon.  No further nausea/vomiting.  Her abdominal pain is much better.  She denies having any odynophagia or dysphagia.  Her heartburn is under good control with Protonix 40 mg p.o. twice daily.  She uses Zofran  on as-needed basis.  Denies having any significant diarrhea or constipation currently.  No significant weight loss has been noted recently, as she maintains a weight of 142 pounds since February 2025.  Her diabetes management  includes Ozempic 2 mg weekly. She is compliant with dietary modifications, avoiding sugar and caffeine. Her eyes and kidneys are being monitored due to diabetes, with recent kidney function tests showing normal results.  She has a history of coronary artery disease with two stents placed and heart failure with an ejection fraction improved from 25% to 40-45%. An AICD is in place. Her medications include aspirin and beta blockers. She experienced low blood pressure during a previous foot surgery, leading to its cancellation, but her blood pressure today is 130/80 mmHg.  She has a history of high blood pressure and high cholesterol. She used to smoke but has since quit. She has experienced degenerative changes in her back, which are age-related. She has had her gallbladder removed and has a history of liver injury, though her liver function is currently normal.  No melena or hematochezia.    Wt Readings from Last 3 Encounters:  10/26/23 142 lb (64.4 kg)  07/06/23 142 lb (64.4 kg)  05/28/23 149 lb 12.8 oz (67.9 kg)   Past GI workup CT Abdo/pelvis with contrast 09/27/2023 at [facility] - No acute abnormalities - She is s/p cholecystectomy. - Multiple metallic foreign bodies in the right hepatic lobe which may represent prior penetrating trauma. - Degenerative changes in the lumbar spine.  Cologuard test Oct 2023- neg. refuses colonoscopy. Colon 2008- neg to TI.  Repeat in 10 years. Past Medical History:  Diagnosis Date   AICD (automatic cardioverter/defibrillator) present 04/2009   St Jude model [phone]   Anemia    CAD (coronary artery disease)  spontaneous LAD dissection presenting with anterior STEMI. s/p MI 07/24/08 with PCI Endeavor DES to Prox. LAD and BMA to prox. LCX.. relook cath 07/28/08 showing cont. patency of LAD to LCX.   Cancer [facility])    uterine cancer   Chronic systolic HF (heart failure) (HCC)    COPD (chronic obstructive pulmonary disease) (HCC)    no inhaler    DDD (degenerative disc disease)    Diabetes mellitus without complication (HCC)    type 2   Esophageal reflux    GERD (gastroesophageal reflux disease)    HLD (hyperlipidemia)    Hypokalemia    Myocardial infarction [facility])    Other specified forms of chronic ischemic heart disease    echo 07/26/2008 EfF 30% with perapical HK   Paralytic ileus (HCC)    Pneumonia    x 1   Unspecified essential hypertension     Past Surgical History:  Procedure Laterality Date   ABDOMINAL HYSTERECTOMY     BILATERAL SALPINGOOPHORECTOMY     CARDIAC CATHETERIZATION  07/2008   2 cardiac stent placement in March 2010.    CARDIAC DEFIBRILLATOR PLACEMENT  04/2009   St. Jude   CHOLECYSTECTOMY     27 years ago   COLONOSCOPY  12/06/2006   Small internalhemorrhoids. Otherwise normal colonoscopy to terminal ileum   ESOPHAGOGASTRODUODENOSCOPY  10/18/2006   Small hiatal hernia. Mild gastritis. Retained food liley due to previous p.o. intake. There was no evidence of gastric outlet obstruction   IMPLANTABLE CARDIOVERTER DEFIBRILLATOR GENERATOR CHANGE N/A 05/16/2013   Generator change (SJM Fortify Assura VR) for prior device malfunction (capacitors unable to charge)   left pelvic lymphadenectomy     by Dr. Valeen Gartner    TOTAL ABDOMINAL HYSTERECTOMY W/ BILATERAL SALPINGOOPHORECTOMY     WISDOM TOOTH EXTRACTION      Family History  Problem Relation Age of Onset   CAD Mother        CAD with previous angioplasty.   Cancer Father        Bladder cancer   Other Father 52       Borderline Diabetic   Kidney cancer Paternal Aunt    Breast cancer Paternal Aunt    Throat cancer Paternal Uncle    Liver disease Neg Hx    Esophageal cancer Neg Hx    Colon cancer Neg Hx     Social History   Tobacco Use   Smoking status: Former    Current packs/day: 0.50    Types: Cigarettes   Smokeless tobacco: Never   Tobacco comments:    used to smoke 1/2 ppd but has not smoked since discharge.   Vaping Use    Vaping status: Never Used  Substance Use Topics   Alcohol use: Yes    Comment: Occasional    Drug use: No    Current Outpatient Medications  Medication Sig Dispense Refill   Accu-Chek FastClix Lancets MISC USE TO CHECK BLOOD SUGAR LEVELS THREE TIMES A DAY (DX: E11.65) for 90 days     aspirin 81 MG chewable tablet 1 tablet Orally Once a day     atorvastatin  (LIPITOR) 40 MG tablet TAKE ONE TABLET BY MOUTH EVERY DAY 90 tablet 3   Black Cohosh 40 MG CAPS Take 40 mg by mouth 2 (two) times daily.     Blood Glucose Monitoring Suppl (PHARMACIST CHOICE AUTOCODE SYS) w/Device KIT Check BG Once a day (DX: E11.65)     carvedilol  (COREG ) 6.25 MG tablet TAKE ONE TABLET BY MOUTH 2 TIMES  A DAY WITH FOOD 180 tablet 3   Cholecalciferol (VITAMIN D3) 125 MCG (5000 UT) CHEW Chew 1 tablet by mouth daily.     clobetasol cream (TEMOVATE) 0.05 %      Cyanocobalamin  (VITAMIN B12) 1000 MCG TBCR 1 tablet Orally Once a day for 30 day(s)     digoxin  (LANOXIN ) 0.125 MG tablet Take 1 tablet (125 mcg total) by mouth daily. 90 tablet 3   FARXIGA 10 MG TABS tablet Take 10 mg by mouth daily.     furosemide  (LASIX ) 20 MG tablet TAKE 3 TABLETS BY MOUTH DAILY 270 tablet 3   glipiZIDE (GLUCOTROL XL) 10 MG 24 hr tablet Take 1 tablet by mouth daily with breakfast.     glucose blood test strip CHECK BLOOD GLUCOSE IN VITRO THREE TIMES A DAY (DX: E11.65) 90 DAYS     hydrOXYzine (ATARAX/VISTARIL) 25 MG tablet Take 25 mg by mouth every 8 (eight) hours as needed for anxiety.      ibuprofen (ADVIL) 800 MG tablet Take 800 mg by mouth 3 (three) times daily.     insulin degludec (TRESIBA FLEXTOUCH) 100 UNIT/ML FlexTouch Pen inject 32 units     Insulin Pen Needle 32G X 6 MM MISC 1 Dose by Does not apply route once a week.     nitroGLYCERIN  (NITROSTAT ) 0.4 MG SL tablet Place 1 tablet (0.4 mg total) under the tongue every 5 (five) minutes as needed for chest pain (up to 3 doses MAX). 25 tablet 3   oxyCODONE -acetaminophen  (PERCOCET) 10-325 MG  per tablet Take 1 tablet by mouth every 6 (six) hours as needed for pain.      pantoprazole (PROTONIX) 40 MG tablet Take 40 mg by mouth daily.     pioglitazone (ACTOS) 30 MG tablet Take 1 tablet by mouth daily.     potassium chloride  SA (KLOR-CON  M) 20 MEQ tablet Take 1 tablet (20 mEq total) by mouth 2 (two) times daily. 180 tablet 3   pregabalin (LYRICA) 75 MG capsule Take 75 mg by mouth 2 (two) times daily.     promethazine (PHENERGAN) 25 MG tablet Take 25 mg by mouth 2 (two) times daily as needed.     REGLAN 5 MG tablet 1 tablet before meals Orally 4 times a day for 90 days     Semaglutide,0.25 or 0.5MG /DOS, (OZEMPIC, 0.25 OR 0.5 MG/DOSE,) 2 MG/1.5ML SOPN Inject 0.25 mg into the skin once a week.     tiZANidine (ZANAFLEX) 4 MG tablet Take 4 mg by mouth every 6 (six) hours as needed for muscle spasms.     Vitamin D, Ergocalciferol, (DRISDOL) 50000 units CAPS capsule Take 50,000 Units by mouth 2 (two) times a week.     No current facility-administered medications for this visit.    Allergies  Allergen Reactions   Duloxetine Hcl Other (See Comments)    Made heart race   Penicillins Hives    Review of Systems:  Constitutional: Denies fever, chills, diaphoresis, appetite change and fatigue.  HEENT: neg Respiratory: Denies SOB, DOE, cough, chest tightness,  and wheezing.   Cardiovascular: Denies chest pain, palpitations and leg swelling.  Genitourinary: Denies dysuria, urgency, frequency, hematuria, flank pain and difficulty urinating.  Musculoskeletal: Denies myalgias, back pain, joint swelling, arthralgias and gait problem.  Skin: No rash.  Neurological: Denies dizziness, seizures, syncope, weakness, light-headedness, numbness and headaches.  Hematological: Denies adenopathy. Easy bruising, personal or family bleeding history  Psychiatric/Behavioral: Has anxiety or depression     Physical Exam:  BP 130/82   Pulse 83   Ht 5' 4 (1.626 m)   Wt 142 lb (64.4 kg)   BMI 24.37  kg/m  Wt Readings from Last 3 Encounters:  10/26/23 142 lb (64.4 kg)  07/06/23 142 lb (64.4 kg)  05/28/23 149 lb 12.8 oz (67.9 kg)   Constitutional:  Well-developed, in no acute distress. Psychiatric: Normal mood and affect. Behavior is normal. HEENT: Pupils normal.  Conjunctivae are normal. No scleral icterus.  Cardiovascular: Normal rate, regular rhythm. No edema Pulmonary/chest: Effort normal and breath sounds normal. No wheezing, rales or rhonchi. Abdominal: Soft, nondistended. Nontender. Bowel sounds active throughout. There are no masses palpable. No hepatomegaly. Rectal: Deferred Neurological: Alert and oriented to person place and time. Skin: Skin is warm and dry. No rashes noted.  Data Reviewed: I have personally reviewed following labs and imaging studies  CBC:    Latest Ref Rng & Units 10/29/2019   11:57 AM 12/14/2014    4:26 PM 05/16/2013   10:49 AM  CBC  WBC 4.0 - 10.5 K/uL 8.2  8.4  8.1   Hemoglobin 12.0 - 15.0 g/dL 16.1  09.6  04.5   Hematocrit 36.0 - 46.0 % 37.1  38.4  37.5   Platelets 150 - 400 K/uL 193  248.0  189     CMP:    Latest Ref Rng & Units 06/07/2023   12:00 AM 06/09/2022   10:31 AM 04/28/2022   12:09 PM  CMP  Glucose 70 - 99 mg/dL  409  811   BUN 6 - 24 mg/dL  8  15   Creatinine 9.14 - 1.00 mg/dL  7.82  9.56   Sodium 213 - 144 mmol/L  139  145   Potassium 3.5 - 5.2 mmol/L  3.9  3.2   Chloride 96 - 106 mmol/L  100  103   CO2 20 - 29 mmol/L  22  26   Calcium   9.6     9.6  9.1      This result is from an external source.    Labs from [facility] 09/29/2023 -CBC: Hemoglobin 13, MCV 87, platelets 195, WBC count 9.4 - CMP 09/27/2023: All normal except glucose 221.  Normal creatinine 0.7. - Hemoglobin A1c 12.2 - Lipase 86.    Magnus Schuller, MD 10/26/2023, 2:20 PM  Cc: Georgean Kindle, MD

## 2023-10-26 NOTE — Telephone Encounter (Signed)
 S/W pt and scheduled TELE Preop appt 11/05/23. Med rec and consent done

## 2023-10-26 NOTE — Patient Instructions (Addendum)
 _______________________________________________________  If your blood pressure at your visit was 140/90 or greater, please contact your primary care physician to follow up on this.  _______________________________________________________  If you are age 61 or older, your body mass index should be between 23-30. Your Body mass index is 24.37 kg/m. If this is out of the aforementioned range listed, please consider follow up with your Primary Care Provider.  If you are age 41 or younger, your body mass index should be between 19-25. Your Body mass index is 24.37 kg/m. If this is out of the aformentioned range listed, please consider follow up with your Primary Care Provider.   ________________________________________________________  The [facility] providers would like to encourage you to use MYCHART to communicate with providers for non-urgent requests or questions.  Due to long hold times on the telephone, sending your provider a message by [facility] may be a faster and more efficient way to get a response.  Please allow 48 business hours for a response.  Please remember that this is for non-urgent requests.  _______________________________________________________  Elene Griffes will be contacted by our office prior to your procedure regarding clearance.  If you do not hear from our office 1 week prior to your scheduled procedure, please call [phone] to discuss.   We have sent the following medications to your pharmacy for you to pick up at your convenience: Zofran   Continue Protonix 2 times a day and Reglan  You have been scheduled for an endoscopy. Please follow written instructions given to you at your visit today.  If you use inhalers (even only as needed), please bring them with you on the day of your procedure.  If you take any of the following medications, they will need to be adjusted prior to your procedure:   DO NOT TAKE 7 DAYS PRIOR TO TEST- Trulicity (dulaglutide) Ozempic, Wegovy  (semaglutide) Mounjaro (tirzepatide) Bydureon Bcise (exanatide extended release)  DO NOT TAKE 1 DAY PRIOR TO YOUR TEST Rybelsus (semaglutide) Adlyxin (lixisenatide) Victoza (liraglutide) Byetta (exanatide) ___________________________________________________________________________   Thank you,  Dr. Lajuan Pila

## 2023-10-26 NOTE — Telephone Encounter (Signed)
 [facility] Pre-operative Risk Assessment     Request for surgical clearance:     Endoscopy Procedure  What type of surgery is being performed?     EGD   When is this surgery scheduled?     11-19-23  What type of clearance is required ?   Medical  Are there any medications that need to be held prior to surgery and how long? Needs to see if patient is cleared to have procedure of not  Practice name and name of physician performing surgery?      [facility]  What is your office phone and fax number?      Phone- [phone]  Fax- [phone]  Anesthesia type (None, local, MAC, general) ?       MAC   Please route your response to [employer], [facility])

## 2023-10-26 NOTE — Telephone Encounter (Signed)
 Med rec and consent done     Patient Consent for Virtual Visit        Emily Mays has provided verbal consent on 10/26/2023 for a virtual visit (video or telephone).   CONSENT FOR VIRTUAL VISIT FOR:  Emily Mays  By participating in this virtual visit I agree to the following:  I hereby voluntarily request, consent and authorize [facility] and its employed or contracted physicians, physician assistants, nurse practitioners or other licensed health care professionals (the Practitioner), to provide me with telemedicine health care services (the "Services) as deemed necessary by the treating Practitioner. I acknowledge and consent to receive the Services by the Practitioner via telemedicine. I understand that the telemedicine visit will involve communicating with the Practitioner through live audiovisual communication technology and the disclosure of certain medical information by electronic transmission. I acknowledge that I have been given the opportunity to request an in-person assessment or other available alternative prior to the telemedicine visit and am voluntarily participating in the telemedicine visit.  I understand that I have the right to withhold or withdraw my consent to the use of telemedicine in the course of my care at any time, without affecting my right to future care or treatment, and that the Practitioner or I may terminate the telemedicine visit at any time. I understand that I have the right to inspect all information obtained and/or recorded in the course of the telemedicine visit and may receive copies of available information for a reasonable fee.  I understand that some of the potential risks of receiving the Services via telemedicine include:  Delay or interruption in medical evaluation due to technological equipment failure or disruption; Information transmitted may not be sufficient (e.g. poor resolution of images) to allow for appropriate medical  decision making by the Practitioner; and/or  In rare instances, security protocols could fail, causing a breach of personal health information.  Furthermore, I acknowledge that it is my responsibility to provide information about my medical history, conditions and care that is complete and accurate to the best of my ability. I acknowledge that Practitioner's advice, recommendations, and/or decision may be based on factors not within their control, such as incomplete or inaccurate data provided by me or distortions of diagnostic images or specimens that may result from electronic transmissions. I understand that the practice of medicine is not an exact science and that Practitioner makes no warranties or guarantees regarding treatment outcomes. I acknowledge that a copy of this consent can be made available to me via my patient portal [facility] MyChart), or I can request a printed copy by calling the office of [facility].    I understand that my insurance will be billed for this visit.   I have read or had this consent read to me. I understand the contents of this consent, which adequately explains the benefits and risks of the Services being provided via telemedicine.  I have been provided ample opportunity to ask questions regarding this consent and the Services and have had my questions answered to my satisfaction. I give my informed consent for the services to be provided through the use of telemedicine in my medical care

## 2023-10-31 ENCOUNTER — Encounter

## 2023-11-05 ENCOUNTER — Ambulatory Visit: Attending: Internal Medicine

## 2023-11-05 DIAGNOSIS — Z01810 Encounter for preprocedural cardiovascular examination: Secondary | ICD-10-CM | POA: Diagnosis not present

## 2023-11-05 NOTE — Progress Notes (Signed)
 Virtual Visit via Telephone Note   Because of Emily Mays co-morbid illnesses, she is at least at moderate risk for complications without adequate follow up.  This format is felt to be most appropriate for this patient at this time.  Due to technical limitations with video connection (technology), today's appointment will be conducted as an audio only telehealth visit, and Emily Mays verbally agreed to proceed in this manner.   All issues noted in this document were discussed and addressed.  No physical exam could be performed with this format.  Evaluation Performed:  Preoperative cardiovascular risk assessment _____________   Date:  11/05/2023   Patient ID:  Emily Mays, DOB [birth year], MRN [id number] Patient Location:  Home Provider location:   Office  Primary Care Provider:  Pia Kerney SQUIBB, MD Primary Cardiologist:  Ozell Fell, MD  Chief Complaint / Patient Profile   63 y.o. y/o female with a h/o essential hypertension, coronary artery disease, ischemic cardiomyopathy, chronic combined systolic and diastolic CHF who is pending EGD and presents today for telephonic preoperative cardiovascular risk assessment.  History of Present Illness    Emily Mays is a 63 y.o. female who presents via audio/video conferencing for a telehealth visit today.  Pt was last seen in cardiology clinic on 05/28/2023 by Dr. Fell.  At that time Emily Mays was doing well .  The patient is now pending procedure as outlined above. Since her last visit, she continues to be stable from a cardiac standpoint.  Today she denies chest pain, shortness of breath, lower extremity edema, fatigue, palpitations, melena, hematuria, hemoptysis, diaphoresis, weakness, presyncope, syncope, orthopnea, and PND.   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 s/p MI 07/24/08 with PCI Endeavor DES to Prox. LAD and BMA to prox. LCX.. relook cath 07/28/08 showing cont. patency of LAD to LCX.   Cancer [facility])    uterine cancer   Chronic systolic HF (heart failure) (HCC)    COPD (chronic obstructive pulmonary disease) (HCC)    no inhaler   DDD (degenerative disc disease)    Diabetes mellitus without complication (HCC)    type 2   Esophageal reflux    GERD (gastroesophageal reflux disease)    HLD (hyperlipidemia)    Hypokalemia    Myocardial infarction [facility])    Other specified forms of chronic ischemic heart disease    echo 07/26/2008 EfF 30% with perapical HK   Paralytic ileus (HCC)    Pneumonia    x 1   Unspecified essential hypertension    Past Surgical History:  Procedure Laterality Date   ABDOMINAL HYSTERECTOMY     BILATERAL SALPINGOOPHORECTOMY     CARDIAC CATHETERIZATION  07/2008   2 cardiac stent placement in March 2010.    CARDIAC DEFIBRILLATOR PLACEMENT  04/2009   St. Jude   CHOLECYSTECTOMY     27 years ago   COLONOSCOPY  12/06/2006   Small internalhemorrhoids. Otherwise normal colonoscopy to terminal ileum   ESOPHAGOGASTRODUODENOSCOPY  10/18/2006   Small hiatal hernia. Mild gastritis. Retained food liley due to previous p.o. intake. There was no evidence of gastric outlet obstruction   IMPLANTABLE CARDIOVERTER DEFIBRILLATOR GENERATOR CHANGE N/A 05/16/2013   Generator change (SJM Fortify Assura VR) for prior device malfunction (capacitors unable to charge)  left pelvic lymphadenectomy     by Dr. Toribio Percy    TOTAL ABDOMINAL HYSTERECTOMY W/ BILATERAL SALPINGOOPHORECTOMY     WISDOM TOOTH EXTRACTION      Allergies  Allergies  Allergen Reactions   Duloxetine Hcl Other (See Comments)    Made heart race   Penicillins Hives    Home Medications    Prior to Admission medications   Medication Sig Start Date End Date Taking? Authorizing Provider   Accu-Chek FastClix Lancets MISC USE TO CHECK BLOOD SUGAR LEVELS THREE TIMES A DAY (DX: E11.65) for 90 days    [provider]  aspirin 81 MG chewable tablet 1 tablet Orally Once a day    [provider]  atorvastatin  (LIPITOR) 40 MG tablet TAKE ONE TABLET BY MOUTH EVERY DAY 04/14/22   Wonda Sharper, MD  Black Cohosh 40 MG CAPS Take 40 mg by mouth 2 (two) times daily.    [provider]  Blood Glucose Monitoring Suppl (PHARMACIST CHOICE AUTOCODE SYS) w/Device KIT Check BG Once a day (DX: E11.65) 11/12/20   [provider]  carvedilol  (COREG ) 6.25 MG tablet TAKE ONE TABLET BY MOUTH 2 TIMES A DAY WITH FOOD 07/26/23   Wonda Sharper, MD  Cholecalciferol (VITAMIN D3) 125 MCG (5000 UT) CHEW Chew 1 tablet by mouth daily.    [provider]  clobetasol cream (TEMOVATE) 0.05 %     [provider]  Cyanocobalamin  (VITAMIN B12) 1000 MCG TBCR 1 tablet Orally Once a day for 30 day(s) 11/02/21   [provider]  digoxin  (LANOXIN ) 0.125 MG tablet Take 1 tablet (125 mcg total) by mouth daily. 04/14/22   Wonda Sharper, MD  FARXIGA 10 MG TABS tablet Take 10 mg by mouth daily. 11/27/14   [provider]  furosemide  (LASIX ) 20 MG tablet TAKE 3 TABLETS BY MOUTH DAILY 07/26/23   Wonda Sharper, MD  glipiZIDE (GLUCOTROL XL) 10 MG 24 hr tablet Take 1 tablet by mouth daily with breakfast. 08/06/15   [provider]  glucose blood test strip CHECK BLOOD GLUCOSE IN VITRO THREE TIMES A DAY (DX: E11.65) 90 DAYS 12/19/22   [provider]  hydrOXYzine (ATARAX/VISTARIL) 25 MG tablet Take 25 mg by mouth every 8 (eight) hours as needed for anxiety.  05/09/17   [provider]  ibuprofen (ADVIL) 800 MG tablet Take 800 mg by mouth 3 (three) times daily. 09/05/21   [provider]  insulin degludec (TRESIBA FLEXTOUCH) 100 UNIT/ML FlexTouch Pen inject 32 units 10/01/20   [provider]  Insulin Pen Needle 32G X 6 MM MISC 1  Dose by Does not apply route once a week.    [provider]  nitroGLYCERIN  (NITROSTAT ) 0.4 MG SL tablet Place 1 tablet (0.4 mg total) under the tongue every 5 (five) minutes as needed for chest pain (up to 3 doses MAX). 05/28/23   Wonda Sharper, MD  ondansetron  (ZOFRAN ) 4 MG tablet Take 1 tablet (4 mg total) by mouth every 8 (eight) hours as needed for nausea or vomiting. 10/26/23   Charlanne Groom, MD  oxyCODONE -acetaminophen  (PERCOCET) 10-325 MG per tablet Take 1 tablet by mouth every 6 (six) hours as needed for pain.  11/14/14   [provider]  pantoprazole (PROTONIX) 40 MG tablet Take 40 mg by mouth daily.    [provider]  pioglitazone (ACTOS) 30 MG tablet Take 1 tablet by mouth daily.    [provider]  potassium chloride  SA (KLOR-CON  M) 20 MEQ tablet Take  1 tablet (20 mEq total) by mouth 2 (two) times daily. 05/11/22   Cooper, Michael, MD  pregabalin (LYRICA) 75 MG capsule Take 75 mg by mouth 2 (two) times daily.    [provider]  promethazine (PHENERGAN) 25 MG tablet Take 25 mg by mouth 2 (two) times daily as needed. 08/29/21   [provider]  REGLAN 5 MG tablet 1 tablet before meals Orally 4 times a day for 90 days    [provider]  Semaglutide,0.25 or 0.5MG /DOS, (OZEMPIC, 0.25 OR 0.5 MG/DOSE,) 2 MG/1.5ML SOPN Inject 0.25 mg into the skin once a week.    [provider]  tiZANidine (ZANAFLEX) 4 MG tablet Take 4 mg by mouth every 6 (six) hours as needed for muscle spasms.    [provider]  Vitamin D, Ergocalciferol, (DRISDOL) 50000 units CAPS capsule Take 50,000 Units by mouth 2 (two) times a week. 06/05/17   [provider]    Physical Exam    Vital Signs:  Emily Mays does not have vital signs available for review today.  Given telephonic nature of communication, physical exam is limited. AAOx3. NAD. Normal affect.  Speech and respirations are unlabored.  Accessory Clinical Findings     None  Assessment & Plan    1.  Preoperative Cardiovascular Risk Assessment: [facility], EGD, fax #[phone]      Primary Cardiologist: Ozell Fell, MD  Chart reviewed as part of pre-operative protocol coverage. Given past medical history and time since last visit, based on ACC/AHA guidelines, Emily Mays would be at acceptable risk for the planned procedure without further cardiovascular testing.   Patient was advised that if she develops new symptoms prior to surgery to contact our office to arrange a follow-up appointment.  He verbalized understanding.  Regarding ASA therapy, we recommend continuation of ASA throughout the perioperative period. However, if the surgeon feels that cessation of ASA is required in the perioperative period, it may be stopped 5-7 days prior to surgery with a plan to resume it as soon as felt to be feasible from a surgical standpoint in the post-operative period.   I will route this recommendation to the requesting party via Epic fax function and remove from pre-op pool.       Time:   Today, I have spent 5 minutes with the patient with telehealth technology discussing medical history, symptoms, and management plan.  I spent 10 minutes reviewing patient's past cardiac history and cardiac medications.    Josefa CHRISTELLA Beauvais, NP  11/05/2023, 6:56 AM

## 2023-11-07 NOTE — Telephone Encounter (Signed)
 Patient has been cleared to have procedure and she aware about her diabetic medications and that the hospital will call her

## 2023-11-12 ENCOUNTER — Encounter (HOSPITAL_COMMUNITY): Payer: Self-pay | Admitting: Gastroenterology

## 2023-11-13 ENCOUNTER — Telehealth: Payer: Self-pay

## 2023-11-13 NOTE — Telephone Encounter (Signed)
 Procedure:EGD Procedure date: 11/19/23 Procedure location: [facility] Arrival Time: 9:45 am Spoke with the patient Y/N: y Any prep concerns? n  Has the patient obtained the prep from the pharmacy ? n Do you have a care partner and transportation: y Any additional concerns? n

## 2023-11-19 ENCOUNTER — Ambulatory Visit (HOSPITAL_COMMUNITY)
Admission: RE | Admit: 2023-11-19 | Discharge: 2023-11-19 | Disposition: A | Discharge status: Home / Self Care | Attending: Gastroenterology | Admitting: Gastroenterology

## 2023-11-19 ENCOUNTER — Encounter (HOSPITAL_COMMUNITY): Payer: Self-pay | Admitting: Gastroenterology

## 2023-11-19 ENCOUNTER — Other Ambulatory Visit: Payer: Self-pay

## 2023-11-19 ENCOUNTER — Ambulatory Visit (HOSPITAL_COMMUNITY): Admitting: Anesthesiology

## 2023-11-19 ENCOUNTER — Ambulatory Visit (HOSPITAL_BASED_OUTPATIENT_CLINIC_OR_DEPARTMENT_OTHER): Admitting: Anesthesiology

## 2023-11-19 ENCOUNTER — Encounter (HOSPITAL_COMMUNITY)
Admission: RE | Disposition: A | Discharge status: Home / Self Care | Payer: Self-pay | Source: Home / Self Care | Attending: Gastroenterology

## 2023-11-19 DIAGNOSIS — I5022 Chronic systolic (congestive) heart failure: Secondary | ICD-10-CM | POA: Diagnosis not present

## 2023-11-19 DIAGNOSIS — Z79899 Other long term (current) drug therapy: Secondary | ICD-10-CM | POA: Diagnosis not present

## 2023-11-19 DIAGNOSIS — E1143 Type 2 diabetes mellitus with diabetic autonomic (poly)neuropathy: Secondary | ICD-10-CM | POA: Diagnosis not present

## 2023-11-19 DIAGNOSIS — Z7984 Long term (current) use of oral hypoglycemic drugs: Secondary | ICD-10-CM | POA: Insufficient documentation

## 2023-11-19 DIAGNOSIS — E7800 Pure hypercholesterolemia, unspecified: Secondary | ICD-10-CM | POA: Insufficient documentation

## 2023-11-19 DIAGNOSIS — J449 Chronic obstructive pulmonary disease, unspecified: Secondary | ICD-10-CM | POA: Diagnosis not present

## 2023-11-19 DIAGNOSIS — I2510 Atherosclerotic heart disease of native coronary artery without angina pectoris: Secondary | ICD-10-CM | POA: Diagnosis not present

## 2023-11-19 DIAGNOSIS — Z95810 Presence of automatic (implantable) cardiac defibrillator: Secondary | ICD-10-CM | POA: Insufficient documentation

## 2023-11-19 DIAGNOSIS — Z9049 Acquired absence of other specified parts of digestive tract: Secondary | ICD-10-CM | POA: Diagnosis not present

## 2023-11-19 DIAGNOSIS — Z794 Long term (current) use of insulin: Secondary | ICD-10-CM | POA: Diagnosis not present

## 2023-11-19 DIAGNOSIS — K219 Gastro-esophageal reflux disease without esophagitis: Secondary | ICD-10-CM | POA: Insufficient documentation

## 2023-11-19 DIAGNOSIS — I110 Hypertensive heart disease with heart failure: Secondary | ICD-10-CM

## 2023-11-19 DIAGNOSIS — M47816 Spondylosis without myelopathy or radiculopathy, lumbar region: Secondary | ICD-10-CM | POA: Insufficient documentation

## 2023-11-19 DIAGNOSIS — Z7985 Long-term (current) use of injectable non-insulin antidiabetic drugs: Secondary | ICD-10-CM | POA: Diagnosis not present

## 2023-11-19 DIAGNOSIS — K3189 Other diseases of stomach and duodenum: Secondary | ICD-10-CM | POA: Diagnosis not present

## 2023-11-19 DIAGNOSIS — R1013 Epigastric pain: Secondary | ICD-10-CM | POA: Diagnosis present

## 2023-11-19 DIAGNOSIS — K3184 Gastroparesis: Secondary | ICD-10-CM | POA: Diagnosis not present

## 2023-11-19 DIAGNOSIS — K449 Diaphragmatic hernia without obstruction or gangrene: Secondary | ICD-10-CM | POA: Insufficient documentation

## 2023-11-19 DIAGNOSIS — I252 Old myocardial infarction: Secondary | ICD-10-CM | POA: Insufficient documentation

## 2023-11-19 DIAGNOSIS — Z87891 Personal history of nicotine dependence: Secondary | ICD-10-CM | POA: Insufficient documentation

## 2023-11-19 DIAGNOSIS — Z7982 Long term (current) use of aspirin: Secondary | ICD-10-CM | POA: Diagnosis not present

## 2023-11-19 DIAGNOSIS — I5042 Chronic combined systolic (congestive) and diastolic (congestive) heart failure: Secondary | ICD-10-CM

## 2023-11-19 HISTORY — PX: ESOPHAGOGASTRODUODENOSCOPY: SHX5428

## 2023-11-19 LAB — GLUCOSE, CAPILLARY: Glucose-Capillary: 171 mg/dL — ABNORMAL HIGH (ref 70–99)

## 2023-11-19 SURGERY — EGD (ESOPHAGOGASTRODUODENOSCOPY)
Anesthesia: Monitor Anesthesia Care

## 2023-11-19 MED ORDER — SODIUM CHLORIDE 0.9 % IV SOLN: INTRAVENOUS | Status: DC

## 2023-11-19 MED ORDER — SODIUM CHLORIDE 0.9 % IV SOLN: INTRAVENOUS | Status: DC | PRN

## 2023-11-19 MED ORDER — PHENYLEPHRINE HCL (PRESSORS) 10 MG/ML IV SOLN
INTRAVENOUS | Status: DC | PRN
Start: 2023-11-19 — End: 2023-11-19
  Administered 2023-11-19: 40 ug via INTRAVENOUS

## 2023-11-19 MED ORDER — PROPOFOL 500 MG/50ML IV EMUL
INTRAVENOUS | Status: DC | PRN
Start: 2023-11-19 — End: 2023-11-19
  Administered 2023-11-19: 50 mg via INTRAVENOUS
  Administered 2023-11-19: 150 ug/kg/min via INTRAVENOUS

## 2023-11-19 NOTE — Transfer of Care (Signed)
 Immediate Anesthesia Transfer of Care Note  Patient: Emily Mays  Procedure(s) Performed: EGD (ESOPHAGOGASTRODUODENOSCOPY)  Patient Location: PACU  Anesthesia Type:MAC  Level of Consciousness: awake, alert , oriented, and patient cooperative  Airway & Oxygen Therapy: Patient Spontanous Breathing and Patient connected to face mask oxygen  Post-op Assessment: Report given to RN and Post -op Vital signs reviewed and stable  Post vital signs: Reviewed and stable  Last Vitals:  Vitals Value Taken Time  BP    Temp    Pulse 81 11/19/23 11:55  Resp 12 11/19/23 11:55  SpO2 99 % 11/19/23 11:55  Vitals shown include unfiled device data.  Last Pain:  Vitals:   11/19/23 1057  TempSrc: Temporal  PainSc: 0-No pain      Patients Stated Pain Goal: 0 (11/19/23 1057)  Complications: No notable events documented.

## 2023-11-19 NOTE — Anesthesia Preprocedure Evaluation (Addendum)
 Anesthesia Evaluation  Patient identified by MRN, date of birth, ID band Patient awake    Reviewed: Allergy & Precautions, NPO status , Patient's Chart, lab work & pertinent test results, reviewed documented beta blocker date and time   History of Anesthesia Complications Negative for: history of anesthetic complications  Airway Mallampati: II  TM Distance: >3 FB Neck ROM: Full    Dental  (+) Dental Advisory Given, Missing   Pulmonary COPD, former smoker   Pulmonary exam normal        Cardiovascular hypertension, Pt. on home beta blockers and Pt. on medications + CAD, + Past MI, + Cardiac Stents and +CHF  Normal cardiovascular exam+ Cardiac Defibrillator    '23 TTE - EF 40-45%. Severe hypokinesis/akinesis of anteroseptal, distal septal, distal anterior, and apical walls. Moderately dilated LA. Mild TR.     Neuro/Psych negative neurological ROS  negative psych ROS   GI/Hepatic Neg liver ROS,GERD  Medicated and Controlled,,  Endo/Other  diabetes, Type 2, Oral Hypoglycemic Agents, Insulin Dependent    Renal/GU negative Renal ROS     Musculoskeletal negative musculoskeletal ROS (+)    Abdominal   Peds  Hematology negative hematology ROS (+)   Anesthesia Other Findings On GLP-1a   Reproductive/Obstetrics                              Anesthesia Physical Anesthesia Plan  ASA: 3  Anesthesia Plan: MAC   Post-op Pain Management: Minimal or no pain anticipated   Induction:   PONV Risk Score and Plan: 2 and Propofol  infusion and Treatment may vary due to age or medical condition  Airway Management Planned: Nasal Cannula and Natural Airway  Additional Equipment: None  Intra-op Plan:   Post-operative Plan:   Informed Consent: I have reviewed the patients History and Physical, chart, labs and discussed the procedure including the risks, benefits and alternatives for the proposed  anesthesia with the patient or authorized representative who has indicated his/her understanding and acceptance.       Plan Discussed with: CRNA and Anesthesiologist  Anesthesia Plan Comments:          Anesthesia Quick Evaluation

## 2023-11-19 NOTE — Interval H&P Note (Signed)
 History and Physical Interval Note:  11/19/2023 11:29 AM  Emily Mays  has presented today for surgery, with the diagnosis of gerd K21.9 Diabetic gastroparesis (HCC) [E11.43, K31.84.  The various methods of treatment have been discussed with the patient and family. After consideration of risks, benefits and other options for treatment, the patient has consented to  Procedure(s): EGD (ESOPHAGOGASTRODUODENOSCOPY) (N/A) as a surgical intervention.  The patient's history has been reviewed, patient examined, no change in status, stable for surgery.  I have reviewed the patient's chart and labs.  Questions were answered to the patient's satisfaction.     Lynnie Bring

## 2023-11-19 NOTE — Discharge Instructions (Signed)

## 2023-11-19 NOTE — Op Note (Signed)
 [facility] Patient Name: Emily Mays Procedure Date: 11/19/2023 MRN: [id number] Attending MD: Lynnie Bring , MD, [id number] Date of Birth: 1961-03-22 CSN: [id number] Age: 63 Admit Type: Outpatient Procedure:                Upper GI endoscopy Indications:              Epigastric abdominal pain Providers:                Lynnie Bring, MD, Gregoria Pierce, RN, Curtistine Bishop, Technician Referring MD:              Medicines:                Monitored Anesthesia Care Complications:            No immediate complications. Estimated Blood Loss:     Estimated blood loss: none. Procedure:                Pre-Anesthesia Assessment:                           - Prior to the procedure, a History and Physical                            was performed, and patient medications and                            allergies were reviewed. The patient's tolerance of                            previous anesthesia was also reviewed. The risks                            and benefits of the procedure and the sedation                            options and risks were discussed with the patient.                            All questions were answered, and informed consent                            was obtained. Prior Anticoagulants: The patient has                            taken no anticoagulant or antiplatelet agents. ASA                            Grade Assessment: III - A patient with severe                            systemic disease. After reviewing the risks and  benefits, the patient was deemed in satisfactory                            condition to undergo the procedure.                           After obtaining informed consent, the endoscope was                            passed under direct vision. Throughout the                            procedure, the patient's blood pressure, pulse, and                            oxygen  saturations were monitored continuously. The                            GIF-H190 ([id number]) Olympus endoscope was introduced                            through the mouth, and advanced to the second part                            of duodenum. The upper GI endoscopy was                            accomplished without difficulty. The patient                            tolerated the procedure well. Scope In: Scope Out: Findings:      The examined esophagus was normal.      The Z-line was regular and was found 38 cm from the incisors.      A minimal hiatal hernia was present. Best visualized on Valsalva.      The entire examined stomach was normal. Stomach was J-shaped. Biopsies       were taken with a cold forceps for histology.      The examined duodenum was normal. Biopsies for histology were taken with       a cold forceps for evaluation of celiac disease. Impression:               - Small transient hiatal hernia.                           - Otherwise normal EGD. Stomach was J-shaped. Moderate Sedation:      Not Applicable - Patient had care per Anesthesia. Recommendation:           - Patient has a contact number available for                            emergencies. The signs and symptoms of potential                            delayed complications were discussed with the  patient. Return to normal activities tomorrow.                            Written discharge instructions were provided to the                            patient.                           - Resume previous diet.                           - Continue present medications.                           - Await pathology results.                           - If still with problems, solid-phase gastric                            emptying scan.                           - The findings and recommendations were discussed                            with the patient's family. Procedure Code(s):        ---  Professional ---                           [phone], Esophagogastroduodenoscopy, flexible,                            transoral; with biopsy, single or multiple Diagnosis Code(s):        --- Professional ---                           K44.9, Diaphragmatic hernia without obstruction or                            gangrene                           R10.13, Epigastric pain CPT copyright 2022 [employer]. All rights reserved. The codes documented in this report are preliminary and upon coder review may  be revised to meet current compliance requirements. Lynnie Bring, MD 11/19/2023 11:54:32 AM This report has been signed electronically. Number of Addenda: 0

## 2023-11-19 NOTE — Anesthesia Postprocedure Evaluation (Signed)
 Anesthesia Post Note  Patient: Emily Mays  Procedure(s) Performed: EGD (ESOPHAGOGASTRODUODENOSCOPY)     Patient location during evaluation: PACU Anesthesia Type: MAC Level of consciousness: awake and alert Pain management: pain level controlled Vital Signs Assessment: post-procedure vital signs reviewed and stable Respiratory status: spontaneous breathing, nonlabored ventilation and respiratory function stable Cardiovascular status: stable and blood pressure returned to baseline Anesthetic complications: no   No notable events documented.  Last Vitals:  Vitals:   11/19/23 1220 11/19/23 1225  BP: (!) 162/84   Pulse: 80 75  Resp: 13 16  Temp:    SpO2: 98% 98%    Last Pain:  Vitals:   11/19/23 1225  TempSrc:   PainSc: 0-No pain                 Debby FORBES Like

## 2023-11-20 ENCOUNTER — Encounter (HOSPITAL_COMMUNITY): Payer: Self-pay | Admitting: Gastroenterology

## 2023-11-20 LAB — SURGICAL PATHOLOGY

## 2023-11-21 ENCOUNTER — Ambulatory Visit: Payer: Self-pay | Admitting: Gastroenterology

## 2024-01-30 ENCOUNTER — Ambulatory Visit (INDEPENDENT_AMBULATORY_CARE_PROVIDER_SITE_OTHER)

## 2024-01-30 DIAGNOSIS — I255 Ischemic cardiomyopathy: Secondary | ICD-10-CM | POA: Diagnosis not present

## 2024-02-04 ENCOUNTER — Ambulatory Visit: Payer: Self-pay | Admitting: Cardiovascular Disease

## 2024-02-04 LAB — CUP PACEART REMOTE DEVICE CHECK
Battery Remaining Longevity: 24 mo
Battery Remaining Percentage: 23 %
Battery Voltage: 2.8 V
Brady Statistic RV Percent Paced: 1 %
Date Time Interrogation Session: 20250919130501
HighPow Impedance: 73 Ohm
HighPow Impedance: 73 Ohm
Lead Channel Impedance Value: 690 Ohm
Lead Channel Pacing Threshold Amplitude: 1 V
Lead Channel Pacing Threshold Pulse Width: 0.5 ms
Lead Channel Sensing Intrinsic Amplitude: 12 mV
Lead Channel Setting Pacing Amplitude: 2.5 V
Lead Channel Setting Pacing Pulse Width: 0.5 ms
Lead Channel Setting Sensing Sensitivity: 0.5 mV
Pulse Gen Serial Number: 7152745
Zone Setting Status: 755011

## 2024-02-05 NOTE — Progress Notes (Signed)
 Remote PPM Transmission

## 2024-04-30 ENCOUNTER — Encounter

## 2024-05-02 ENCOUNTER — Ambulatory Visit

## 2024-05-02 DIAGNOSIS — I255 Ischemic cardiomyopathy: Secondary | ICD-10-CM

## 2024-05-07 LAB — CUP PACEART REMOTE DEVICE CHECK
Battery Remaining Longevity: 24 mo
Battery Remaining Percentage: 22 %
Battery Voltage: 2.78 V
Brady Statistic RV Percent Paced: 1 %
Date Time Interrogation Session: 20251224005025
HighPow Impedance: 73 Ohm
HighPow Impedance: 73 Ohm
Lead Channel Impedance Value: 800 Ohm
Lead Channel Pacing Threshold Amplitude: 1 V
Lead Channel Pacing Threshold Pulse Width: 0.5 ms
Lead Channel Sensing Intrinsic Amplitude: 12 mV
Lead Channel Setting Pacing Amplitude: 2.5 V
Lead Channel Setting Pacing Pulse Width: 0.5 ms
Lead Channel Setting Sensing Sensitivity: 0.5 mV
Pulse Gen Serial Number: 7152745
Zone Setting Status: 755011

## 2024-05-07 NOTE — Progress Notes (Signed)
 Remote PPM Transmission

## 2024-05-14 ENCOUNTER — Ambulatory Visit: Payer: Self-pay | Admitting: Cardiovascular Disease

## 2024-05-21 ENCOUNTER — Ambulatory Visit: Admitting: Physician Assistant

## 2024-05-22 ENCOUNTER — Encounter: Payer: Self-pay | Admitting: Student

## 2024-05-22 ENCOUNTER — Ambulatory Visit: Attending: Student | Admitting: Student

## 2024-05-22 VITALS — BP 128/72 | HR 90 | Ht 64.0 in | Wt 139.0 lb

## 2024-05-22 DIAGNOSIS — I2510 Atherosclerotic heart disease of native coronary artery without angina pectoris: Secondary | ICD-10-CM

## 2024-05-22 DIAGNOSIS — I255 Ischemic cardiomyopathy: Secondary | ICD-10-CM

## 2024-05-22 DIAGNOSIS — I5022 Chronic systolic (congestive) heart failure: Secondary | ICD-10-CM

## 2024-05-22 LAB — CUP PACEART INCLINIC DEVICE CHECK
Battery Remaining Longevity: 24 mo
Brady Statistic RV Percent Paced: 0 %
Date Time Interrogation Session: 20260108101122
HighPow Impedance: 72 Ohm
Implantable Lead Connection Status: 753985
Implantable Lead Implant Date: 20101228
Implantable Lead Location: 753860
Implantable Lead Model: 7122
Implantable Pulse Generator Implant Date: 20150102
Lead Channel Impedance Value: 825 Ohm
Lead Channel Pacing Threshold Amplitude: 1 V
Lead Channel Pacing Threshold Amplitude: 1 V
Lead Channel Pacing Threshold Pulse Width: 0.5 ms
Lead Channel Pacing Threshold Pulse Width: 0.5 ms
Lead Channel Sensing Intrinsic Amplitude: 12 mV
Lead Channel Setting Pacing Amplitude: 2.5 V
Lead Channel Setting Pacing Pulse Width: 0.5 ms
Lead Channel Setting Sensing Sensitivity: 0.5 mV
Pulse Gen Serial Number: 7152745
Zone Setting Status: 755011

## 2024-05-22 NOTE — Progress Notes (Signed)
" °  Electrophysiology Office Note:   ID:  [name], [name] [birth year], MRN [id number]  Primary Cardiologist: Ozell Fell, MD Electrophysiologist: Eulas FORBES Furbish, MD      History of Present Illness:   Emily Mays is a 64 y.o. female with h/o Chronic systolic CHF and ICM s/p ICD seen today for routine electrophysiology followup.   Since last being seen in our clinic the patient reports doing general malaise x 3 weeks, and was worried there was something wrong with her device. Otherwise, she denies chest pain, palpitations, dyspnea, PND, orthopnea, nausea, vomiting, dizziness, syncope, edema, weight gain, or early satiety.   Review of systems complete and found to be negative unless listed in HPI.   EP Information / Studies Reviewed:    EKG is not ordered today. EKG from 07/06/2023 reviewed which showed NSR at 84 bpm       ICD Interrogation-  reviewed in detail today,  See PACEART report.  Arrhythmia/Device History SJM single chamber ICD implanted 05/11/2009, gen change 2015  (gen change done 2/2 capacitors could not change at that the device was malfunctioning) at the time of her gen change report notes  SJM 7122-65 Durata lead was thoroughly evaluated with Cine in the AP position (5 minutes required).  There was no evidence for wire extrusion from the lead.  The lead was satisfactory and stable    Physical Exam:   VS:  There were no vitals taken for this visit.   Wt Readings from Last 3 Encounters:  11/19/23 141 lb (64 kg)  10/26/23 142 lb (64.4 kg)  07/06/23 142 lb (64.4 kg)     GEN: No acute distress  NECK: No JVD; No carotid bruits CARDIAC: Regular rate and rhythm, no murmurs, rubs, gallops RESPIRATORY:  Clear to auscultation without rales, wheezing or rhonchi  ABDOMEN: Soft, non-tender, non-distended EXTREMITIES:  No edema; No deformity   ASSESSMENT AND PLAN:    Chronic systolic CHF  s/p Abbott single chamber ICD  euvolemic today Stable on an appropriate  medical regimen Normal ICD function See Pace Art report No changes today  CAD No s/s of ischemia.      Disposition:   Follow up with EP Team in 12 months   Signed, Ozell Prentice Passey, PA-C  "

## 2024-05-22 NOTE — Patient Instructions (Signed)
 Medication Instructions:  No medication changes today. *If you need a refill on your cardiac medications before your next appointment, please call your pharmacy*  Lab Work: No labwork ordered today. If you have labs (blood work) drawn today and your tests are completely normal, you will receive your results only by: MyChart Message (if you have MyChart) OR A paper copy in the mail If you have any lab test that is abnormal or we need to change your treatment, we will call you to review the results.  Testing/Procedures: No testing ordered today  Follow-Up: At [facility], you and your health needs are our priority.  As part of our continuing mission to provide you with exceptional heart care, our providers are all part of one team.  This team includes your primary Cardiologist (physician) and Advanced Practice Providers or APPs (Physician Assistants and Nurse Practitioners) who all work together to provide you with the care you need, when you need it.  Your next appointment:   12 month(s)  Provider:   You may see Efraim Grange, MD or one of the following Advanced Practice Providers on your designated Care Team:   Mertha Abrahams, [place] [address] [place], PA-C Suzann Riddle, NP Creighton Doffing, NP    We recommend signing up for the patient portal called "MyChart".  Sign up information is provided on this After Visit Summary.  MyChart is used to connect with patients for Virtual Visits (Telemedicine).  Patients are able to view lab/test results, encounter notes, upcoming appointments, etc.  Non-urgent messages can be sent to your provider as well.   To learn more about what you can do with MyChart, go to [email or web address].

## 2024-05-24 ENCOUNTER — Ambulatory Visit: Payer: Self-pay | Admitting: Cardiovascular Disease

## 2024-07-30 ENCOUNTER — Encounter

## 2024-08-01 ENCOUNTER — Encounter

## 2024-10-29 ENCOUNTER — Encounter

## 2024-10-31 ENCOUNTER — Encounter

## 2025-01-28 ENCOUNTER — Encounter

## 2025-01-30 ENCOUNTER — Encounter
